# Patient Record
Sex: Male | Born: 1970 | Race: White | Hispanic: No | Marital: Married | Smoking: Current every day smoker
Health system: Southern US, Community
[De-identification: ages and names within clinical notes are randomized; demographics above are authoritative.]

## PROBLEM LIST (undated history)

## (undated) DIAGNOSIS — R7401 Elevation of levels of liver transaminase levels: Secondary | ICD-10-CM

## (undated) DIAGNOSIS — B1920 Unspecified viral hepatitis C without hepatic coma: Secondary | ICD-10-CM

## (undated) DIAGNOSIS — Z720 Tobacco use: Secondary | ICD-10-CM

## (undated) DIAGNOSIS — F1010 Alcohol abuse, uncomplicated: Secondary | ICD-10-CM

## (undated) DIAGNOSIS — R740 Nonspecific elevation of levels of transaminase and lactic acid dehydrogenase [LDH]: Secondary | ICD-10-CM

## (undated) DIAGNOSIS — R569 Unspecified convulsions: Secondary | ICD-10-CM

## (undated) HISTORY — PX: CARPAL TUNNEL RELEASE: SHX101

---

## 2007-05-11 ENCOUNTER — Emergency Department: Payer: Self-pay | Admitting: Emergency Medicine

## 2007-11-30 ENCOUNTER — Emergency Department: Payer: Self-pay | Admitting: Emergency Medicine

## 2011-06-09 ENCOUNTER — Emergency Department: Payer: Self-pay

## 2011-06-09 LAB — URINALYSIS, COMPLETE
Bacteria: NONE SEEN
Bilirubin,UR: NEGATIVE
Ketone: NEGATIVE
Ph: 5 (ref 4.5–8.0)
RBC,UR: 2 /HPF (ref 0–5)
Squamous Epithelial: NONE SEEN
WBC UR: <1 /HPF (ref 0–5)

## 2011-06-09 LAB — COMPREHENSIVE METABOLIC PANEL
Alkaline Phosphatase: 85 U/L (ref 50–136)
Anion Gap: 10 (ref 7–16)
Bilirubin,Total: 0.7 mg/dL (ref 0.2–1.0)
Calcium, Total: 8.6 mg/dL (ref 8.5–10.1)
Chloride: 103 mmol/L (ref 98–107)
Co2: 25 mmol/L (ref 21–32)
Creatinine: 0.93 mg/dL (ref 0.60–1.30)
EGFR (African American): >60
EGFR (Non-African Amer.): >60
Osmolality: 279 (ref 275–301)
SGPT (ALT): 58 U/L
Sodium: 138 mmol/L (ref 136–145)

## 2011-06-09 LAB — CBC
HCT: 36.7 % — ABNORMAL LOW (ref 40.0–52.0)
HGB: 12.9 g/dL — ABNORMAL LOW (ref 13.0–18.0)
MCH: 31.9 pg (ref 26.0–34.0)
MCHC: 35.2 g/dL (ref 32.0–36.0)
Platelet: 210 10*3/uL (ref 150–440)

## 2011-06-09 LAB — DRUG SCREEN, URINE
Amphetamines, Ur Screen: NEGATIVE (ref ?–1000)
Barbiturates, Ur Screen: NEGATIVE (ref ?–200)
Benzodiazepine, Ur Scrn: NEGATIVE (ref ?–200)
Cannabinoid 50 Ng, Ur ~~LOC~~: NEGATIVE (ref ?–50)
Methadone, Ur Screen: NEGATIVE (ref ?–300)
Opiate, Ur Screen: NEGATIVE (ref ?–300)
Phencyclidine (PCP) Ur S: NEGATIVE (ref ?–25)

## 2011-06-09 LAB — ETHANOL
Ethanol %: <0.003 % (ref 0.000–0.080)
Ethanol: <3 mg/dL

## 2011-08-30 ENCOUNTER — Emergency Department: Payer: Self-pay | Admitting: Emergency Medicine

## 2011-08-30 LAB — DRUG SCREEN, URINE
Amphetamines, Ur Screen: NEGATIVE (ref ?–1000)
Benzodiazepine, Ur Scrn: NEGATIVE (ref ?–200)
MDMA (Ecstasy)Ur Screen: NEGATIVE (ref ?–500)
Opiate, Ur Screen: NEGATIVE (ref ?–300)
Phencyclidine (PCP) Ur S: NEGATIVE (ref ?–25)
Tricyclic, Ur Screen: NEGATIVE (ref ?–1000)

## 2011-08-30 LAB — COMPREHENSIVE METABOLIC PANEL
Albumin: 4.2 g/dL (ref 3.4–5.0)
Anion Gap: 11 (ref 7–16)
Calcium, Total: 9.2 mg/dL (ref 8.5–10.1)
Co2: 19 mmol/L — ABNORMAL LOW (ref 21–32)
Creatinine: 0.85 mg/dL (ref 0.60–1.30)
Glucose: 123 mg/dL — ABNORMAL HIGH (ref 65–99)
Potassium: 4.1 mmol/L (ref 3.5–5.1)
SGPT (ALT): 80 U/L — ABNORMAL HIGH
Sodium: 133 mmol/L — ABNORMAL LOW (ref 136–145)
Total Protein: 8.8 g/dL — ABNORMAL HIGH (ref 6.4–8.2)

## 2011-08-30 LAB — CBC
HGB: 14.9 g/dL (ref 13.0–18.0)
MCH: 31.3 pg (ref 26.0–34.0)
MCHC: 34.7 g/dL (ref 32.0–36.0)
Platelet: 223 10*3/uL (ref 150–440)
WBC: 6.2 10*3/uL (ref 3.8–10.6)

## 2011-08-30 LAB — ETHANOL: Ethanol %: <0.003 % (ref 0.000–0.080)

## 2011-08-30 LAB — ACETAMINOPHEN LEVEL: Acetaminophen: <2 ug/mL

## 2011-08-30 LAB — TSH: Thyroid Stimulating Horm: 4.02 u[IU]/mL

## 2012-09-23 ENCOUNTER — Emergency Department: Payer: Self-pay | Admitting: Urology

## 2012-10-07 ENCOUNTER — Emergency Department: Payer: Self-pay | Admitting: Emergency Medicine

## 2012-10-13 ENCOUNTER — Emergency Department: Payer: Self-pay | Admitting: Emergency Medicine

## 2012-10-13 LAB — URINALYSIS, COMPLETE
Bacteria: NONE SEEN
Bilirubin,UR: NEGATIVE
Glucose,UR: NEGATIVE mg/dL (ref 0–75)
Ketone: NEGATIVE
Ph: 6 (ref 4.5–8.0)
Protein: NEGATIVE
RBC,UR: <1 /HPF (ref 0–5)

## 2012-10-13 LAB — COMPREHENSIVE METABOLIC PANEL
Alkaline Phosphatase: 144 U/L — ABNORMAL HIGH (ref 50–136)
Anion Gap: 9 (ref 7–16)
BUN: 10 mg/dL (ref 7–18)
Bilirubin,Total: 0.6 mg/dL (ref 0.2–1.0)
Chloride: 104 mmol/L (ref 98–107)
EGFR (Non-African Amer.): >60
Glucose: 121 mg/dL — ABNORMAL HIGH (ref 65–99)
Osmolality: 267 (ref 275–301)
Potassium: 5 mmol/L (ref 3.5–5.1)
SGOT(AST): 112 U/L — ABNORMAL HIGH (ref 15–37)
Sodium: 133 mmol/L — ABNORMAL LOW (ref 136–145)
Total Protein: 8.6 g/dL — ABNORMAL HIGH (ref 6.4–8.2)

## 2012-10-13 LAB — ETHANOL: Ethanol: 283 mg/dL

## 2012-10-13 LAB — DRUG SCREEN, URINE
Amphetamines, Ur Screen: NEGATIVE (ref ?–1000)
Benzodiazepine, Ur Scrn: NEGATIVE (ref ?–200)
Cocaine Metabolite,Ur ~~LOC~~: NEGATIVE (ref ?–300)
Methadone, Ur Screen: NEGATIVE (ref ?–300)
Opiate, Ur Screen: NEGATIVE (ref ?–300)
Phencyclidine (PCP) Ur S: NEGATIVE (ref ?–25)
Tricyclic, Ur Screen: NEGATIVE (ref ?–1000)

## 2012-10-13 LAB — CBC
HCT: 41.5 % (ref 40.0–52.0)
HGB: 14.8 g/dL (ref 13.0–18.0)
MCH: 31.5 pg (ref 26.0–34.0)
MCHC: 35.5 g/dL (ref 32.0–36.0)
Platelet: 244 10*3/uL (ref 150–440)
RBC: 4.69 10*6/uL (ref 4.40–5.90)
RDW: 13.7 % (ref 11.5–14.5)
WBC: 8.3 10*3/uL (ref 3.8–10.6)

## 2014-09-22 NOTE — Consult Note (Signed)
PATIENT NAME:  Joel Tanner, Joel Tanner MR#:  [id number] DATE OF BIRTH:  1971/02/16  DATE OF CONSULTATION:  06/09/2011  REFERRING PHYSICIAN:  Maurilio Lovely, MD  CONSULTING PHYSICIAN:  Jackquline Branca A. Lafayette Dragon, MD PRIMARY CARE PHYSICIAN: None.   REASON FOR CONSULTATION: Transient confusion and some tingling for 2 seconds.   HISTORY OF PRESENT ILLNESS: The patient is a 44 year old white male with a past medical history of alcohol abuse. He drinks 6 to 12 beers a night. He last drank two days ago. Yesterday at around 1:30 p.m., he woke up and had slight confusion when he woke up for a few minutes and then subsequently had a little bit of tingling in the back of his head and the back of his leg for about 2 seconds. He drank 12 beers on 06/07/2011, commonly drinks about a sixpack. He had no alcohol yesterday or today.  He was brought in by his mother. He had mildly elevated blood pressure. He was given aspirin, had a head CT. We are asked to see the patient for possible transient ischemic attacks.   PAST MEDICAL HISTORY: None.   PAST SURGICAL HISTORY: Carpal tunnel repair on the right hand.   HOME MEDICATIONS:  None.   ALLERGIES: No known drug allergies.   FAMILY HISTORY: Both his mother and father are living.   SOCIAL HISTORY: He is single, but he has two healthy children. He drinks 6 to 12 beers a day. He smokes about 1 pack of cigarettes a day. He is unemployed, used to be a Engineer, petroleum.   REVIEW OF SYSTEMS: CONSTITUTIONAL: No fever, fatigue, weakness, or pain. EYES: No blurred vision, double vision, pain, redness, inflammation, glaucoma. ENT: No tinnitus, ear pain, hearing loss, seasonal allergies, epistaxis, or discharge. RESPIRATORY: No cough, wheezing, hemoptysis, dyspnea, asthma, painful respirations. CARDIOVASCULAR: No chest pain, orthopnea, edema, arrhythmia, dyspnea on exertion, palpitations. GI: No nausea, vomiting, diarrhea, abdominal pain. No hematemesis, melena, or GERD.  GU: No dysuria, hematuria, renal calculi, increased frequency, or incontinence. GU male. No sores, discharge, prostatitis or erectile dysfunction. ENDOCRINE: No polyuria, nocturia, thyroid problems, increased sweating, heat or cold intolerance. HEME/LYMPH: No anemia, easy bruising, or swollen glands. INTEGUMENT: No acne, rash, lesions, change in mole, hair or skin. MUSCULOSKELETAL: No pain in back, shoulder, knee, hip, arthritis, swelling, gout. NEUROLOGIC: No numbness, weakness, dysarthria, epilepsy, tremor, vertigo, ataxia. PSYCH: No anxiety, insomnia, ADD, bipolar, or depression.   PHYSICAL EXAMINATION:  VITAL SIGNS: Temperature 97.7, heart rate 80, respiratory rate 18, sating 98% on room air, blood pressure 142/90.   GENERAL: The patient is well-developed, well-nourished, in no apparent distress, alert and oriented x3.   HEENT: Pupils are equal, round and reactive to light and accommodation. Extraocular movements are intact. Anicteric sclerae. No difficulty hearing. Oropharynx is clear.   NECK: No JVD. No thyromegaly, no lymphadenopathy. No carotid bruits.   LUNGS: Clear to auscultation. No adventitious breath sounds. No increased work of breathing or use of accessory muscles.   CARDIOVASCULAR: Regular rate and rhythm. Normal S1, S2. No murmurs, gallops, rubs are appreciated.   ABDOMEN: Soft, nontender, nondistended. Positive bowel sounds. No masses.    GU: Deferred.   MUSCULOSKELETAL: Strength five out of five. No clubbing, cyanosis or degenerative joint disease.   SKIN: No rashes, lesions, or induration. He has multiple tattoos.   LYMPH: No lymphadenopathy in the cervical or supraclavicular area.   NEUROLOGICAL: Cranial nerves II through XII are intact. Reflexes +2. Gait steady.   PSYCHIATRIC: Alert and  oriented x3 with flattened affect.   LABORATORY, DIAGNOSTIC AND RADIOLOGICAL DATA:  Glucose 129, BUN 18, creatinine 0.93 sodium 138, potassium 4.2, chloride 103, bicarbonate 25,  anion gap 10. Alcohol level less than 3.0. Total protein 7.6, albumin 3.8, total bilirubin 0.7, alkaline phosphatase 85, ALT 73, AST 58.  WBC 5.2, hemoglobin 12.9, hematocrit 36.7, platelets 210.  Urinalysis shows bacteria none, WBC less than 1.0.   Drug screen negative.   ASSESSMENT AND RECOMMENDATIONS: The patient is a 44 year old white male with past medical history of alcohol abuse who presents with confusion, some transient numbness which occurred yesterday.   1. Confusion, tingling: Doubt this is a transient ischemic attack, but we will get carotid ultrasound. The patient did not want to stay for admission. We will refer to [facility]. We will get carotid ultrasound. He could get echo done as an outpatient. I don't think he warrants MRI at this point.  2. Hypertension: This could be just white coat phenomenon. I would ask [facility] to re-evaluate his blood pressure. I have [facility].  3. Hyperglycemia: This is again probably just stress reaction.  4. Elevated AST: This is probably from chronic alcohol abuse. I counseled him about quitting.  5. Alcohol abuse: I counseled about cessation. The patient does not want to quit at this time.  6. Smoking: The patient was counseled about cessation for three minutes.  7. Anemia: We will monitor.  8. Deep venous thrombosis prophylaxis:  The patient was ambulatory.  9. Disposition: The patient was seen by Case Management and Utilization Review, set up for [facility]. He will be written for aspirin until he sees the [facility].   TOTAL TIME SPENT ON  CONSULTATION: 60 minutes.   Thank you for allowing me to participate in the care of this patient.  ____________________________ Corie ChiquitoAmir A. Lafayette DragonFirozvi, MD aaf:cbb D: 06/09/2011 21:37:51 ET T: 06/10/2011 09:36:18 ET JOB#: [id number] cc: [facility] Coralyn PearAMIR A Londa Mackowski MD ELECTRONICALLY SIGNED 06/11/2011 17:14

## 2014-09-22 NOTE — Consult Note (Signed)
PATIENT NAME:  Joel Tanner, Joel Tanner MR#:  [id number] DATE OF BIRTH:  25-Sep-1970  DATE OF CONSULTATION:  08/30/2011  REFERRING PHYSICIAN:  Dr. Margarita GrizzleWoodruff  CONSULTING PHYSICIAN:  Venida JarvisWilliam James Rashonda Warrior II, MD  REASON FOR CONSULTATION: Consult requested by Dr. Margarita GrizzleWoodruff for what sounds like a confused state.   CHIEF COMPLAINT: "I drank too much yesterday."   HISTORY OF PRESENT ILLNESS: Patient is a 44 year old white single male who early this morning woke up and vomited, started flopping around on the bed, tore it up, urinated on the kitchen floor, stared at the fiancee who he lives with making strange noises with his tongue, would not respond and basically would not wake up. Since coming to the Emergency Room he was initially combative, was brought here by the police and now has recovered from the intoxication and is no longer combative. He reports to me drinking a sixpack of beer per day but yesterday he drank more. Only other medication he takes is melatonin at night. He reports he does have tremors which are a little bit increased in coming off of alcohol but has what sounds like a family tremor all the time but no history of DTs or actual seizures. Patient has no history of depression. He has been to AA in the past with previous detox attempts. He does not want inpatient detox at this time.   No significant medical problems at present. He has been in jail before for not paying child support. He has had one DWI in the past. Uses marijuana about once a month. No other drug history.   Patient has no history of depression.   SOCIAL HISTORY: He lives with his girlfriend. He does work as a Nutritional therapistplumber. He does smoke cigarettes.   Patient again denies suicidal thoughts or history of suicide.   SUICIDE RISK ASSESSMENT: Current attempt negative. Impulsivity positive. Ideation/plan negative. Panic negative. High anxiety terminal negative. Anhedonia negative. Concentration negative. Hopelessness negative. Insomnia  negative. Energy negative. Treatment alliance negative. Illness/pain negative. Prior attempts negative. Substance abuse positive. Family history negative. Childhood abuse negative. Marital status positive. Family/children negative. Firearms: Patient does have a rifle that he hunts with and that is all. Age greater than 65 negative. Gender male. Short-term risk is low.   DIAGNOSES:  AXIS I:  1. Ethanol dependence with delirium secondary to excess alcohol intake on one day. Delirium is now cleared.  2. Alcohol dependence.   AXIS III: Some potential for alcohol withdrawal state.   RECOMMENDATIONS: Patient does not desire any inpatient treatment for withdrawal. He has been through it before on an outpatient basis. Does report that he may go back to AA but really has no desire to stop drinking but realized the excess drinking caused the recent confused state.   PLAN: To release the patient. I would keep him here for 2 or 3 more hours give him Librium if he wants to and give him about 425 mg Librium on discharge to take over the next two days.   ____________________________ Venida JarvisWilliam James Regis Wiland II, MD wjr:cms D: 08/30/2011 13:03:12 ET T: 08/30/2011 13:41:29 ET JOB#: [id number]  cc: Venida JarvisWilliam James Ether Goebel II, MD, <Dictator> Jules HusbandsWILLIAM J Tawania Daponte MD ELECTRONICALLY SIGNED 09/02/2011 14:51

## 2015-07-22 ENCOUNTER — Emergency Department: Payer: Self-pay

## 2015-07-22 ENCOUNTER — Inpatient Hospital Stay
Admission: EM | Admit: 2015-07-22 | Discharge: 2015-07-23 | DRG: 443 | Disposition: A | Discharge status: Home / Self Care | Payer: Self-pay | Attending: Internal Medicine | Admitting: Internal Medicine

## 2015-07-22 ENCOUNTER — Encounter: Payer: Self-pay | Admitting: Emergency Medicine

## 2015-07-22 DIAGNOSIS — K7682 Hepatic encephalopathy: Secondary | ICD-10-CM | POA: Diagnosis present

## 2015-07-22 DIAGNOSIS — K7290 Hepatic failure, unspecified without coma: Secondary | ICD-10-CM | POA: Diagnosis present

## 2015-07-22 DIAGNOSIS — F1010 Alcohol abuse, uncomplicated: Secondary | ICD-10-CM | POA: Diagnosis present

## 2015-07-22 DIAGNOSIS — Z9889 Other specified postprocedural states: Secondary | ICD-10-CM

## 2015-07-22 DIAGNOSIS — K7200 Acute and subacute hepatic failure without coma: Principal | ICD-10-CM | POA: Diagnosis present

## 2015-07-22 DIAGNOSIS — F17210 Nicotine dependence, cigarettes, uncomplicated: Secondary | ICD-10-CM | POA: Diagnosis present

## 2015-07-22 DIAGNOSIS — Z79899 Other long term (current) drug therapy: Secondary | ICD-10-CM

## 2015-07-22 DIAGNOSIS — B1920 Unspecified viral hepatitis C without hepatic coma: Secondary | ICD-10-CM | POA: Diagnosis present

## 2015-07-22 HISTORY — DX: Unspecified convulsions: R56.9

## 2015-07-22 HISTORY — DX: Alcohol abuse, uncomplicated: F10.10

## 2015-07-22 HISTORY — DX: Unspecified viral hepatitis C without hepatic coma: B19.20

## 2015-07-22 HISTORY — DX: Nonspecific elevation of levels of transaminase and lactic acid dehydrogenase (ldh): R74.0

## 2015-07-22 HISTORY — DX: Tobacco use: Z72.0

## 2015-07-22 HISTORY — DX: Elevation of levels of liver transaminase levels: R74.01

## 2015-07-22 LAB — URINE DRUG SCREEN, QUALITATIVE (ARMC ONLY)
AMPHETAMINES, UR SCREEN: NOT DETECTED
Barbiturates, Ur Screen: NOT DETECTED
Benzodiazepine, Ur Scrn: NOT DETECTED
COCAINE METABOLITE, UR ~~LOC~~: NOT DETECTED
Cannabinoid 50 Ng, Ur ~~LOC~~: NOT DETECTED
MDMA (ECSTASY) UR SCREEN: NOT DETECTED
METHADONE SCREEN, URINE: NOT DETECTED
OPIATE, UR SCREEN: NOT DETECTED
PHENCYCLIDINE (PCP) UR S: NOT DETECTED
Tricyclic, Ur Screen: NOT DETECTED

## 2015-07-22 LAB — COMPREHENSIVE METABOLIC PANEL
ALBUMIN: 4.1 g/dL (ref 3.5–5.0)
ALT: 70 U/L — ABNORMAL HIGH (ref 17–63)
ANION GAP: 10 (ref 5–15)
AST: 68 U/L — ABNORMAL HIGH (ref 15–41)
Alkaline Phosphatase: 74 U/L (ref 38–126)
BILIRUBIN TOTAL: 0.7 mg/dL (ref 0.3–1.2)
BUN: 16 mg/dL (ref 6–20)
CO2: 19 mmol/L — ABNORMAL LOW (ref 22–32)
Calcium: 9.3 mg/dL (ref 8.9–10.3)
Chloride: 108 mmol/L (ref 101–111)
Creatinine, Ser: 0.71 mg/dL (ref 0.61–1.24)
GFR calc Af Amer: >60 mL/min (ref >60)
Glucose, Bld: 106 mg/dL — ABNORMAL HIGH (ref 65–99)
POTASSIUM: 3.9 mmol/L (ref 3.5–5.1)
Sodium: 137 mmol/L (ref 135–145)
TOTAL PROTEIN: 8 g/dL (ref 6.5–8.1)

## 2015-07-22 LAB — URINALYSIS COMPLETE WITH MICROSCOPIC (ARMC ONLY)
BACTERIA UA: NONE SEEN
Bilirubin Urine: NEGATIVE
Glucose, UA: NEGATIVE mg/dL
HGB URINE DIPSTICK: NEGATIVE
KETONES UR: NEGATIVE mg/dL
Leukocytes, UA: NEGATIVE
NITRITE: NEGATIVE
PH: 6 (ref 5.0–8.0)
PROTEIN: NEGATIVE mg/dL
RBC / HPF: NONE SEEN RBC/hpf (ref 0–5)
SPECIFIC GRAVITY, URINE: 1.014 (ref 1.005–1.030)
WBC UA: NONE SEEN WBC/hpf (ref 0–5)

## 2015-07-22 LAB — GLUCOSE, CAPILLARY: GLUCOSE-CAPILLARY: 114 mg/dL — AB (ref 65–99)

## 2015-07-22 LAB — CBC
HCT: 36.9 % — ABNORMAL LOW (ref 40.0–52.0)
HEMOGLOBIN: 13 g/dL (ref 13.0–18.0)
MCH: 31.8 pg (ref 26.0–34.0)
MCHC: 35.1 g/dL (ref 32.0–36.0)
MCV: 90.3 fL (ref 80.0–100.0)
PLATELETS: 186 10*3/uL (ref 150–440)
RBC: 4.08 MIL/uL — AB (ref 4.40–5.90)
RDW: 14.7 % — ABNORMAL HIGH (ref 11.5–14.5)
WBC: 5.1 10*3/uL (ref 3.8–10.6)

## 2015-07-22 LAB — RAPID HIV SCREEN (HIV 1/2 AB+AG)
HIV 1/2 ANTIBODIES: NONREACTIVE
HIV-1 P24 ANTIGEN - HIV24: NONREACTIVE

## 2015-07-22 LAB — PROTIME-INR
INR: 1.29
PROTHROMBIN TIME: 16.2 s — AB (ref 11.4–15.0)

## 2015-07-22 LAB — TSH: TSH: 2.242 u[IU]/mL (ref 0.350–4.500)

## 2015-07-22 LAB — ACETAMINOPHEN LEVEL: Acetaminophen (Tylenol), Serum: <10 ug/mL — ABNORMAL LOW (ref 10–30)

## 2015-07-22 LAB — AMMONIA: Ammonia: 136 umol/L — ABNORMAL HIGH (ref 9–35)

## 2015-07-22 LAB — SALICYLATE LEVEL

## 2015-07-22 LAB — ETHANOL: Alcohol, Ethyl (B): 14 mg/dL — ABNORMAL HIGH (ref <5)

## 2015-07-22 MED ORDER — LACTULOSE 10 GM/15ML PO SOLN
30.0000 g | Freq: Once | ORAL | Status: AC
  Administered 2015-07-22: 30 g via ORAL
  Filled 2015-07-22: qty 60

## 2015-07-22 MED ORDER — ONDANSETRON HCL 4 MG/2ML IJ SOLN: 4.0000 mg | Freq: Four times a day (QID) | INTRAMUSCULAR | Status: DC | PRN

## 2015-07-22 MED ORDER — NICOTINE 21 MG/24HR TD PT24
21.0000 mg | MEDICATED_PATCH | Freq: Every day | TRANSDERMAL | Status: DC
  Administered 2015-07-22 – 2015-07-23 (×2): 21 mg via TRANSDERMAL
  Filled 2015-07-22 (×2): qty 1

## 2015-07-22 MED ORDER — LORAZEPAM 1 MG PO TABS: 1.0000 mg | ORAL_TABLET | Freq: Four times a day (QID) | ORAL | Status: DC | PRN

## 2015-07-22 MED ORDER — ACETAMINOPHEN 650 MG RE SUPP: 650.0000 mg | Freq: Four times a day (QID) | RECTAL | Status: DC | PRN

## 2015-07-22 MED ORDER — HEPARIN SODIUM (PORCINE) 5000 UNIT/ML IJ SOLN
5000.0000 [IU] | Freq: Three times a day (TID) | INTRAMUSCULAR | Status: DC
  Administered 2015-07-22 – 2015-07-23 (×3): 5000 [IU] via SUBCUTANEOUS
  Filled 2015-07-22 (×3): qty 1

## 2015-07-22 MED ORDER — SODIUM CHLORIDE 0.9 % IV BOLUS (SEPSIS)
1000.0000 mL | Freq: Once | INTRAVENOUS | Status: AC
Start: 2015-07-22 — End: 2015-07-22
  Administered 2015-07-22: 1000 mL via INTRAVENOUS

## 2015-07-22 MED ORDER — LORAZEPAM 2 MG PO TABS: 0.0000 mg | ORAL_TABLET | Freq: Two times a day (BID) | ORAL | Status: DC

## 2015-07-22 MED ORDER — VITAMIN B-1 100 MG PO TABS
100.0000 mg | ORAL_TABLET | Freq: Every day | ORAL | Status: DC
  Administered 2015-07-22: 100 mg via ORAL
  Filled 2015-07-22 (×2): qty 1

## 2015-07-22 MED ORDER — ALUM & MAG HYDROXIDE-SIMETH 200-200-20 MG/5ML PO SUSP: 30.0000 mL | Freq: Four times a day (QID) | ORAL | Status: DC | PRN

## 2015-07-22 MED ORDER — THIAMINE HCL 100 MG/ML IJ SOLN: 100.0000 mg | Freq: Every day | INTRAMUSCULAR | Status: DC

## 2015-07-22 MED ORDER — LACTULOSE 10 GM/15ML PO SOLN
30.0000 g | Freq: Three times a day (TID) | ORAL | Status: DC
  Administered 2015-07-22 – 2015-07-23 (×3): 30 g via ORAL
  Filled 2015-07-22 (×3): qty 60

## 2015-07-22 MED ORDER — LORAZEPAM 2 MG/ML IJ SOLN: 1.0000 mg | Freq: Four times a day (QID) | INTRAMUSCULAR | Status: DC | PRN

## 2015-07-22 MED ORDER — ACETAMINOPHEN 325 MG PO TABS: 650.0000 mg | ORAL_TABLET | Freq: Four times a day (QID) | ORAL | Status: DC | PRN

## 2015-07-22 MED ORDER — ADULT MULTIVITAMIN W/MINERALS CH
1.0000 | ORAL_TABLET | Freq: Every day | ORAL | Status: DC
  Administered 2015-07-22 – 2015-07-23 (×2): 1 via ORAL
  Filled 2015-07-22 (×2): qty 1

## 2015-07-22 MED ORDER — PNEUMOCOCCAL VAC POLYVALENT 25 MCG/0.5ML IJ INJ
0.5000 mL | INJECTION | INTRAMUSCULAR | Status: AC
  Administered 2015-07-23: 0.5 mL via INTRAMUSCULAR
  Filled 2015-07-22: qty 0.5

## 2015-07-22 MED ORDER — LORAZEPAM 2 MG PO TABS: 0.0000 mg | ORAL_TABLET | Freq: Four times a day (QID) | ORAL | Status: DC

## 2015-07-22 MED ORDER — ONDANSETRON HCL 4 MG PO TABS: 4.0000 mg | ORAL_TABLET | Freq: Four times a day (QID) | ORAL | Status: DC | PRN

## 2015-07-22 MED ORDER — FOLIC ACID 1 MG PO TABS
1.0000 mg | ORAL_TABLET | Freq: Every day | ORAL | Status: DC
  Administered 2015-07-22 – 2015-07-23 (×2): 1 mg via ORAL
  Filled 2015-07-22 (×2): qty 1

## 2015-07-22 MED ORDER — PANTOPRAZOLE SODIUM 40 MG PO TBEC
40.0000 mg | DELAYED_RELEASE_TABLET | Freq: Every day | ORAL | Status: DC
  Administered 2015-07-22 – 2015-07-23 (×2): 40 mg via ORAL
  Filled 2015-07-22 (×2): qty 1

## 2015-07-22 MED ORDER — SENNOSIDES-DOCUSATE SODIUM 8.6-50 MG PO TABS: 1.0000 | ORAL_TABLET | Freq: Every evening | ORAL | Status: DC | PRN

## 2015-07-22 NOTE — Care Management Note (Addendum)
Case Management Note  Patient Details  Name: Joel BLATZ Sr. MRN: [id number] Date of Birth: 12-16-1970  Subjective/Objective:      44yo uninsured Joel Joel Tanner was admitted 07/22/15 with AMS after arriving in the [facility] via EMS. Ammonia level=136, ETOH=283, positive for cannabis. Admits to drinking a 6 pack of beer daily.Hx: Hepatitis C and seizures. CIWA initiated. Case manager will follow up with Joel Tanner for discharge planning tomorrow when he is more alert.              Action/Plan:   Expected Discharge Date:  07/24/15               Expected Discharge Plan:     In-House Referral:     Discharge planning Services     Post Acute Care Choice:    Choice offered to:     DME Arranged:    DME Agency:     HH Arranged:    HH Agency:     Status of Service:     Medicare Important Message Given:    Date Medicare IM Given:    Medicare IM give by:    Date Additional Medicare IM Given:    Additional Medicare Important Message give by:     If discussed at Long Length of Stay Meetings, dates discussed:    Additional Comments:  Shayden Bobier A, RN 07/22/2015, 3:58 PM

## 2015-07-22 NOTE — ED Provider Notes (Signed)
CSN: [id number]     Arrival date & time 07/22/15  1041 History   First MD Initiated Contact with Patient 07/22/15 1145     Chief Complaint  Patient presents with  . Altered Mental Status     (Consider location/radiation/quality/duration/timing/severity/associated sxs/prior Treatment) The history is provided by the patient.  Decarlo Galen Daft Sr. is a 45 y.o. male hx of hep C, seizures not on meds here with confusion. Patient drinks about a sixpack of beer every day. Alcohol use was yesterday. He was supposed to go to work this morning and put on the wrong set of shoes and clothes. He was at work and appears confused and doesn't know what's going on. Patient denies vomiting or fevers. Denies IV drug use, just drinks alcohol daily. Has elevated LFTs but suppose to get workup at [facility]       Past Medical History  Diagnosis Date  . Hepatitis C   . Elevated transaminase level   . Chronic alcohol abuse   . Seizure (HCC)   . Tobacco use    Past Surgical History  Procedure Laterality Date  . Carpal tunnel release     No family history on file. Social History  Substance Use Topics  . Smoking status: Current Every Day Smoker -- 6.00 packs/day    Types: Cigars  . Smokeless tobacco: None  . Alcohol Use: 3.6 oz/week    6 Cans of beer per week     Comment: 6 beers daily    Review of Systems  Psychiatric/Behavioral: Positive for confusion.  All other systems reviewed and are negative.     Allergies  Review of patient's allergies indicates no known allergies.  Home Medications   Prior to Admission medications   Not on File   BP 142/94 mmHg  Pulse 89  Temp(Src) 98.2 F (36.8 C) (Oral)  Resp 20  Ht  (1.753 m)  Wt 160 lb (72.576 kg)  BMI 23.62 kg/m2  SpO2 100% Physical Exam  Constitutional: He is oriented to person, place, and time. He appears well-developed and well-nourished.  Slightly confused   HENT:  Head: Normocephalic.  Mouth/Throat: Oropharynx is clear  and moist.  Eyes: Conjunctivae are normal. Pupils are equal, round, and reactive to light.  Neck: Normal range of motion. Neck supple.  Cardiovascular: Normal rate, regular rhythm and normal heart sounds.   Pulmonary/Chest: Effort normal and breath sounds normal. No respiratory distress. He has no wheezes. He has no rales.  Abdominal: Soft. Bowel sounds are normal. He exhibits no distension. There is no tenderness. There is no rebound.  Musculoskeletal: Normal range of motion. He exhibits no edema or tenderness.  Neurological: He is alert and oriented to person, place, and time.  ? Mild tremors vs mild asterixis. Nl strength throughout.   Skin: Skin is warm and dry.  Psychiatric: He has a normal mood and affect. His behavior is normal. Judgment and thought content normal.  Nursing note and vitals reviewed.   ED Course  Procedures (including critical care time) Labs Review Labs Reviewed  COMPREHENSIVE METABOLIC PANEL - Abnormal; Notable for the following:    CO2 19 (*)    Glucose, Bld 106 (*)    AST 68 (*)    ALT 70 (*)    All other components within normal limits  CBC - Abnormal; Notable for the following:    RBC 4.08 (*)    HCT 36.9 (*)    RDW 14.7 (*)    All other components  within normal limits  GLUCOSE, CAPILLARY - Abnormal; Notable for the following:    Glucose-Capillary 114 (*)    All other components within normal limits  AMMONIA - Abnormal; Notable for the following:    Ammonia 136 (*)    All other components within normal limits  URINALYSIS COMPLETEWITH MICROSCOPIC ([facility] ONLY) - Abnormal; Notable for the following:    Color, Urine YELLOW (*)    APPearance CLEAR (*)    Squamous Epithelial / LPF 0-5 (*)    All other components within normal limits  PROTIME-INR - Abnormal; Notable for the following:    Prothrombin Time 16.2 (*)    All other components within normal limits  URINE DRUG SCREEN, QUALITATIVE ([facility] ONLY)  ETHANOL  SALICYLATE LEVEL  ACETAMINOPHEN LEVEL   CBG MONITORING, ED    Imaging Review Ct Head Wo Contrast  07/22/2015  CLINICAL DATA:  Confusion, altered mental status since this morning when patient to work, last seen normal yesterday, history smoking, hepatitis-C, chronic alcohol abuse EXAM: CT HEAD WITHOUT CONTRAST TECHNIQUE: Contiguous axial images were obtained from the base of the skull through the vertex without intravenous contrast. COMPARISON:  For 06/2011 FINDINGS: Generalized atrophy. Normal ventricular morphology. No midline shift or mass effect. Normal appearance of brain parenchyma. No intracranial hemorrhage, mass lesion, or evidence acute infarction. No extra-axial fluid collections. Mucosal thickening throughout the ethmoid air cells and at the bases of the frontal sinuses. Bones unremarkable. IMPRESSION: No acute intracranial abnormalities. Sinus disease changes as above. Electronically Signed   By: Ulyses Southward M.D.   On: 07/22/2015 12:48   I have personally reviewed and evaluated these images and lab results as part of my medical decision-making.   EKG Interpretation None       ED ECG REPORT I, Danaysha Kirn, the attending physician, personally viewed and interpreted this ECG.   Date: 07/22/2015  EKG Time: 11:15  Rate: 97  Rhythm: normal EKG, normal sinus rhythm  Axis: normal  Intervals:none  ST&T Change: none   MDM   Final diagnoses:  None    Latwan P Solly Sr. is a 45 y.o. male here with confusion. Has hx of elevated LFTs and patient drinks alcohol. Denies other drug use. Vitals stable. Appears to have mild asterxis vs tremors. Will check tox, LFTs, ammonia. Will get CT head.   1:02 PM CT head nl. Ammonia 136. LFTs 60s. Bicarb 19 nl AG. Given lactulose. Will admit.    Richardean Canal, MD 07/22/15 [id number]

## 2015-07-22 NOTE — ED Notes (Signed)
Patient reports intermittent nose bleeds as well.  schaevitz informed.  PTINR informed.

## 2015-07-22 NOTE — ED Notes (Signed)
No droop, drift, or slurred speech noted.  Patient is tremulous.

## 2015-07-22 NOTE — H&P (Signed)
[facility] [place] [facility]   PATIENT NAME: Joel Tanner    MR#:  [id number]  DATE OF BIRTH:  [birth year]  DATE OF ADMISSION:  07/22/2015  PRIMARY CARE PHYSICIAN: No primary care provider on file.   REQUESTING/REFERRING PHYSICIAN: Dr Silverio Lay  CHIEF COMPLAINT:   Confusion HISTORY OF PRESENT ILLNESS:  Joel Tanner  is a 45 y.o. male with a known history of EtOH and tobacco abuse and hepatitis C who presents to above complaint. Patient reports that work he is feeling very confused. He says he was in normal state of health yesterday. He says his last drink was yesterday. He drinks daily 6 cans of beer. He was referred to [facility] for lab work regarding hepatitis C but he states he is not going to follow-up. Ammonia levels greater than 100 here in the emergency room.  PAST MEDICAL HISTORY:   Past Medical History  Diagnosis Date  . Hepatitis C   . Elevated transaminase level   . Chronic alcohol abuse   . Seizure (HCC)   . Tobacco use     PAST SURGICAL HISTORY:   Past Surgical History  Procedure Laterality Date  . Carpal tunnel release      SOCIAL HISTORY:   Social History  Substance Use Topics  . Smoking status: Current Every Day Smoker -- 6.00 packs/day    Types: Cigars  . Smokeless tobacco: No  . Alcohol Use: 3.6 oz/week    6 Cans of beer per week     Comment: 6 beers daily    FAMILY HISTORY:  No history of hypertension or CAD  DRUG ALLERGIES:  No Known Allergies   REVIEW OF SYSTEMS:  CONSTITUTIONAL: No fever, fatigue or weakness.  EYES: No blurred or double vision.  EARS, NOSE, AND THROAT: No tinnitus or ear pain.  RESPIRATORY: No cough, shortness of breath, wheezing or hemoptysis.  CARDIOVASCULAR: No chest pain, orthopnea, edema.  GASTROINTESTINAL: No nausea, vomiting, diarrhea or abdominal pain.  GENITOURINARY: No dysuria, hematuria.  ENDOCRINE: No polyuria, nocturia,  HEMATOLOGY: No anemia, easy bruising or bleeding SKIN: No rash  or lesion. MUSCULOSKELETAL: No joint pain or arthritis.   NEUROLOGIC: No tingling, numbness, weakness. Positive confusion PSYCHIATRY: No anxiety or depression.   MEDICATIONS AT HOME:   Prior to Admission medications   Medication Sig Start Date End Date Taking? Authorizing Provider  omeprazole (PRILOSEC) 20 MG capsule Take 20 mg by mouth daily as needed.   Yes Historical Provider, MD      VITAL SIGNS:  Blood pressure 142/94, pulse 89, temperature 98.2 F (36.8 C), temperature source Oral, resp. rate 20, height  (1.753 m), weight 72.576 kg (160 lb), SpO2 100 %.  PHYSICAL EXAMINATION:  GENERAL:  45 y.o.-year-old patient lying in the bed with no acute distress.  EYES: Pupils equal, round, reactive to light and accommodation. No scleral icterus. Extraocular muscles intact.  HEENT: Head atraumatic, normocephalic. Oropharynx and nasopharynx clear.  NECK:  Supple, no jugular venous distention. No thyroid enlargement, no tenderness.  LUNGS: Normal breath sounds bilaterally, no wheezing, rales,rhonchi or crepitation. No use of accessory muscles of respiration.  CARDIOVASCULAR: S1, S2 normal. No murmurs, rubs, or gallops.  ABDOMEN: Soft, nontender, nondistended. Bowel sounds present. No organomegaly or mass.  EXTREMITIES: No pedal edema, cyanosis, or clubbing.  NEUROLOGIC: Cranial nerves II through XII are grossly intact. No focal deficits. PSYCHIATRIC: The patient is alert and oriented x name and place SKIN: No obvious rash, lesion, or ulcer.   LABORATORY  PANEL:   CBC  Recent Labs Lab 07/22/15 1121  WBC 5.1  HGB 13.0  HCT 36.9*  PLT 186   ------------------------------------------------------------------------------------------------------------------  Chemistries   Recent Labs Lab 07/22/15 1121  NA 137  K 3.9  CL 108  CO2 19*  GLUCOSE 106*  BUN 16  CREATININE 0.71  CALCIUM 9.3  AST 68*  ALT 70*  ALKPHOS 74  BILITOT 0.7    ------------------------------------------------------------------------------------------------------------------  Cardiac Enzymes No results for input(s): TROPONINI in the last 168 hours. ------------------------------------------------------------------------------------------------------------------  RADIOLOGY:  Ct Head Wo Contrast  07/22/2015  CLINICAL DATA:  Confusion, altered mental status since this morning when patient to work, last seen normal yesterday, history smoking, hepatitis-C, chronic alcohol abuse EXAM: CT HEAD WITHOUT CONTRAST TECHNIQUE: Contiguous axial images were obtained from the base of the skull through the vertex without intravenous contrast. COMPARISON:  For 06/2011 FINDINGS: Generalized atrophy. Normal ventricular morphology. No midline shift or mass effect. Normal appearance of brain parenchyma. No intracranial hemorrhage, mass lesion, or evidence acute infarction. No extra-axial fluid collections. Mucosal thickening throughout the ethmoid air cells and at the bases of the frontal sinuses. Bones unremarkable. IMPRESSION: No acute intracranial abnormalities. Sinus disease changes as above. Electronically Signed   By: Ulyses Southward M.D.   On: 07/22/2015 12:48    EKG:    sinus rhythm no ST elevation or depression  IMPRESSION AND PLAN:    45 year old male with a history of EtOH, tobacco abuse and hepatitis C who presents with acute encephalopathy.  1. Acute hepatic encephalopathy: Ammonia levels greater than 100. Patient will need to be treated with lactulose. Ammonia level for a.m.   2. History of hepatitis C: We'll order HCV RNA. Hepatitis panel. Patient will need outpatient GI follow-up after discharge. Also we'll check HIV.  3. Tobacco dependent: Patient is encouraged to stop smoking. Patient counseled for 4 minutes. Patient is 17. Get it sent. Patient would like a nicotine patch.  4. EtOH abuse: Patient is also counseled on cutting down on all call and then  eventually abstinence. CW protocol has been ordered.   All the records are reviewed and case discussed with ED provider. Management plans discussed with the patient and he is in agreement.  CODE STATUS: FULL  TOTAL TIME TAKING CARE OF THIS PATIENT: 45 minutes.    Kegan Mckeithan M.D on 07/22/2015 at 1:31 PM  Between 7am to 6pm - Pager - [phone] After 6pm go to [email or web address] - password [email or web address] [facility]  [place] [facility]  Office  [phone]  CC: Primary care physician; No primary care provider on file.

## 2015-07-22 NOTE — ED Notes (Signed)
Per EMS:  Confusion, altered mental status since this am when patient went to work this am, last seen normal yesterday.  Hx hep c and elevated liver enzymes, chronic daily etoh use.

## 2015-07-23 LAB — COMPREHENSIVE METABOLIC PANEL
ALBUMIN: 3.4 g/dL — AB (ref 3.5–5.0)
ALK PHOS: 69 U/L (ref 38–126)
ALT: 59 U/L (ref 17–63)
AST: 74 U/L — AB (ref 15–41)
BILIRUBIN TOTAL: 0.7 mg/dL (ref 0.3–1.2)
BUN: 18 mg/dL (ref 6–20)
CALCIUM: 8.6 mg/dL — AB (ref 8.9–10.3)
CO2: 18 mmol/L — AB (ref 22–32)
Chloride: 111 mmol/L (ref 101–111)
Creatinine, Ser: 0.8 mg/dL (ref 0.61–1.24)
GFR calc Af Amer: >60 mL/min (ref >60)
GFR calc non Af Amer: >60 mL/min (ref >60)
GLUCOSE: 94 mg/dL (ref 65–99)
Potassium: 3.4 mmol/L — ABNORMAL LOW (ref 3.5–5.1)
SODIUM: UNDETERMINED mmol/L (ref 135–145)
TOTAL PROTEIN: UNDETERMINED g/dL (ref 6.5–8.1)

## 2015-07-23 LAB — HCV RNA QUANT: HCV Quantitative: NOT DETECTED IU/mL (ref >50)

## 2015-07-23 LAB — HEPATITIS PANEL, ACUTE
HCV Ab: 11 s/co ratio — ABNORMAL HIGH (ref 0.0–0.9)
HEP B S AG: NEGATIVE
Hep A IgM: NEGATIVE
Hep B C IgM: NEGATIVE

## 2015-07-23 LAB — AMMONIA: AMMONIA: 63 umol/L — AB (ref 9–35)

## 2015-07-23 MED ORDER — LACTULOSE 10 GM/15ML PO SOLN: 30.0000 g | Freq: Two times a day (BID) | ORAL | Status: DC

## 2015-07-23 MED ORDER — NICOTINE 21 MG/24HR TD PT24: 21.0000 mg | MEDICATED_PATCH | Freq: Every day | TRANSDERMAL | Status: DC

## 2015-07-23 NOTE — Discharge Summary (Signed)
[facility] [place] [facility]   PATIENT NAME: Joel Tanner    MR#:  [id number]  DATE OF BIRTH:  Dec 02, 1970  DATE OF ADMISSION:  07/22/2015 ADMITTING PHYSICIAN: Adrian Saran, MD  DATE OF DISCHARGE: 07/23/2015 10:23 AM  PRIMARY CARE PHYSICIAN: [facility]    ADMISSION DIAGNOSIS:  Hepatic encephalopathy (HCC) [K72.90]  DISCHARGE DIAGNOSIS:  Active Problems:   Hepatic encephalopathy (HCC)   SECONDARY DIAGNOSIS:   Past Medical History  Diagnosis Date  . Hepatitis C   . Elevated transaminase level   . Chronic alcohol abuse   . Seizure (HCC)   . Tobacco use     HOSPITAL COURSE:    45 year old male with a history of EtOH, tobacco abuse and hepatitis C who presents with acute encephalopathy.  1. Acute hepatic encephalopathy: Ammonia levels on admission was > 100. He was treated with lactulose and ammonia level has improved. His mental status is at baseline. 2. History of hepatitis C: Patient will be referred to GI for outpatient follow-up. 3. Tobacco dependent: Patient is encouraged to stop smoking. Patient counseled for 4 minutes on admission.   4. EtOH abuse: Detoxification was uneventful. Patient is encouraged to decrease EtOH use  DISCHARGE CONDITIONS AND DIET:   Patient is stable for discharge on a heart healthy diet  CONSULTS OBTAINED:     DRUG ALLERGIES:  No Known Allergies  DISCHARGE MEDICATIONS:   Discharge Medication List as of 07/23/2015  9:45 AM    START taking these medications   Details  lactulose (CHRONULAC) 10 GM/15ML solution Take 45 mLs (30 g total) by mouth 2 (two) times daily., Starting 07/23/2015, Until Discontinued, Normal    nicotine (NICODERM CQ - DOSED IN MG/24 HOURS) 21 mg/24hr patch Place 1 patch (21 mg total) onto the skin daily., Starting 07/23/2015, Until Discontinued, Normal      CONTINUE these medications which have NOT CHANGED   Details  omeprazole (PRILOSEC) 20 MG capsule Take 20 mg by  mouth daily as needed., Until Discontinued, Historical Med              Today   CHIEF COMPLAINT:  Patient is doing well this morning. Patient mental status is back at baseline   VITAL SIGNS:  Blood pressure 134/74, pulse 74, temperature 97.4 F (36.3 C), temperature source Oral, resp. rate 18, height  (1.753 m), weight 69.99 kg (154 lb 4.8 oz), SpO2 99 %.   REVIEW OF SYSTEMS:  Review of Systems  Constitutional: Negative for fever, chills and malaise/fatigue.  HENT: Negative for sore throat.   Eyes: Negative for blurred vision.  Respiratory: Negative for cough, hemoptysis, shortness of breath and wheezing.   Cardiovascular: Negative for chest pain, palpitations and leg swelling.  Gastrointestinal: Negative for nausea, vomiting, abdominal pain, diarrhea and blood in stool.  Genitourinary: Negative for dysuria.  Musculoskeletal: Negative for back pain.  Neurological: Negative for dizziness, tremors and headaches.  Endo/Heme/Allergies: Does not bruise/bleed easily.     PHYSICAL EXAMINATION:  GENERAL:  45 y.o.-year-old patient lying in the bed with no acute distress.  NECK:  Supple, no jugular venous distention. No thyroid enlargement, no tenderness.  LUNGS: Normal breath sounds bilaterally, no wheezing, rales,rhonchi  No use of accessory muscles of respiration.  CARDIOVASCULAR: S1, S2 normal. No murmurs, rubs, or gallops.  ABDOMEN: Soft, non-tender, non-distended. Bowel sounds present. No organomegaly or mass.  EXTREMITIES: No pedal edema, cyanosis, or clubbing.  PSYCHIATRIC: The patient is alert and oriented x 3.  SKIN:  No obvious rash, lesion, or ulcer.   DATA REVIEW:   CBC  Recent Labs Lab 07/22/15 1121  WBC 5.1  HGB 13.0  HCT 36.9*  PLT 186    Chemistries   Recent Labs Lab 07/23/15 0450  NA NOT CALCULATED  K 3.4*  CL 111  CO2 18*  GLUCOSE 94  BUN 18  CREATININE 0.80  CALCIUM 8.6*  AST 74*  ALT 59  ALKPHOS 69  BILITOT 0.7    Cardiac  Enzymes No results for input(s): TROPONINI in the last 168 hours.  Microbiology Results  @  RADIOLOGY:  Ct Head Wo Contrast  07/22/2015  CLINICAL DATA:  Confusion, altered mental status since this morning when patient to work, last seen normal yesterday, history smoking, hepatitis-C, chronic alcohol abuse EXAM: CT HEAD WITHOUT CONTRAST TECHNIQUE: Contiguous axial images were obtained from the base of the skull through the vertex without intravenous contrast. COMPARISON:  For 06/2011 FINDINGS: Generalized atrophy. Normal ventricular morphology. No midline shift or mass effect. Normal appearance of brain parenchyma. No intracranial hemorrhage, mass lesion, or evidence acute infarction. No extra-axial fluid collections. Mucosal thickening throughout the ethmoid air cells and at the bases of the frontal sinuses. Bones unremarkable. IMPRESSION: No acute intracranial abnormalities. Sinus disease changes as above. Electronically Signed   By: Ulyses Southward M.D.   On: 07/22/2015 12:48      Management plans discussed with the patient and he is in agreement. Stable for discharge home  Patient should follow up with GI 2 weeks  CODE STATUS:     Code Status Orders        Start     Ordered   07/22/15 1501  Full code   Continuous     07/22/15 1500    Code Status History    Date Active Date Inactive Code Status Order ID Comments User Context   This patient has a current code status but no historical code status.      TOTAL TIME TAKING CARE OF THIS PATIENT: 35 minutes.    Note: This dictation was prepared with Dragon dictation along with smaller phrase technology. Any transcriptional errors that result from this process are unintentional.  Nyeema Want M.D on 07/23/2015 at 10:43 AM  Between 7am to 6pm - Pager - [phone] After 6pm go to [email or web address] - password [email or web address] [facility]  [facility]  Office  [phone]  CC: Primary care physician; No primary care provider  on file.

## 2015-07-23 NOTE — Progress Notes (Signed)
Discharge instructions given with verbalized understanding.  VSS.  Patient denies any pain prior to discharge.  Mother present for discharge instructions and has no questions.  Patient taken to visitors entrance in wheelchair to be taken home by mother in personal vehicle.

## 2015-07-23 NOTE — Progress Notes (Signed)
Patient has no acute event overnight. He remained A&O X4 without any sign of alcohol withdrawal. Ammonia level continue to be trending down.

## 2019-02-12 ENCOUNTER — Other Ambulatory Visit: Payer: Self-pay

## 2019-02-12 ENCOUNTER — Ambulatory Visit
Admission: RE | Admit: 2019-02-12 | Discharge: 2019-02-12 | Disposition: A | Discharge status: Home / Self Care | Payer: BC Managed Care – PPO | Source: Ambulatory Visit | Attending: Family Medicine | Admitting: Family Medicine

## 2019-02-12 ENCOUNTER — Encounter (INDEPENDENT_AMBULATORY_CARE_PROVIDER_SITE_OTHER): Payer: Self-pay

## 2019-02-12 ENCOUNTER — Other Ambulatory Visit: Payer: Self-pay | Admitting: Family Medicine

## 2019-02-12 DIAGNOSIS — R1013 Epigastric pain: Secondary | ICD-10-CM | POA: Insufficient documentation

## 2019-03-31 ENCOUNTER — Other Ambulatory Visit: Payer: Self-pay

## 2019-03-31 ENCOUNTER — Encounter: Payer: Self-pay | Admitting: Emergency Medicine

## 2019-03-31 ENCOUNTER — Emergency Department: Payer: BC Managed Care – PPO

## 2019-03-31 ENCOUNTER — Inpatient Hospital Stay
Admission: EM | Admit: 2019-03-31 | Discharge: 2019-04-02 | DRG: 442 | Disposition: A | Discharge status: Home / Self Care | Payer: BC Managed Care – PPO | Attending: Internal Medicine | Admitting: Internal Medicine

## 2019-03-31 DIAGNOSIS — D689 Coagulation defect, unspecified: Secondary | ICD-10-CM | POA: Diagnosis present

## 2019-03-31 DIAGNOSIS — R454 Irritability and anger: Secondary | ICD-10-CM | POA: Diagnosis present

## 2019-03-31 DIAGNOSIS — F17290 Nicotine dependence, other tobacco product, uncomplicated: Secondary | ICD-10-CM | POA: Diagnosis present

## 2019-03-31 DIAGNOSIS — F1210 Cannabis abuse, uncomplicated: Secondary | ICD-10-CM | POA: Diagnosis present

## 2019-03-31 DIAGNOSIS — Z20828 Contact with and (suspected) exposure to other viral communicable diseases: Secondary | ICD-10-CM | POA: Diagnosis present

## 2019-03-31 DIAGNOSIS — E7220 Disorder of urea cycle metabolism, unspecified: Secondary | ICD-10-CM

## 2019-03-31 DIAGNOSIS — K7290 Hepatic failure, unspecified without coma: Secondary | ICD-10-CM

## 2019-03-31 DIAGNOSIS — F1910 Other psychoactive substance abuse, uncomplicated: Secondary | ICD-10-CM | POA: Diagnosis present

## 2019-03-31 DIAGNOSIS — K7200 Acute and subacute hepatic failure without coma: Principal | ICD-10-CM | POA: Diagnosis present

## 2019-03-31 DIAGNOSIS — K7682 Hepatic encephalopathy: Secondary | ICD-10-CM | POA: Diagnosis present

## 2019-03-31 DIAGNOSIS — F1020 Alcohol dependence, uncomplicated: Secondary | ICD-10-CM | POA: Diagnosis present

## 2019-03-31 DIAGNOSIS — B1920 Unspecified viral hepatitis C without hepatic coma: Secondary | ICD-10-CM | POA: Diagnosis present

## 2019-03-31 LAB — CBC
HCT: 38.9 % — ABNORMAL LOW (ref 39.0–52.0)
Hemoglobin: 13.8 g/dL (ref 13.0–17.0)
MCH: 31.4 pg (ref 26.0–34.0)
MCHC: 35.5 g/dL (ref 30.0–36.0)
MCV: 88.4 fL (ref 80.0–100.0)
Platelets: 227 10*3/uL (ref 150–400)
RBC: 4.4 MIL/uL (ref 4.22–5.81)
RDW: 12.4 % (ref 11.5–15.5)
WBC: 10.4 10*3/uL (ref 4.0–10.5)
nRBC: 0 % (ref 0.0–0.2)

## 2019-03-31 LAB — COMPREHENSIVE METABOLIC PANEL
ALT: 50 U/L — ABNORMAL HIGH (ref 0–44)
AST: 53 U/L — ABNORMAL HIGH (ref 15–41)
Albumin: 4.7 g/dL (ref 3.5–5.0)
Alkaline Phosphatase: 83 U/L (ref 38–126)
Anion gap: 13 (ref 5–15)
BUN: 14 mg/dL (ref 6–20)
CO2: 22 mmol/L (ref 22–32)
Calcium: 9.2 mg/dL (ref 8.9–10.3)
Chloride: 101 mmol/L (ref 98–111)
Creatinine, Ser: 0.73 mg/dL (ref 0.61–1.24)
GFR calc Af Amer: >60 mL/min (ref >60)
GFR calc non Af Amer: >60 mL/min (ref >60)
Glucose, Bld: 108 mg/dL — ABNORMAL HIGH (ref 70–99)
Potassium: 3.6 mmol/L (ref 3.5–5.1)
Sodium: 136 mmol/L (ref 135–145)
Total Bilirubin: 1 mg/dL (ref 0.3–1.2)
Total Protein: 8.5 g/dL — ABNORMAL HIGH (ref 6.5–8.1)

## 2019-03-31 LAB — URINALYSIS, ROUTINE W REFLEX MICROSCOPIC
Bilirubin Urine: NEGATIVE
Glucose, UA: NEGATIVE mg/dL
Hgb urine dipstick: NEGATIVE
Ketones, ur: NEGATIVE mg/dL
Leukocytes,Ua: NEGATIVE
Nitrite: NEGATIVE
Protein, ur: NEGATIVE mg/dL
Specific Gravity, Urine: 1.003 — ABNORMAL LOW (ref 1.005–1.030)
pH: 7 (ref 5.0–8.0)

## 2019-03-31 LAB — URINE DRUG SCREEN, QUALITATIVE (ARMC ONLY)
Amphetamines, Ur Screen: NOT DETECTED
Barbiturates, Ur Screen: NOT DETECTED
Benzodiazepine, Ur Scrn: NOT DETECTED
Cannabinoid 50 Ng, Ur ~~LOC~~: POSITIVE — AB
Cocaine Metabolite,Ur ~~LOC~~: NOT DETECTED
MDMA (Ecstasy)Ur Screen: NOT DETECTED
Methadone Scn, Ur: NOT DETECTED
Opiate, Ur Screen: NOT DETECTED
Phencyclidine (PCP) Ur S: NOT DETECTED
Tricyclic, Ur Screen: NOT DETECTED

## 2019-03-31 LAB — PROTIME-INR
INR: 1.3 — ABNORMAL HIGH (ref 0.8–1.2)
Prothrombin Time: 15.6 seconds — ABNORMAL HIGH (ref 11.4–15.2)

## 2019-03-31 LAB — DIFFERENTIAL
Abs Immature Granulocytes: 0.03 10*3/uL (ref 0.00–0.07)
Basophils Absolute: 0.1 10*3/uL (ref 0.0–0.1)
Basophils Relative: 1 %
Eosinophils Absolute: 0.1 10*3/uL (ref 0.0–0.5)
Eosinophils Relative: 1 %
Immature Granulocytes: 0 %
Lymphocytes Relative: 38 %
Lymphs Abs: 3.9 10*3/uL (ref 0.7–4.0)
Monocytes Absolute: 0.6 10*3/uL (ref 0.1–1.0)
Monocytes Relative: 6 %
Neutro Abs: 5.7 10*3/uL (ref 1.7–7.7)
Neutrophils Relative %: 54 %

## 2019-03-31 LAB — AMMONIA: Ammonia: 306 umol/L — ABNORMAL HIGH (ref 9–35)

## 2019-03-31 LAB — GLUCOSE, CAPILLARY: Glucose-Capillary: 102 mg/dL — ABNORMAL HIGH (ref 70–99)

## 2019-03-31 LAB — APTT: aPTT: 48 seconds — ABNORMAL HIGH (ref 24–36)

## 2019-03-31 MED ORDER — LACTULOSE 10 GM/15ML PO SOLN
30.0000 g | Freq: Once | ORAL | Status: AC
  Administered 2019-04-01: 30 g via ORAL
  Filled 2019-03-31: qty 60

## 2019-03-31 MED ORDER — SODIUM CHLORIDE 0.9% FLUSH: 3.0000 mL | Freq: Once | INTRAVENOUS | Status: DC

## 2019-03-31 NOTE — ED Provider Notes (Signed)
[facility] Emergency Department Provider Note   ____________________________________________   First MD Initiated Contact with Patient 03/31/19 2351     (approximate)  I have reviewed the triage vital signs and the nursing notes.   HISTORY  Chief Complaint Altered Mental Status  Level V caveat:   HPI Joel Tanner Sr. is a 48 y.o. male brought to the ED by his wife with a chief complaint of altered mental status/confusion. Wife states patient stopped drinking "cold [place]" 25 days ago.   Then, wife has noted patient with increasing irritability and confusion.  Denies DTs.  Seen by PCP with lab work pending.  Denies fever, cough, chest pain, shortness of breath, abdominal pain, nausea or vomiting.  Denies recent trauma.   Past Medical History:  Diagnosis Date  . Chronic alcohol abuse   . Elevated transaminase level   . Hepatitis C   . Seizure (HCC)   . Tobacco use     Patient Active Problem List   Diagnosis Date Noted  . Hepatic encephalopathy (HCC) 07/22/2015    Past Surgical History:  Procedure Laterality Date  . CARPAL TUNNEL RELEASE      Prior to Admission medications   Medication Sig Start Date End Date Taking? Authorizing Provider  lactulose (CHRONULAC) 10 GM/15ML solution Take 45 mLs (30 g total) by mouth 2 (two) times daily. 07/23/15   Adrian Saran, MD  nicotine (NICODERM CQ - DOSED IN MG/24 HOURS) 21 mg/24hr patch Place 1 patch (21 mg total) onto the skin daily. 07/23/15   Adrian Saran, MD  omeprazole (PRILOSEC) 20 MG capsule Take 20 mg by mouth daily as needed.    [provider]    Allergies Patient has no known allergies.  No family history on file.  Social History Social History   Tobacco Use  . Smoking status: Current Every Day Smoker    Packs/day: 6.00    Types: Cigars  Substance Use Topics  . Alcohol use: Yes    Alcohol/week: 6.0 standard drinks    Types: 6 Cans of beer per week    Comment: 6 beers daily   . Drug use: No    Review of Systems  Constitutional: No fever/chills Eyes: No visual changes. ENT: No sore throat. Cardiovascular: Denies chest pain. Respiratory: Denies shortness of breath. Gastrointestinal: No abdominal pain.  No nausea, no vomiting.  No diarrhea.  No constipation. Genitourinary: Negative for dysuria. Musculoskeletal: Negative for back pain. Skin: Negative for rash. Neurological: Positive for increasing confusion and irritability.  Negative for headaches, focal weakness or numbness.   ____________________________________________   PHYSICAL EXAM:  VITAL SIGNS: ED Triage Vitals  Enc Vitals Group     BP 03/31/19 2023 116/89     Pulse Rate 03/31/19 2023 90     Resp 03/31/19 2023 20     Temp 03/31/19 2023 97.9 F (36.6 C)     Temp src --      SpO2 03/31/19 2023 99 %     Weight 03/31/19 2024 150 lb (68 kg)     Height 03/31/19 2024 5\' 9"  (1.753 m)     Head Circumference --      Peak Flow --      Pain Score 03/31/19 2023 0     Pain Loc --      Pain Edu? --      Excl. in GC? --     Constitutional: Alert and oriented. Well appearing and in mild acute distress. Eyes: Conjunctivae are  normal. PERRL. EOMI. Head: Atraumatic. Nose: No congestion/rhinnorhea. Mouth/Throat: Mucous membranes are moist.  Oropharynx non-erythematous. Neck: No stridor.   Cardiovascular: Normal rate, regular rhythm. Grossly normal heart sounds.  Good peripheral circulation. Respiratory: Normal respiratory effort.  No retractions. Lungs CTAB. Gastrointestinal: Soft and nontender. No distention. No abdominal bruits. No CVA tenderness. Musculoskeletal: No lower extremity tenderness nor edema.  No joint effusions. Neurologic:  Normal speech and language. No gross focal neurologic deficits are appreciated.  Intermittent confusion. Skin:  Skin is warm, dry and intact. No rash noted. Psychiatric: Mood and affect are normal. Speech and behavior are normal.   ____________________________________________   LABS (all labs ordered are listed, but only abnormal results are displayed)  Labs Reviewed  PROTIME-INR - Abnormal; Notable for the following components:      Result Value   Prothrombin Time 15.6 (*)    INR 1.3 (*)    All other components within normal limits  APTT - Abnormal; Notable for the following components:   aPTT 48 (*)    All other components within normal limits  CBC - Abnormal; Notable for the following components:   HCT 38.9 (*)    All other components within normal limits  COMPREHENSIVE METABOLIC PANEL - Abnormal; Notable for the following components:   Glucose, Bld 108 (*)    Total Protein 8.5 (*)    AST 53 (*)    ALT 50 (*)    All other components within normal limits  GLUCOSE, CAPILLARY - Abnormal; Notable for the following components:   Glucose-Capillary 102 (*)    All other components within normal limits  AMMONIA - Abnormal; Notable for the following components:   Ammonia 306 (*)    All other components within normal limits  URINE DRUG SCREEN, QUALITATIVE ([facility] ONLY) - Abnormal; Notable for the following components:   Cannabinoid 50 Ng, Ur Garza POSITIVE (*)    All other components within normal limits  URINALYSIS, ROUTINE W REFLEX MICROSCOPIC - Abnormal; Notable for the following components:   Color, Urine COLORLESS (*)    APPearance CLEAR (*)    Specific Gravity, Urine 1.003 (*)    All other components within normal limits  DIFFERENTIAL  LIPASE, BLOOD  PROTIME-INR  CBG MONITORING, ED   ____________________________________________  EKG  ED ECG REPORT I, Gonzalo Waymire J, the attending physician, personally viewed and interpreted this ECG.   Date: 04/01/2019  EKG Time: 0123  Rate: 77  Rhythm: normal EKG, normal sinus rhythm  Axis: Normal  Intervals:none  ST&T Change: Nonspecific  ____________________________________________  RADIOLOGY  ED MD interpretation: No ICH  Official radiology report(s):  Ct Head Wo Contrast  Result Date: 03/31/2019 CLINICAL DATA:  Altered mental status EXAM: CT HEAD WITHOUT CONTRAST TECHNIQUE: Contiguous axial images were obtained from the base of the skull through the vertex without intravenous contrast. COMPARISON:  CT dated 07/22/2015. FINDINGS: Brain: No evidence of acute infarction, hemorrhage, hydrocephalus, extra-axial collection or mass lesion/mass effect. Vascular: No hyperdense vessel or unexpected calcification. Skull: Normal. Negative for fracture or focal lesion. Sinuses/Orbits: No acute finding. Other: None. IMPRESSION: No acute intracranial abnormality. Electronically Signed   By: Katherine Mantlehristopher  Green M.D.   On: 03/31/2019 21:33    ____________________________________________   PROCEDURES  Procedure(s) performed (including Critical Care):  Procedures   ____________________________________________   INITIAL IMPRESSION / ASSESSMENT AND PLAN / ED COURSE  As part of my medical decision making, I reviewed the following data within the electronic MEDICAL RECORD NUMBER History obtained from family, Nursing notes reviewed and  incorporated, Labs reviewed, EKG interpreted, Old chart reviewed, Radiograph reviewed, Discussed with admitting physician Dr. Posey Pronto and Notes from prior ED visits     Joel Lunger Sr. was evaluated in Emergency Department on 04/01/2019 for the symptoms described in the history of present illness. He was evaluated in the context of the global COVID-19 pandemic, which necessitated consideration that the patient might be at risk for infection with the SARS-CoV-2 virus that causes COVID-19. Institutional protocols and algorithms that pertain to the evaluation of patients at risk for COVID-19 are in a state of rapid change based on information released by regulatory bodies including the CDC and federal and state organizations. These policies and algorithms were followed during the patient's care in the ED.    48 year old male, recently  former alcoholic, brought for increasing confusion. Differential diagnosis includes, but is not limited to, alcohol, illicit or prescription medications, or other toxic ingestion; intracranial pathology such as stroke or intracerebral hemorrhage; fever or infectious causes including sepsis; hypoxemia and/or hypercarbia; uremia; trauma; endocrine related disorders such as diabetes, hypoglycemia, and thyroid-related diseases; hypertensive encephalopathy; etc.  Laboratory results remarkable for elevated ammonia.  Will start lactulose.  Discussed with hospitalist Dr. Posey Pronto who will evaluate patient in emergency department for admission.      ____________________________________________   FINAL CLINICAL IMPRESSION(S) / ED DIAGNOSES  Final diagnoses:  Hyperammonemia ([place])  Hepatic encephalopathy ([place])  Cannabis abuse     ED Discharge Orders    None       Note:  This document was prepared using Dragon voice recognition software and may include unintentional dictation errors.   Paulette Blanch, MD 04/01/19 [phone]

## 2019-03-31 NOTE — ED Notes (Signed)
FIRST NURSE NOTE:  Pt here with wife, states he has been having altered mental status for the past couple of hours.

## 2019-03-31 NOTE — ED Triage Notes (Signed)
Patient noted gait unsteady, speech slurred, grips weak but equal. Smile symmetrical. Wife noted this at 1830. Patient had been out playing golf with friends and was noticed after. States she last saw him normal at 0815 this am. Patient cannot state when these symptoms started.

## 2019-04-01 ENCOUNTER — Other Ambulatory Visit: Payer: Self-pay

## 2019-04-01 DIAGNOSIS — R454 Irritability and anger: Secondary | ICD-10-CM | POA: Diagnosis present

## 2019-04-01 DIAGNOSIS — D689 Coagulation defect, unspecified: Secondary | ICD-10-CM | POA: Diagnosis present

## 2019-04-01 DIAGNOSIS — F1020 Alcohol dependence, uncomplicated: Secondary | ICD-10-CM | POA: Diagnosis present

## 2019-04-01 DIAGNOSIS — K7290 Hepatic failure, unspecified without coma: Secondary | ICD-10-CM

## 2019-04-01 DIAGNOSIS — Z20828 Contact with and (suspected) exposure to other viral communicable diseases: Secondary | ICD-10-CM | POA: Diagnosis present

## 2019-04-01 DIAGNOSIS — F1910 Other psychoactive substance abuse, uncomplicated: Secondary | ICD-10-CM | POA: Diagnosis present

## 2019-04-01 DIAGNOSIS — B1920 Unspecified viral hepatitis C without hepatic coma: Secondary | ICD-10-CM | POA: Diagnosis present

## 2019-04-01 DIAGNOSIS — F1210 Cannabis abuse, uncomplicated: Secondary | ICD-10-CM | POA: Diagnosis present

## 2019-04-01 DIAGNOSIS — K7200 Acute and subacute hepatic failure without coma: Secondary | ICD-10-CM | POA: Diagnosis present

## 2019-04-01 DIAGNOSIS — F17290 Nicotine dependence, other tobacco product, uncomplicated: Secondary | ICD-10-CM | POA: Diagnosis present

## 2019-04-01 LAB — LIPID PANEL
Cholesterol: 181 mg/dL (ref 0–200)
HDL: 33 mg/dL — ABNORMAL LOW (ref >40)
LDL Cholesterol: UNDETERMINED mg/dL (ref 0–99)
Total CHOL/HDL Ratio: 5.5 RATIO
Triglycerides: 843 mg/dL — ABNORMAL HIGH (ref <150)
VLDL: UNDETERMINED mg/dL (ref 0–40)

## 2019-04-01 LAB — PROTIME-INR
INR: 1.3 — ABNORMAL HIGH (ref 0.8–1.2)
Prothrombin Time: 15.9 seconds — ABNORMAL HIGH (ref 11.4–15.2)

## 2019-04-01 LAB — CBC
HCT: 35.2 % — ABNORMAL LOW (ref 39.0–52.0)
Hemoglobin: 12.2 g/dL — ABNORMAL LOW (ref 13.0–17.0)
MCH: 31 pg (ref 26.0–34.0)
MCHC: 34.7 g/dL (ref 30.0–36.0)
MCV: 89.6 fL (ref 80.0–100.0)
Platelets: 206 10*3/uL (ref 150–400)
RBC: 3.93 MIL/uL — ABNORMAL LOW (ref 4.22–5.81)
RDW: 12.8 % (ref 11.5–15.5)
WBC: 5.4 10*3/uL (ref 4.0–10.5)
nRBC: 0 % (ref 0.0–0.2)

## 2019-04-01 LAB — MAGNESIUM: Magnesium: 2.2 mg/dL (ref 1.7–2.4)

## 2019-04-01 LAB — COMPREHENSIVE METABOLIC PANEL
ALT: 42 U/L (ref 0–44)
AST: 42 U/L — ABNORMAL HIGH (ref 15–41)
Albumin: 3.7 g/dL (ref 3.5–5.0)
Alkaline Phosphatase: 64 U/L (ref 38–126)
Anion gap: 10 (ref 5–15)
BUN: 13 mg/dL (ref 6–20)
CO2: 19 mmol/L — ABNORMAL LOW (ref 22–32)
Calcium: 8 mg/dL — ABNORMAL LOW (ref 8.9–10.3)
Chloride: 108 mmol/L (ref 98–111)
Creatinine, Ser: 0.69 mg/dL (ref 0.61–1.24)
GFR calc Af Amer: >60 mL/min (ref >60)
GFR calc non Af Amer: >60 mL/min (ref >60)
Glucose, Bld: 90 mg/dL (ref 70–99)
Potassium: 3.6 mmol/L (ref 3.5–5.1)
Sodium: 137 mmol/L (ref 135–145)
Total Bilirubin: 0.8 mg/dL (ref 0.3–1.2)
Total Protein: 6.9 g/dL (ref 6.5–8.1)

## 2019-04-01 LAB — HIV ANTIBODY (ROUTINE TESTING W REFLEX): HIV Screen 4th Generation wRfx: NONREACTIVE

## 2019-04-01 LAB — PHOSPHORUS: Phosphorus: 3.7 mg/dL (ref 2.5–4.6)

## 2019-04-01 LAB — LDL CHOLESTEROL, DIRECT: Direct LDL: 33.8 mg/dL (ref 0–99)

## 2019-04-01 LAB — LIPASE, BLOOD: Lipase: 56 U/L — ABNORMAL HIGH (ref 11–51)

## 2019-04-01 LAB — VITAMIN B12: Vitamin B-12: 272 pg/mL (ref 180–914)

## 2019-04-01 LAB — ETHANOL: Alcohol, Ethyl (B): 194 mg/dL — ABNORMAL HIGH (ref <10)

## 2019-04-01 LAB — AMMONIA: Ammonia: 52 umol/L — ABNORMAL HIGH (ref 9–35)

## 2019-04-01 LAB — TSH: TSH: 2.845 u[IU]/mL (ref 0.350–4.500)

## 2019-04-01 LAB — SARS CORONAVIRUS 2 (TAT 6-24 HRS): SARS Coronavirus 2: NEGATIVE

## 2019-04-01 MED ORDER — THIAMINE HCL 100 MG/ML IJ SOLN
Freq: Once | INTRAVENOUS | Status: AC
  Administered 2019-04-01: 01:00:00 via INTRAVENOUS
  Filled 2019-04-01: qty 1000

## 2019-04-01 MED ORDER — ONDANSETRON HCL 4 MG PO TABS: 4.0000 mg | ORAL_TABLET | Freq: Four times a day (QID) | ORAL | Status: DC | PRN

## 2019-04-01 MED ORDER — THIAMINE HCL 100 MG/ML IJ SOLN
100.0000 mg | Freq: Every day | INTRAMUSCULAR | Status: DC
  Filled 2019-04-01: qty 2

## 2019-04-01 MED ORDER — FOLIC ACID 1 MG PO TABS
1.0000 mg | ORAL_TABLET | Freq: Every day | ORAL | Status: DC
  Administered 2019-04-01 – 2019-04-02 (×2): 1 mg via ORAL
  Filled 2019-04-01 (×2): qty 1

## 2019-04-01 MED ORDER — ADULT MULTIVITAMIN W/MINERALS CH
1.0000 | ORAL_TABLET | Freq: Every day | ORAL | Status: DC
  Administered 2019-04-01 – 2019-04-02 (×2): 1 via ORAL
  Filled 2019-04-01 (×2): qty 1

## 2019-04-01 MED ORDER — ACETAMINOPHEN 650 MG RE SUPP: 650.0000 mg | Freq: Four times a day (QID) | RECTAL | Status: DC | PRN

## 2019-04-01 MED ORDER — ATORVASTATIN CALCIUM 20 MG PO TABS
40.0000 mg | ORAL_TABLET | Freq: Every day | ORAL | Status: DC
  Administered 2019-04-01: 40 mg via ORAL
  Filled 2019-04-01: qty 2

## 2019-04-01 MED ORDER — SODIUM CHLORIDE 0.9% FLUSH
3.0000 mL | Freq: Two times a day (BID) | INTRAVENOUS | Status: DC
  Administered 2019-04-01 – 2019-04-02 (×2): 3 mL via INTRAVENOUS

## 2019-04-01 MED ORDER — VITAMIN B-1 100 MG PO TABS: 100.0000 mg | ORAL_TABLET | Freq: Every day | ORAL | Status: DC

## 2019-04-01 MED ORDER — LACTULOSE 10 GM/15ML PO SOLN
20.0000 g | Freq: Three times a day (TID) | ORAL | Status: DC
  Administered 2019-04-01 (×2): 20 g via ORAL
  Filled 2019-04-01 (×2): qty 30

## 2019-04-01 MED ORDER — LORAZEPAM 2 MG/ML IJ SOLN: 1.0000 mg | INTRAMUSCULAR | Status: DC | PRN

## 2019-04-01 MED ORDER — LORAZEPAM 2 MG PO TABS
0.0000 mg | ORAL_TABLET | Freq: Four times a day (QID) | ORAL | Status: DC
  Administered 2019-04-01: 2 mg via ORAL
  Filled 2019-04-01: qty 1

## 2019-04-01 MED ORDER — LORAZEPAM 2 MG/ML IJ SOLN: 0.0000 mg | Freq: Four times a day (QID) | INTRAMUSCULAR | Status: DC

## 2019-04-01 MED ORDER — THIAMINE HCL 100 MG/ML IJ SOLN
500.0000 mg | Freq: Once | INTRAVENOUS | Status: AC
  Administered 2019-04-01: 05:00:00 500 mg via INTRAVENOUS
  Filled 2019-04-01: qty 5

## 2019-04-01 MED ORDER — ONDANSETRON HCL 4 MG/2ML IJ SOLN: 4.0000 mg | Freq: Four times a day (QID) | INTRAMUSCULAR | Status: DC | PRN

## 2019-04-01 MED ORDER — ENOXAPARIN SODIUM 40 MG/0.4ML ~~LOC~~ SOLN
40.0000 mg | SUBCUTANEOUS | Status: DC
  Administered 2019-04-01 – 2019-04-02 (×2): 40 mg via SUBCUTANEOUS
  Filled 2019-04-01 (×3): qty 0.4

## 2019-04-01 MED ORDER — LORAZEPAM 1 MG PO TABS: 1.0000 mg | ORAL_TABLET | ORAL | Status: DC | PRN

## 2019-04-01 MED ORDER — LORAZEPAM 2 MG/ML IJ SOLN: 0.0000 mg | Freq: Two times a day (BID) | INTRAMUSCULAR | Status: DC

## 2019-04-01 MED ORDER — ACETAMINOPHEN 325 MG PO TABS: 650.0000 mg | ORAL_TABLET | Freq: Four times a day (QID) | ORAL | Status: DC | PRN

## 2019-04-01 MED ORDER — VITAMIN B-1 100 MG PO TABS
100.0000 mg | ORAL_TABLET | Freq: Every day | ORAL | Status: DC
  Administered 2019-04-01 – 2019-04-02 (×2): 100 mg via ORAL
  Filled 2019-04-01 (×2): qty 1

## 2019-04-01 NOTE — H&P (Signed)
[facility] History and Physical   Patient: Joel RangeJohn P Couse Tanner. MVH:[id number]   PCP: Dortha KernBliss, Laura K, MD DOB: 08/12/1970   DOA: 03/31/2019   DOS: 04/01/2019   DOS: the patient was seen and examined on 04/01/2019  Patient coming from: The patient is coming from Home  Chief Complaint: Confusion  HPI: Joel BlancoJohn P Vokes Tanner. is a 48 y.o. male with Past medical history of alcohol use, hepatitis C, active smoker. Patient presented with confusion.  Wife brought the patient to the hospital because she reports that there has been complete change in patient's personality over last few days.  No fall no trauma no injury reported by the wife mentions that the patient has not been himself. That is increasing irritability as well as confusion as if the patient is actually drunk. No nausea no vomiting no diarrhea.  No fever no chills.  No chest pain abdominal pain overlie the patient. Patient denies any alcohol or drinking.  Patient's last drink was 25 days ago. Wife reports no other substance abuse history. When the patient was drinking alcohol he was drinking 6 beers on a daily basis.  ED Course: Patient presents with confusion.  CT head was unremarkable.  Ammonia level was checked which was 306.  Patient was referred for admission for possible hepatic encephalopathy.  At his baseline ambulates without assistance independent for most of his ADL;  manages his medication on his own.  Review of Systems: as mentioned in the history of present illness.  All other systems reviewed and are negative.  Past Medical History:  Diagnosis Date  . Chronic alcohol abuse   . Elevated transaminase level   . Hepatitis C   . Seizure (HCC)   . Tobacco use    Past Surgical History:  Procedure Laterality Date  . CARPAL TUNNEL RELEASE     Social History:  reports that he has been smoking cigars. He has been smoking about 6.00 packs per day. He does not have any smokeless tobacco history on file. He reports  current alcohol use of about 6.0 standard drinks of alcohol per week. He reports that he does not use drugs.  No Known Allergies  Family history reviewed and not pertinent   Prior to Admission medications   Medication Sig Start Date End Date Taking? Authorizing Provider  lactulose (CHRONULAC) 10 GM/15ML solution Take 45 mLs (30 g total) by mouth 2 (two) times daily. 07/23/15   Adrian SaranMody, Sital, MD  nicotine (NICODERM CQ - DOSED IN MG/24 HOURS) 21 mg/24hr patch Place 1 patch (21 mg total) onto the skin daily. 07/23/15   Adrian SaranMody, Sital, MD  omeprazole (PRILOSEC) 20 MG capsule Take 20 mg by mouth daily as needed.    [provider]    Physical Exam: Vitals:   04/01/19 0027 04/01/19 0130 04/01/19 0230 04/01/19 0245  BP: 134/83 115/73 115/73 101/73  Pulse: 85 66 66 78  Resp: 17 (!) 21  (!) 26  Temp:      SpO2: 98% 98%  98%  Weight:      Height:        General: alert and oriented to time, place, and person. Appear in mild distress, affect emotionally labile Eyes: PERRL, Conjunctiva normal ENT: Oral Mucosa Clear, moist  Neck: no JVD, no Abnormal Mass Or lumps Cardiovascular: S1 and S2 Present, no Murmur, peripheral pulses symmetrical Respiratory: good respiratory effort, Bilateral Air entry equal and Decreased, no signs of accessory muscle use, Clear to Auscultation, no Crackles, no wheezes Abdomen:  Bowel Sound present, Soft and no tenderness, no hernia Skin: no rashes  Extremities: no Pedal edema, no calf tenderness Neurologic: without any new focal findings  No asterixis present. Gait not checked due to patient safety concerns  Data Reviewed: I have personally reviewed and interpreted labs, imaging as discussed below.  CBC: Recent Labs  Lab 03/31/19 2031  WBC 10.4  NEUTROABS 5.7  HGB 13.8  HCT 38.9*  MCV 88.4  PLT 160   Basic Metabolic Panel: Recent Labs  Lab 03/31/19 2031  NA 136  K 3.6  CL 101  CO2 22  GLUCOSE 108*  BUN 14  CREATININE 0.73  CALCIUM 9.2    GFR: Estimated Creatinine Clearance: 106.3 mL/min (by C-G formula based on SCr of 0.73 mg/dL). Liver Function Tests: Recent Labs  Lab 03/31/19 2031  AST 53*  ALT 50*  ALKPHOS 83  BILITOT 1.0  PROT 8.5*  ALBUMIN 4.7   Recent Labs  Lab 03/31/19 2031  LIPASE 56*   Recent Labs  Lab 03/31/19 2151  AMMONIA 306*   Coagulation Profile: Recent Labs  Lab 03/31/19 2031 03/31/19 2354  INR 1.3* 1.3*   Cardiac Enzymes: No results for input(s): CKTOTAL, CKMB, CKMBINDEX, TROPONINI in the last 168 hours. BNP (last 3 results) No results for input(s): PROBNP in the last 8760 hours. HbA1C: No results for input(s): HGBA1C in the last 72 hours. CBG: Recent Labs  Lab 03/31/19 2035  GLUCAP 102*   Lipid Profile: No results for input(s): CHOL, HDL, LDLCALC, TRIG, CHOLHDL, LDLDIRECT in the last 72 hours. Thyroid Function Tests: No results for input(s): TSH, T4TOTAL, FREET4, T3FREE, THYROIDAB in the last 72 hours. Anemia Panel: No results for input(s): VITAMINB12, FOLATE, FERRITIN, TIBC, IRON, RETICCTPCT in the last 72 hours. Urine analysis:    Component Value Date/Time   COLORURINE COLORLESS (A) 03/31/2019 2151   APPEARANCEUR CLEAR (A) 03/31/2019 2151   APPEARANCEUR Clear 10/13/2012 0351   LABSPEC 1.003 (L) 03/31/2019 2151   LABSPEC 1.003 10/13/2012 0351   PHURINE 7.0 03/31/2019 2151   GLUCOSEU NEGATIVE 03/31/2019 2151   GLUCOSEU Negative 10/13/2012 0351   HGBUR NEGATIVE 03/31/2019 2151   BILIRUBINUR NEGATIVE 03/31/2019 2151   BILIRUBINUR Negative 10/13/2012 0351   KETONESUR NEGATIVE 03/31/2019 2151   PROTEINUR NEGATIVE 03/31/2019 2151   NITRITE NEGATIVE 03/31/2019 2151   LEUKOCYTESUR NEGATIVE 03/31/2019 2151   LEUKOCYTESUR Negative 10/13/2012 0351    Radiological Exams on Admission: Ct Head Wo Contrast  Result Date: 03/31/2019 CLINICAL DATA:  Altered mental status EXAM: CT HEAD WITHOUT CONTRAST TECHNIQUE: Contiguous axial images were obtained from the base of the skull  through the vertex without intravenous contrast. COMPARISON:  CT dated 07/22/2015. FINDINGS: Brain: No evidence of acute infarction, hemorrhage, hydrocephalus, extra-axial collection or mass lesion/mass effect. Vascular: No hyperdense vessel or unexpected calcification. Skull: Normal. Negative for fracture or focal lesion. Sinuses/Orbits: No acute finding. Other: None. IMPRESSION: No acute intracranial abnormality. Electronically Signed   By: Constance Holster M.D.   On: 03/31/2019 21:33   I reviewed all nursing notes, pharmacy notes, vitals, pertinent old records.  Assessment/Plan 1. Hepatic encephalopathy ([place]) Presents with confusion although the time of my evaluation the patient is alert awake and oriented x3. Does not have any focal deficit. Does not have any asterixis. CT head unremarkable But his ammonia level is significantly elevated at 306. We will continue with lactulose. Check vitamin B1 level as well as provide 1 dose of IV vitamin B1 500 mg.  2.  Substance abuse Patient has  history of alcohol abuse in the past. Also has tobacco abuse history. UDS is positive for marijuana although the patient denies using any marijuana Or any other substance. Continue CIWA protocol for now protection monitor. Social worker consulted for further assistance.  3.  History of hep C. Recent pancreatitis. Recent ultrasound in September shows evidence of fatty steatosis no evidence of cirrhosis. Monitor.  4.  Mild coagulopathy. In the setting of poor p.o. intake after his recent pancreatitis as well as alcohol abuse. Continue to monitor for now. No evidence of active bleeding.   Nutrition: Regular diet DVT Prophylaxis: Subcutaneous Lovenox  Advance goals of care discussion: Full code   Consults: none  Family Communication: family was present at bedside, at the time of interview.  Opportunity was given to ask question and all questions were answered satisfactorily.  Disposition:  Admitted as inpatient, telemetry unit. Likely to be discharged home, in 2 days.  I have discussed plan of care as described above with RN and patient/family.  Author: Lynden Oxford, MD [facility] 04/01/2019 3:02 AM   To reach On-call, see care teams to locate the attending and reach out to them via [email or web address]. If 7PM-7AM, please contact night-coverage If you still have difficulty reaching the attending provider, please page the [facility] (Director on Call) for [facility] on amion for assistance.

## 2019-04-01 NOTE — Progress Notes (Signed)
Admitted by Dr Posey Pronto this morning.   Seen and examined at bedside.  Patient is remember how he got to the hospital.  Had bowel movements after lactulose.  Confusion appears to be getting better.  Denies any complaints and tells me he has not had a drink in some time but his alcohol level was elevated.  CT of the head was unremarkable.  Initial ammonia level was 300 which trended down to 52.  His vital signs are stable.  He is alert awake oriented X3 but slightly confused/clouded judgment.  Does not yet feel back to his baseline.  Normal sinus rhythm.  Abdomen is nontender nondistended.   CT of the head done this admission is negative  Right upper quadrant ultrasound on 02/12/2019 shows diffuse hepatic steatosis.  Altered mental status secondary to acute hepatic encephalopathy and alcohol intoxication.  Continue alcohol withdrawal protocol.  Lactulose.  Trend ammonia levels, continue neurochecks.  CT of the head negative.  Counseled to quit drinking alcohol.  Hepatic steatosis-lipid panel showed significantly elevated triglycerides and LDL.  Start atorvastatin.  LFTs are relatively normal.  Will advise him to follow-up outpatient with primary care physician for long-term management of this.  Maintain him in the hospital until his mentation status is returned to baseline.  Discussed with RN.  Time spent 20 minutes

## 2019-04-01 NOTE — ED Notes (Signed)
Attempted to call report x 1  

## 2019-04-02 LAB — CBC
HCT: 35.2 % — ABNORMAL LOW (ref 39.0–52.0)
Hemoglobin: 12.1 g/dL — ABNORMAL LOW (ref 13.0–17.0)
MCH: 31.2 pg (ref 26.0–34.0)
MCHC: 34.4 g/dL (ref 30.0–36.0)
MCV: 90.7 fL (ref 80.0–100.0)
Platelets: 180 10*3/uL (ref 150–400)
RBC: 3.88 MIL/uL — ABNORMAL LOW (ref 4.22–5.81)
RDW: 12.5 % (ref 11.5–15.5)
WBC: 6.8 10*3/uL (ref 4.0–10.5)
nRBC: 0 % (ref 0.0–0.2)

## 2019-04-02 LAB — COMPREHENSIVE METABOLIC PANEL
ALT: 56 U/L — ABNORMAL HIGH (ref 0–44)
AST: 95 U/L — ABNORMAL HIGH (ref 15–41)
Albumin: 3.5 g/dL (ref 3.5–5.0)
Alkaline Phosphatase: 76 U/L (ref 38–126)
Anion gap: 7 (ref 5–15)
BUN: 20 mg/dL (ref 6–20)
CO2: 19 mmol/L — ABNORMAL LOW (ref 22–32)
Calcium: 8.5 mg/dL — ABNORMAL LOW (ref 8.9–10.3)
Chloride: 111 mmol/L (ref 98–111)
Creatinine, Ser: 0.87 mg/dL (ref 0.61–1.24)
GFR calc Af Amer: >60 mL/min (ref >60)
GFR calc non Af Amer: >60 mL/min (ref >60)
Glucose, Bld: 100 mg/dL — ABNORMAL HIGH (ref 70–99)
Potassium: 3.6 mmol/L (ref 3.5–5.1)
Sodium: 137 mmol/L (ref 135–145)
Total Bilirubin: 1.3 mg/dL — ABNORMAL HIGH (ref 0.3–1.2)
Total Protein: 6.7 g/dL (ref 6.5–8.1)

## 2019-04-02 LAB — FOLATE RBC
Folate, Hemolysate: 306 ng/mL
Folate, RBC: 836 ng/mL (ref >498)
Hematocrit: 36.6 % — ABNORMAL LOW (ref 37.5–51.0)

## 2019-04-02 LAB — MAGNESIUM: Magnesium: 1.9 mg/dL (ref 1.7–2.4)

## 2019-04-02 MED ORDER — FOLIC ACID 1 MG PO TABS: 1.0000 mg | ORAL_TABLET | Freq: Every day | ORAL | 0 refills | Status: DC

## 2019-04-02 MED ORDER — ATORVASTATIN CALCIUM 40 MG PO TABS: 40.0000 mg | ORAL_TABLET | Freq: Every day | ORAL | 0 refills | Status: DC

## 2019-04-02 MED ORDER — THIAMINE HCL 100 MG PO TABS: 100.0000 mg | ORAL_TABLET | Freq: Every day | ORAL | 0 refills | Status: AC

## 2019-04-02 MED ORDER — LACTULOSE 10 GM/15ML PO SOLN: 10.0000 g | Freq: Two times a day (BID) | ORAL | 0 refills | Status: DC | PRN

## 2019-04-02 NOTE — Evaluation (Signed)
Physical Therapy Evaluation Patient Details Name: Joel REINHARDT Sr. MRN: [id number] DOB: 02-16-1971 Today's Date: 04/02/2019   History of Present Illness  Pt is a 48 y.o. male presenting to hospital 10/31 with AMS/confusion.  Pt admitted with hepatic encephalopathy, h/o substance abuse, and mild coagulopathy.  PMH includes h/o alcohol use, Hep C, seizure, hepatic encephalopathy, and CTR.  Clinical Impression  Prior to hospital admission, pt was independent.  Pt lives with his wife in 1 level home with 3 steps to enter with B railings.  Currently pt is independent with bed mobility, transfers, and ambulation around nursing loop (no AD); modified independent navigating 6 steps with railing.  No loss of balance noted during session's activities.  HR and O2 on room air WFL during session's activities.  Pt appears to be at baseline level of functional mobility; no acute PT needs identified; will sign off.    Follow Up Recommendations No PT follow up    Equipment Recommendations  None recommended by PT    Recommendations for Other Services       Precautions / Restrictions Precautions Precautions: None Restrictions Weight Bearing Restrictions: No      Mobility  Bed Mobility Overal bed mobility: Independent             General bed mobility comments: Semi-supine to sit without any noted difficulties  Transfers Overall transfer level: Independent Equipment used: None             General transfer comment: steady strong transfers noted  Ambulation/Gait Ambulation/Gait assistance: Independent Gait Distance (Feet): 240 Feet Assistive device: None Gait Pattern/deviations: WFL(Within Functional Limits)     General Gait Details: steady ambulation  Stairs Stairs: Yes Stairs assistance: Modified independent (Device/Increase time) Stair Management: One rail Right;Alternating pattern;Forwards Number of Stairs: 6 General stair comments: steady safe stairs negotiation  noted  Wheelchair Mobility    Modified Rankin (Stroke Patients Only)       Balance Overall balance assessment: Independent(No loss of balance with ambulation with head turns R/L.)                                           Pertinent Vitals/Pain Pain Assessment: No/denies pain    Home Living Family/patient expects to be discharged to:: Private residence Living Arrangements: Spouse/significant other Available Help at Discharge: Family Type of Home: House Home Access: Stairs to enter Entrance Stairs-Rails: Right;Left;Can reach both Technical brewer of Steps: 3 Home Layout: One level Home Equipment: Grab bars - tub/shower      Prior Function Level of Independence: Independent         Comments: Pt works as a Development worker, community.  Pt reports no recent h/o falls.     Hand Dominance        Extremity/Trunk Assessment   Upper Extremity Assessment Upper Extremity Assessment: Overall WFL for tasks assessed    Lower Extremity Assessment Lower Extremity Assessment: Overall WFL for tasks assessed    Cervical / Trunk Assessment Cervical / Trunk Assessment: Normal  Communication   Communication: No difficulties  Cognition Arousal/Alertness: Awake/alert Behavior During Therapy: WFL for tasks assessed/performed Overall Cognitive Status: Within Functional Limits for tasks assessed                                 General Comments: Pt oriented to person, place, and time;  overall oriented to current situation but pt reports difficulty remembering events around hospital admission.      General Comments   Nursing cleared pt for participation in physical therapy.  Pt agreeable to PT session.    Exercises     Assessment/Plan    PT Assessment Patent does not need any further PT services  PT Problem List         PT Treatment Interventions      PT Goals (Current goals can be found in the Care Plan section)  Acute Rehab PT Goals Patient  Stated Goal: to go home and get back to working PT Goal Formulation: With patient Time For Goal Achievement: 04/16/19 Potential to Achieve Goals: Good    Frequency     Barriers to discharge        Co-evaluation               AM-PAC PT "6 Clicks" Mobility  Outcome Measure Help needed turning from your back to your side while in a flat bed without using bedrails?: None Help needed moving from lying on your back to sitting on the side of a flat bed without using bedrails?: None Help needed moving to and from a bed to a chair (including a wheelchair)?: None Help needed standing up from a chair using your arms (e.g., wheelchair or bedside chair)?: None Help needed to walk in hospital room?: None Help needed climbing 3-5 steps with a railing? : None 6 Click Score: 24    End of Session Equipment Utilized During Treatment: Gait belt Activity Tolerance: Patient tolerated treatment well Patient left: in chair;with call bell/phone within reach Nurse Communication: Mobility status;Precautions PT Visit Diagnosis: Muscle weakness (generalized) (M62.81)    Time: 1324-4010 PT Time Calculation (min) (ACUTE ONLY): 15 min   Charges:   PT Evaluation $PT Eval Low Complexity: 1 Low          Glade Strausser, PT 04/02/19, 10:29 AM [phone]

## 2019-04-02 NOTE — TOC Transition Note (Signed)
Transition of Care [facility]) - CM/SW Discharge Note   Patient Details  Name: Joel DOBBINS Sr. MRN: [id number] Date of Birth: [birth year]  Transition of Care [facility] CM/SW Contact:  Candie Chroman, LCSW Phone Number: 04/02/2019, 12:04 PM   Clinical Narrative:  CSW met with patient. Wife at bedside. CSW introduced role and inquired about interest in [facility] resources list, which includes counseling and substance abuse treatment. Wife took list and said they will likely follow up with their PCP. No further concerns. Patient has orders to discharge home today. CSW signing off.   Final next level of care: Home/Self Care Barriers to Discharge: Barriers Resolved   Patient Goals and CMS Choice     Choice offered to / list presented to : NA  Discharge Placement                       Discharge Plan and Services     Post Acute Care Choice: NA                               Social Determinants of Health (SDOH) Interventions     Readmission Risk Interventions No flowsheet data found.

## 2019-04-02 NOTE — Progress Notes (Signed)
Iv and telemetry removed due to pt being discharged home. Medications and discharged instructions explained to pt and wife who is at bedside. Pt transported via w/c  Off the unit to be driven home by wife. No distress noted.

## 2019-04-02 NOTE — Plan of Care (Signed)
  Problem: Clinical Measurements: Goal: Will remain free from infection Outcome: Progressing Goal: Diagnostic test results will improve Outcome: Progressing   Problem: Coping: Goal: Level of anxiety will decrease Outcome: Adequate for Discharge   Problem: Pain Managment: Goal: General experience of comfort will improve Outcome: Adequate for Discharge

## 2019-04-02 NOTE — Discharge Summary (Signed)
Physician Discharge Summary  Joel BlancoJohn P Boulais Tanner. ZOX:[id number] DOB: 11/05/1970 DOA: 03/31/2019  PCP: Dortha KernBliss, Laura K, MD  Admit date: 03/31/2019 Discharge date: 04/02/2019  Admitted From: Home Disposition: Home  Recommendations for Outpatient Follow-up:  1. Follow up with PCP in 1-2 weeks 2. Please obtain BMP/CBC in one week your next doctors visit.  3. Atorvastatin 40 mg daily prescribed.  Repeat LFTs in about 2-4 weeks 4. Advised to quit drinking.  Folic acid and thiamine prescribed.  Over-the-counter multivitamins can be taken. 5. Lactulose prescribed for encephalopathy.   Discharge Condition: Stable CODE STATUS: Full code Diet recommendation: 2 g salt  Brief/Interim Summary: 48 y.o. male with Past medical history of alcohol use,  active smoker. Patient presented with confusion.  Upon admission was noted to have ammonia levels greater than 300.  CT of the head was negative.  Rest of the work-up was unremarkable.  Alcohol level was elevated.  He was admitted for hepatic encephalopathy.  Right upper quadrant ultrasound from last month was reviewed which showed hepatic steatosis.  Lipid levels were elevated therefore started on atorvastatin.  His mentation cleared up over the course of hospitalization and returned back to normal.  On the day of discharge I had extensive discussion with and recommended that he needs to quit drinking alcohol, take atorvastatin daily as prescribed and follow-up outpatient with PCP in about 2-4 weeks and get repeat lab work done.  Also spoke with his wife regarding his hospital course.  Stable for discharge today.  Discharge Diagnoses:  Principal Problem:   Hepatic encephalopathy (HCC) Active Problems:   Substance abuse (HCC)   Coagulopathy (HCC)   Alcohol use disorder, moderate, dependence (HCC)   Irritability  Acute hepatic encephalopathy, resolved Elevated alcohol level -Mentation is returned back to normal.  CT of the head is negative.  Ammonia  levels have trended down.  Advised him to quit drinking. -Previous right upper quadrant ultrasound reviewed showing hepatic steatosis, lipid levels elevated therefore started on atorvastatin.  Advised to follow-up outpatient with PCP in about 2-4 weeks and get repeat lab work. -We will prescribe lactulose to be taken as needed for encephalopathy. -Thiamine and folic acid prescribed.  Advised to take daily vitamins over-the-counter.  History of hepatitis C-follow-up outpatient PCP  Consultations:  None  Subjective: Feels much better back to himself.  No complaints.  Walking around well with physical therapy  Discharge Exam: Vitals:   04/02/19 0517 04/02/19 0748  BP: 125/88 (!) 129/95  Pulse: 76 70  Resp: 20 17  Temp: 97.9 F (36.6 C) 98.2 F (36.8 C)  SpO2: 100% 100%   Vitals:   04/01/19 1753 04/01/19 2050 04/02/19 0517 04/02/19 0748  BP: (!) 134/97 130/89 125/88 (!) 129/95  Pulse: 90 81 76 70  Resp:  20 20 17   Temp:  98.4 F (36.9 C) 97.9 F (36.6 C) 98.2 F (36.8 C)  TempSrc:  Oral Oral Oral  SpO2:  100% 100% 100%  Weight:   63.3 kg   Height:        General: Pt is alert, awake, not in acute distress Cardiovascular: RRR, S1/S2 +, no rubs, no gallops Respiratory: CTA bilaterally, no wheezing, no rhonchi Abdominal: Soft, NT, ND, bowel sounds + Extremities: no edema, no cyanosis Alert awake oriented X4  Discharge Instructions  Discharge Instructions    Discharge patient   Complete by: As directed    Discharge disposition: 01-Home or Self Care   Discharge patient date: 04/02/2019     Allergies as  of 04/02/2019   No Known Allergies     Medication List    TAKE these medications   atorvastatin 40 MG tablet Commonly known as: LIPITOR Take 1 tablet (40 mg total) by mouth daily at 6 PM.   folic acid 1 MG tablet Commonly known as: FOLVITE Take 1 tablet (1 mg total) by mouth daily.   lactulose 10 GM/15ML solution Commonly known as: CHRONULAC Take 15 mLs  (10 g total) by mouth 2 (two) times daily as needed for mild constipation (And confusion).   omeprazole 20 MG capsule Commonly known as: PRILOSEC Take 20 mg by mouth daily as needed.   thiamine 100 MG tablet Take 1 tablet (100 mg total) by mouth daily.      Follow-up Information    Lynnell Jude, MD. Schedule an appointment as soon as possible for a visit in 1 week(s).   Specialty: Family Medicine Contact information: [place] [address] [phone]          No Known Allergies  You were cared for by a hospitalist during your hospital stay. If you have any questions about your discharge medications or the care you received while you were in the hospital after you are discharged, you can call the unit and asked to speak with the hospitalist on call if the hospitalist that took care of you is not available. Once you are discharged, your primary care physician will handle any further medical issues. Please note that no refills for any discharge medications will be authorized once you are discharged, as it is imperative that you return to your primary care physician (or establish a relationship with a primary care physician if you do not have one) for your aftercare needs so that they can reassess your need for medications and monitor your lab values.   Procedures/Studies: Ct Head Wo Contrast  Result Date: 03/31/2019 CLINICAL DATA:  Altered mental status EXAM: CT HEAD WITHOUT CONTRAST TECHNIQUE: Contiguous axial images were obtained from the base of the skull through the vertex without intravenous contrast. COMPARISON:  CT dated 07/22/2015. FINDINGS: Brain: No evidence of acute infarction, hemorrhage, hydrocephalus, extra-axial collection or mass lesion/mass effect. Vascular: No hyperdense vessel or unexpected calcification. Skull: Normal. Negative for fracture or focal lesion. Sinuses/Orbits: No acute finding. Other: None. IMPRESSION: No acute intracranial abnormality.  Electronically Signed   By: Constance Holster M.D.   On: 03/31/2019 21:33     The results of significant diagnostics from this hospitalization (including imaging, microbiology, ancillary and laboratory) are listed below for reference.     Microbiology: Recent Results (from the past 240 hour(s))  SARS CORONAVIRUS 2 (TAT 6-24 HRS) Nasopharyngeal Nasopharyngeal Swab     Status: None   Collection Time: 04/01/19  1:03 AM   Specimen: Nasopharyngeal Swab  Result Value Ref Range Status   SARS Coronavirus 2 NEGATIVE NEGATIVE Final    Comment: (NOTE) SARS-CoV-2 target nucleic acids are NOT DETECTED. The SARS-CoV-2 RNA is generally detectable in upper and lower respiratory specimens during the acute phase of infection. Negative results do not preclude SARS-CoV-2 infection, do not rule out co-infections with other pathogens, and should not be used as the sole basis for treatment or other patient management decisions. Negative results must be combined with clinical observations, patient history, and epidemiological information. The expected result is Negative. Fact Sheet for Patients: SugarRoll.be Fact Sheet for Healthcare Providers: https://www.woods-mathews.com/ This test is not yet approved or cleared by the [place] and  has been authorized  for detection and/or diagnosis of SARS-CoV-2 by FDA under an Emergency Use Authorization (EUA). This EUA will remain  in effect (meaning this test can be used) for the duration of the COVID-19 declaration under Section 56 4(b)(1) of the Act, 21 U.S.C. section 360bbb-3(b)(1), unless the authorization is terminated or revoked sooner. Performed at [facility], [address], [place], [place] [address]      Labs: BNP (last 3 results) No results for input(s): BNP in the last 8760 hours. Basic Metabolic Panel: Recent Labs  Lab 03/31/19 2031 04/01/19 0441 04/02/19 0452  NA 136 137 137  [place]anner  3.6 3.6 3.6  CL 101 108 111  CO2 22 19* 19*  GLUCOSE 108* 90 100*  BUN 14 13 20   CREATININE 0.73 0.69 0.87  CALCIUM 9.2 8.0* 8.5*  MG  --  2.2 1.9  PHOS  --  3.7  --    Liver Function Tests: Recent Labs  Lab 03/31/19 2031 04/01/19 0441 04/02/19 0452  AST 53* 42* 95*  ALT 50* 42 56*  ALKPHOS 83 64 76  BILITOT 1.0 0.8 1.3*  PROT 8.5* 6.9 6.7  ALBUMIN 4.7 3.7 3.5   Recent Labs  Lab 03/31/19 2031  LIPASE 56*   Recent Labs  Lab 03/31/19 2151 04/01/19 0441  AMMONIA 306* 52*   CBC: Recent Labs  Lab 03/31/19 2031 04/01/19 0441 04/02/19 0452  WBC 10.4 5.4 6.8  NEUTROABS 5.7  --   --   HGB 13.8 12.2* 12.1*  HCT 38.9* 35.2* 35.2*  MCV 88.4 89.6 90.7  PLT 227 206 180   Cardiac Enzymes: No results for input(s): CKTOTAL, CKMB, CKMBINDEX, TROPONINI in the last 168 hours. BNP: Invalid input(s): POCBNP CBG: Recent Labs  Lab 03/31/19 2035  GLUCAP 102*   D-Dimer No results for input(s): DDIMER in the last 72 hours. Hgb A1c No results for input(s): HGBA1C in the last 72 hours. Lipid Profile Recent Labs    04/01/19 0829  CHOL 181  HDL 33*  LDLCALC UNABLE TO CALCULATE IF TRIGLYCERIDE OVER 400 mg/dL  TRIG 13/01/20*  CHOLHDL 5.5  LDLDIRECT 33.8   Thyroid function studies Recent Labs    04/01/19 0441  TSH 2.845   Anemia work up Recent Labs    04/01/19 0441  VITAMINB12 272   Urinalysis    Component Value Date/Time   COLORURINE COLORLESS (A) 03/31/2019 2151   APPEARANCEUR CLEAR (A) 03/31/2019 2151   APPEARANCEUR Clear 10/13/2012 0351   LABSPEC 1.003 (L) 03/31/2019 2151   LABSPEC 1.003 10/13/2012 0351   PHURINE 7.0 03/31/2019 2151   GLUCOSEU NEGATIVE 03/31/2019 2151   GLUCOSEU Negative 10/13/2012 0351   HGBUR NEGATIVE 03/31/2019 2151   BILIRUBINUR NEGATIVE 03/31/2019 2151   BILIRUBINUR Negative 10/13/2012 0351   KETONESUR NEGATIVE 03/31/2019 2151   PROTEINUR NEGATIVE 03/31/2019 2151   NITRITE NEGATIVE 03/31/2019 2151   LEUKOCYTESUR NEGATIVE  03/31/2019 2151   LEUKOCYTESUR Negative 10/13/2012 0351   Sepsis Labs Invalid input(s): PROCALCITONIN,  WBC,  LACTICIDVEN Microbiology Recent Results (from the past 240 hour(s))  SARS CORONAVIRUS 2 (TAT 6-24 HRS) Nasopharyngeal Nasopharyngeal Swab     Status: None   Collection Time: 04/01/19  1:03 AM   Specimen: Nasopharyngeal Swab  Result Value Ref Range Status   SARS Coronavirus 2 NEGATIVE NEGATIVE Final    Comment: (NOTE) SARS-CoV-2 target nucleic acids are NOT DETECTED. The SARS-CoV-2 RNA is generally detectable in upper and lower respiratory specimens during the acute phase of infection. Negative results do not preclude SARS-CoV-2  infection, do not rule out co-infections with other pathogens, and should not be used as the sole basis for treatment or other patient management decisions. Negative results must be combined with clinical observations, patient history, and epidemiological information. The expected result is Negative. Fact Sheet for Patients: HairSlick.no Fact Sheet for Healthcare Providers: quierodirigir.com This test is not yet approved or cleared by the [place] FDA and  has been authorized for detection and/or diagnosis of SARS-CoV-2 by FDA under an Emergency Use Authorization (EUA). This EUA will remain  in effect (meaning this test can be used) for the duration of the COVID-19 declaration under Section 56 4(b)(1) of the Act, 21 U.S.C. section 360bbb-3(b)(1), unless the authorization is terminated or revoked sooner. Performed at [facility], [address] [place], [place] [address]      Time coordinating discharge:  I have spent 35 minutes face to face with the patient and on the ward discussing the patients care, assessment, plan and disposition with other care givers. >50% of the time was devoted counseling the patient about the risks and benefits of treatment/Discharge disposition and  coordinating care.   SIGNED:   Dimple Nanas, MD  [facility] 04/02/2019, 10:00 AM   If 7PM-7AM, please contact night-coverage

## 2019-04-08 LAB — VITAMIN B1: Vitamin B1 (Thiamine): 188.5 nmol/L (ref 66.5–200.0)

## 2019-05-04 ENCOUNTER — Emergency Department
Admission: EM | Admit: 2019-05-04 | Discharge: 2019-05-04 | Disposition: A | Discharge status: Home / Self Care | Payer: BC Managed Care – PPO | Attending: Emergency Medicine | Admitting: Emergency Medicine

## 2019-05-04 ENCOUNTER — Other Ambulatory Visit: Payer: Self-pay

## 2019-05-04 DIAGNOSIS — F17290 Nicotine dependence, other tobacco product, uncomplicated: Secondary | ICD-10-CM | POA: Diagnosis not present

## 2019-05-04 DIAGNOSIS — Z79899 Other long term (current) drug therapy: Secondary | ICD-10-CM | POA: Diagnosis not present

## 2019-05-04 DIAGNOSIS — R410 Disorientation, unspecified: Secondary | ICD-10-CM | POA: Insufficient documentation

## 2019-05-04 LAB — ETHANOL: Alcohol, Ethyl (B): <10 mg/dL (ref <10)

## 2019-05-04 LAB — COMPREHENSIVE METABOLIC PANEL
ALT: 58 U/L — ABNORMAL HIGH (ref 0–44)
AST: 83 U/L — ABNORMAL HIGH (ref 15–41)
Albumin: 4.6 g/dL (ref 3.5–5.0)
Alkaline Phosphatase: 101 U/L (ref 38–126)
Anion gap: 13 (ref 5–15)
BUN: 22 mg/dL — ABNORMAL HIGH (ref 6–20)
CO2: 19 mmol/L — ABNORMAL LOW (ref 22–32)
Calcium: 9.5 mg/dL (ref 8.9–10.3)
Chloride: 104 mmol/L (ref 98–111)
Creatinine, Ser: 0.74 mg/dL (ref 0.61–1.24)
GFR calc Af Amer: >60 mL/min (ref >60)
GFR calc non Af Amer: >60 mL/min (ref >60)
Glucose, Bld: 103 mg/dL — ABNORMAL HIGH (ref 70–99)
Potassium: 3.2 mmol/L — ABNORMAL LOW (ref 3.5–5.1)
Sodium: 136 mmol/L (ref 135–145)
Total Bilirubin: 1.4 mg/dL — ABNORMAL HIGH (ref 0.3–1.2)
Total Protein: 8.5 g/dL — ABNORMAL HIGH (ref 6.5–8.1)

## 2019-05-04 LAB — URINE DRUG SCREEN, QUALITATIVE (ARMC ONLY)
Amphetamines, Ur Screen: NOT DETECTED
Barbiturates, Ur Screen: NOT DETECTED
Benzodiazepine, Ur Scrn: NOT DETECTED
Cannabinoid 50 Ng, Ur ~~LOC~~: POSITIVE — AB
Cocaine Metabolite,Ur ~~LOC~~: NOT DETECTED
MDMA (Ecstasy)Ur Screen: NOT DETECTED
Methadone Scn, Ur: NOT DETECTED
Opiate, Ur Screen: NOT DETECTED
Phencyclidine (PCP) Ur S: NOT DETECTED
Tricyclic, Ur Screen: NOT DETECTED

## 2019-05-04 LAB — CBC
HCT: 35.7 % — ABNORMAL LOW (ref 39.0–52.0)
Hemoglobin: 13.3 g/dL (ref 13.0–17.0)
MCH: 32 pg (ref 26.0–34.0)
MCHC: 37.3 g/dL — ABNORMAL HIGH (ref 30.0–36.0)
MCV: 86 fL (ref 80.0–100.0)
Platelets: 234 10*3/uL (ref 150–400)
RBC: 4.15 MIL/uL — ABNORMAL LOW (ref 4.22–5.81)
RDW: 13.4 % (ref 11.5–15.5)
WBC: 8.7 10*3/uL (ref 4.0–10.5)
nRBC: 0 % (ref 0.0–0.2)

## 2019-05-04 LAB — AMMONIA: Ammonia: 33 umol/L (ref 9–35)

## 2019-05-04 NOTE — ED Notes (Signed)
[place] RN aware of pt in room

## 2019-05-04 NOTE — ED Notes (Signed)
Pt denies any symptoms indicative of delirium tremors. PT in NAD, calm and cooperative

## 2019-05-04 NOTE — ED Provider Notes (Signed)
[facility] Emergency Department Provider Note   ____________________________________________   First MD Initiated Contact with Patient 05/04/19 2027     (approximate)  I have reviewed the triage vital signs and the nursing notes.   HISTORY  Chief Complaint Evaluation and alcohol withdrawals    HPI Joel BlancoJohn P Keatley Sr. is a 48 y.o. male history of previous heavy alcohol abuse, hepatitis, and encephalopathy  Patient came for evaluation of this morning his brother noticed that seem like he was little bit confused.  Reports that he feels perfectly fine, this morning is not sure why his brother felt that he was confused but he did.  He has been compliant with his medications and lactulose.  Patient does not himself feel confused   Patient does have a heavy history of alcohol abuse reports he drank about 18 cans of beer today since he was about 48 years old.  He does use marijuana from time to time as well  He was stopped drinking alcohol he reports about 2 weeks ago.  Is not any alcohol since.  He has been sober.  Denies any pain or recent infection.  Does report he was hospitalized with confusion due to problems with his liver but has been on his medications to be doing very well since that time  No numbness or weakness.  No headache.  Denies feeling confused at this point.  No facial droop or other symptoms  Past Medical History:  Diagnosis Date  . Chronic alcohol abuse   . Elevated transaminase level   . Hepatitis C   . Seizure (HCC)   . Tobacco use     Patient Active Problem List   Diagnosis Date Noted  . Substance abuse (HCC) 04/01/2019  . Coagulopathy (HCC) 04/01/2019  . Alcohol use disorder, moderate, dependence (HCC) 04/01/2019  . Irritability 04/01/2019  . Hepatic encephalopathy (HCC) 07/22/2015    Past Surgical History:  Procedure Laterality Date  . CARPAL TUNNEL RELEASE      Prior to Admission medications   Medication Sig Start Date  End Date Taking? Authorizing Provider  atorvastatin (LIPITOR) 40 MG tablet Take 1 tablet (40 mg total) by mouth daily at 6 PM. 04/02/19 07/01/19  Amin, Loura HaltAnkit Chirag, MD  folic acid (FOLVITE) 1 MG tablet Take 1 tablet (1 mg total) by mouth daily. 04/02/19   Amin, Loura HaltAnkit Chirag, MD  lactulose (CHRONULAC) 10 GM/15ML solution Take 15 mLs (10 g total) by mouth 2 (two) times daily as needed for mild constipation (And confusion). 04/02/19   Amin, Loura HaltAnkit Chirag, MD  omeprazole (PRILOSEC) 20 MG capsule Take 20 mg by mouth daily as needed.    [provider]    Allergies Patient has no known allergies.  No family history on file.  Social History Social History   Tobacco Use  . Smoking status: Current Every Day Smoker    Packs/day: 6.00    Types: Cigars  . Smokeless tobacco: Never Used  Substance Use Topics  . Alcohol use: Yes    Alcohol/week: 6.0 standard drinks    Types: 6 Cans of beer per week    Comment: 18  . Drug use: Yes    Types: Marijuana    Review of Systems Constitutional: No fever/chills Eyes: No visual changes. ENT: No sore throat. Cardiovascular: Denies chest pain. Respiratory: Denies shortness of breath. Gastrointestinal: No abdominal pain.   Skin: Negative for rash. Neurological: Negative for headaches, areas of focal weakness or numbness.    ____________________________________________  PHYSICAL EXAM:  VITAL SIGNS: ED Triage Vitals  Enc Vitals Group     BP 05/04/19 1804 (!) 136/98     Pulse Rate 05/04/19 1804 100     Resp 05/04/19 1804 16     Temp 05/04/19 1804 98.8 F (37.1 C)     Temp Source 05/04/19 1804 Oral     SpO2 05/04/19 1804 98 %     Weight --      Height --      Head Circumference --      Peak Flow --      Pain Score 05/04/19 1745 0     Pain Loc --      Pain Edu? --      Excl. in [place]? --     Constitutional: Alert and oriented. Well appearing and in no acute distress.  The patient is fully alert, in no distress well oriented.  Very  pleasant.  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 Eyes: Conjunctivae are normal. Head: Atraumatic. Nose: No congestion/rhinnorhea. Mouth/Throat: Mucous membranes are moist. Neck: No stridor.  Cardiovascular: Normal rate, regular rhythm. Grossly normal heart sounds.  Good peripheral circulation. Respiratory: Normal respiratory effort.  No retractions. Lungs CTAB. Gastrointestinal: Soft and nontender. No distention. Musculoskeletal: No lower extremity tenderness nor edema.  No asterixis.  No tremors. Neurologic:  Normal speech and language. No gross focal neurologic deficits are appreciated.  Skin:  Skin is warm, dry and intact. No rash noted. Psychiatric: Mood and affect are normal. Speech and behavior are normal.  ____________________________________________   LABS (all labs ordered are listed, but only abnormal results are displayed)  Labs Reviewed  COMPREHENSIVE METABOLIC PANEL - Abnormal; Notable for the following components:      Result Value   Potassium 3.2 (*)    CO2 19 (*)    Glucose, Bld 103 (*)    BUN 22 (*)    Total Protein 8.5 (*)    AST 83 (*)    ALT 58 (*)    Total Bilirubin 1.4 (*)    All other components within normal limits  CBC - Abnormal; Notable for the following components:   RBC 4.15 (*)    HCT 35.7 (*)    MCHC 37.3 (*)    All other components within normal limits  URINE DRUG SCREEN, QUALITATIVE ([facility] ONLY) - Abnormal; Notable for the following components:   Cannabinoid 50 Ng, Ur [place] POSITIVE (*)    All other components within normal limits  ETHANOL  AMMONIA   ____________________________________________  EKG   ____________________________________________  RADIOLOGY   ____________________________________________   PROCEDURES  Procedure(s) performed: None  Procedures  Critical Care performed: No  ____________________________________________   INITIAL IMPRESSION / ASSESSMENT AND PLAN / ED COURSE  Pertinent labs & imaging results that were  available during my care of the patient were reviewed by me and considered in my medical decision making (see chart for details).   Reported possible confusion this morning, history of encephalopathy but ammonia level reassuring and his clinical exam is normal without focal neurologic abnormality.  Patient denies any history of feeling confused himself reports his brother felt he was confused this morning.  Given he is completely asymptomatic fully oriented and appropriate at this time and reports compliance with lactulose and has a soon upcoming appoint with his primary care do not see reason to further evaluate beyond what has been done in the ER at this time.  Discussed with the patient is comfortable with plan to go home,  return if he does not start to experience confusion symptoms, or other new concerns arise  Return precautions and treatment recommendations and follow-up discussed with the patient who is agreeable with the plan.  Joel Blanco Sr. was evaluated in Emergency Department on 05/04/2019 for the symptoms described in the history of present illness. He was evaluated in the context of the global COVID-19 pandemic, which necessitated consideration that the patient might be at risk for infection with the SARS-CoV-2 virus that causes COVID-19. Institutional protocols and algorithms that pertain to the evaluation of patients at risk for COVID-19 are in a state of rapid change based on information released by regulatory bodies including the CDC and federal and state organizations. These policies and algorithms were followed during the patient's care in the ED.  Denies exposure to Covid      ____________________________________________   FINAL CLINICAL IMPRESSION(S) / ED DIAGNOSES  Final diagnoses:  Confusion with non-focal neuro exam  Confusion resolved      Note:  This document was prepared using Dragon voice recognition software and may include unintentional dictation errors        Sharyn Creamer, MD 05/04/19 2041

## 2019-05-04 NOTE — ED Triage Notes (Addendum)
Pt comes via POV with c/o evaluation. Pt states earlier today his brother stated he was confused and he wanted him to get checked out.  Pt states no confusion at this time. Pt denies any SI,HI or hallucinations. Pt denies any CP or SOB.  Pt is A*OX4. Pt states alcohol usage for 30-40 years and drinks about 18 cans of beer a day. Pt states for the last couple of weeks he hasn't had anything to drink or decreased his drinking. Pt unsure. Pt states marijuana use 3 days ago.  Pt unsure of exact day since last drink. Pt states he has been anxious and some stress at home.   Pt is calm and cooperative.

## 2019-05-04 NOTE — Discharge Instructions (Signed)
Continue to use your lactulose as prescribed. Follow-up with your doctor as planned.

## 2019-05-13 ENCOUNTER — Emergency Department: Payer: BC Managed Care – PPO

## 2019-05-13 ENCOUNTER — Other Ambulatory Visit: Payer: Self-pay

## 2019-05-13 ENCOUNTER — Inpatient Hospital Stay
Admission: EM | Admit: 2019-05-13 | Discharge: 2019-05-14 | DRG: 433 | Disposition: A | Discharge status: Home / Self Care | Payer: BC Managed Care – PPO | Attending: Internal Medicine | Admitting: Internal Medicine

## 2019-05-13 DIAGNOSIS — T68XXXD Hypothermia, subsequent encounter: Secondary | ICD-10-CM | POA: Diagnosis not present

## 2019-05-13 DIAGNOSIS — R030 Elevated blood-pressure reading, without diagnosis of hypertension: Secondary | ICD-10-CM | POA: Diagnosis present

## 2019-05-13 DIAGNOSIS — F1020 Alcohol dependence, uncomplicated: Secondary | ICD-10-CM | POA: Diagnosis not present

## 2019-05-13 DIAGNOSIS — T68XXXA Hypothermia, initial encounter: Secondary | ICD-10-CM | POA: Diagnosis present

## 2019-05-13 DIAGNOSIS — K760 Fatty (change of) liver, not elsewhere classified: Secondary | ICD-10-CM | POA: Diagnosis present

## 2019-05-13 DIAGNOSIS — K7682 Hepatic encephalopathy: Secondary | ICD-10-CM | POA: Diagnosis present

## 2019-05-13 DIAGNOSIS — F17290 Nicotine dependence, other tobacco product, uncomplicated: Secondary | ICD-10-CM | POA: Diagnosis present

## 2019-05-13 DIAGNOSIS — Z20828 Contact with and (suspected) exposure to other viral communicable diseases: Secondary | ICD-10-CM | POA: Diagnosis present

## 2019-05-13 DIAGNOSIS — B1920 Unspecified viral hepatitis C without hepatic coma: Secondary | ICD-10-CM | POA: Diagnosis present

## 2019-05-13 DIAGNOSIS — D689 Coagulation defect, unspecified: Secondary | ICD-10-CM | POA: Diagnosis present

## 2019-05-13 DIAGNOSIS — K7290 Hepatic failure, unspecified without coma: Secondary | ICD-10-CM | POA: Diagnosis present

## 2019-05-13 DIAGNOSIS — R7401 Elevation of levels of liver transaminase levels: Secondary | ICD-10-CM | POA: Diagnosis not present

## 2019-05-13 DIAGNOSIS — Y903 Blood alcohol level of 60-79 mg/100 ml: Secondary | ICD-10-CM | POA: Diagnosis present

## 2019-05-13 DIAGNOSIS — R680 Hypothermia, not associated with low environmental temperature: Secondary | ICD-10-CM | POA: Diagnosis present

## 2019-05-13 DIAGNOSIS — E7220 Disorder of urea cycle metabolism, unspecified: Secondary | ICD-10-CM

## 2019-05-13 DIAGNOSIS — K7200 Acute and subacute hepatic failure without coma: Secondary | ICD-10-CM | POA: Diagnosis not present

## 2019-05-13 DIAGNOSIS — Z79899 Other long term (current) drug therapy: Secondary | ICD-10-CM | POA: Diagnosis not present

## 2019-05-13 DIAGNOSIS — K7040 Alcoholic hepatic failure without coma: Secondary | ICD-10-CM | POA: Diagnosis present

## 2019-05-13 DIAGNOSIS — R4182 Altered mental status, unspecified: Secondary | ICD-10-CM

## 2019-05-13 LAB — CBC WITH DIFFERENTIAL/PLATELET
Abs Immature Granulocytes: 0.04 10*3/uL (ref 0.00–0.07)
Basophils Absolute: 0.1 10*3/uL (ref 0.0–0.1)
Basophils Relative: 1 %
Eosinophils Absolute: 0.2 10*3/uL (ref 0.0–0.5)
Eosinophils Relative: 3 %
HCT: 34.8 % — ABNORMAL LOW (ref 39.0–52.0)
Hemoglobin: 12.3 g/dL — ABNORMAL LOW (ref 13.0–17.0)
Immature Granulocytes: 1 %
Lymphocytes Relative: 33 %
Lymphs Abs: 2.2 10*3/uL (ref 0.7–4.0)
MCH: 31.9 pg (ref 26.0–34.0)
MCHC: 35.3 g/dL (ref 30.0–36.0)
MCV: 90.2 fL (ref 80.0–100.0)
Monocytes Absolute: 0.6 10*3/uL (ref 0.1–1.0)
Monocytes Relative: 8 %
Neutro Abs: 3.6 10*3/uL (ref 1.7–7.7)
Neutrophils Relative %: 54 %
Platelets: 234 10*3/uL (ref 150–400)
RBC: 3.86 MIL/uL — ABNORMAL LOW (ref 4.22–5.81)
RDW: 13.8 % (ref 11.5–15.5)
WBC: 6.7 10*3/uL (ref 4.0–10.5)
nRBC: 0 % (ref 0.0–0.2)

## 2019-05-13 LAB — LIPASE, BLOOD: Lipase: 31 U/L (ref 11–51)

## 2019-05-13 LAB — TROPONIN I (HIGH SENSITIVITY): Troponin I (High Sensitivity): 4 ng/L (ref <18)

## 2019-05-13 LAB — PROTIME-INR
INR: 1.2 (ref 0.8–1.2)
Prothrombin Time: 15.2 seconds (ref 11.4–15.2)

## 2019-05-13 LAB — CK: Total CK: 246 U/L (ref 49–397)

## 2019-05-13 LAB — COMPREHENSIVE METABOLIC PANEL
ALT: 47 U/L — ABNORMAL HIGH (ref 0–44)
AST: 53 U/L — ABNORMAL HIGH (ref 15–41)
Albumin: 4 g/dL (ref 3.5–5.0)
Alkaline Phosphatase: 76 U/L (ref 38–126)
Anion gap: 12 (ref 5–15)
BUN: 19 mg/dL (ref 6–20)
CO2: 17 mmol/L — ABNORMAL LOW (ref 22–32)
Calcium: 9 mg/dL (ref 8.9–10.3)
Chloride: 108 mmol/L (ref 98–111)
Creatinine, Ser: 0.81 mg/dL (ref 0.61–1.24)
GFR calc Af Amer: >60 mL/min (ref >60)
GFR calc non Af Amer: >60 mL/min (ref >60)
Glucose, Bld: 101 mg/dL — ABNORMAL HIGH (ref 70–99)
Potassium: 3.8 mmol/L (ref 3.5–5.1)
Sodium: 137 mmol/L (ref 135–145)
Total Bilirubin: 0.8 mg/dL (ref 0.3–1.2)
Total Protein: 7.8 g/dL (ref 6.5–8.1)

## 2019-05-13 LAB — SARS CORONAVIRUS 2 (TAT 6-24 HRS): SARS Coronavirus 2: NEGATIVE

## 2019-05-13 LAB — ETHANOL: Alcohol, Ethyl (B): 63 mg/dL — ABNORMAL HIGH (ref <10)

## 2019-05-13 LAB — AMMONIA: Ammonia: 115 umol/L — ABNORMAL HIGH (ref 9–35)

## 2019-05-13 MED ORDER — THIAMINE HCL 100 MG/ML IJ SOLN
Freq: Once | INTRAVENOUS | Status: AC
  Administered 2019-05-13: 05:00:00 via INTRAVENOUS
  Filled 2019-05-13: qty 1000

## 2019-05-13 MED ORDER — LACTULOSE ENEMA: 300.0000 mL | Freq: Two times a day (BID) | ORAL | Status: DC

## 2019-05-13 MED ORDER — LORAZEPAM 1 MG PO TABS: 1.0000 mg | ORAL_TABLET | ORAL | Status: DC | PRN

## 2019-05-13 MED ORDER — LACTULOSE ENEMA
300.0000 mL | Freq: Two times a day (BID) | ORAL | Status: DC
  Filled 2019-05-13: qty 300

## 2019-05-13 MED ORDER — THIAMINE HCL 100 MG/ML IJ SOLN
100.0000 mg | Freq: Every day | INTRAMUSCULAR | Status: DC
  Administered 2019-05-13: 13:00:00 100 mg via INTRAVENOUS
  Filled 2019-05-13: qty 2

## 2019-05-13 MED ORDER — SODIUM CHLORIDE 0.9% FLUSH
3.0000 mL | Freq: Two times a day (BID) | INTRAVENOUS | Status: DC
  Administered 2019-05-13 – 2019-05-14 (×3): 3 mL via INTRAVENOUS

## 2019-05-13 MED ORDER — LORAZEPAM 2 MG/ML IJ SOLN
1.0000 mg | Freq: Once | INTRAMUSCULAR | Status: AC
  Administered 2019-05-13: 1 mg via INTRAVENOUS
  Filled 2019-05-13: qty 1

## 2019-05-13 MED ORDER — LORAZEPAM 2 MG/ML IJ SOLN: 1.0000 mg | INTRAMUSCULAR | Status: DC | PRN

## 2019-05-13 MED ORDER — SODIUM CHLORIDE 0.9 % IV BOLUS
1000.0000 mL | Freq: Once | INTRAVENOUS | Status: AC
  Administered 2019-05-13: 03:00:00 1000 mL via INTRAVENOUS

## 2019-05-13 MED ORDER — LACTULOSE 10 GM/15ML PO SOLN
20.0000 g | Freq: Three times a day (TID) | ORAL | Status: DC
  Administered 2019-05-13 – 2019-05-14 (×3): 20 g via ORAL
  Filled 2019-05-13 (×3): qty 30

## 2019-05-13 MED ORDER — LACTULOSE ENEMA
300.0000 mL | Freq: Once | ORAL | Status: AC
  Administered 2019-05-13: 300 mL via RECTAL
  Filled 2019-05-13: qty 300

## 2019-05-13 MED ORDER — LORAZEPAM 2 MG/ML IJ SOLN
0.0000 mg | Freq: Four times a day (QID) | INTRAMUSCULAR | Status: DC
  Administered 2019-05-13: 05:00:00 2 mg via INTRAVENOUS
  Filled 2019-05-13: qty 1

## 2019-05-13 NOTE — Progress Notes (Signed)
PROGRESS NOTE                                                                                                                                                                                                             Patient Demographics:    Joel Tanner, is a 48 y.o. male, DOB - [birth year], FIE:[id number]  Admit date - 05/13/2019   Admitting Physician Athena Masse, MD  Outpatient Primary MD for the patient is Clemmie Krill, Lynnell Jude, MD  LOS - 0    Chief Complaint  Patient presents with  . Other    unresponsive       Brief Narrative 48 year old male with ongoing alcohol abuse, hep C with frequent ED visits and hospitalization for hepatic encephalopathy found unresponsive on the floor.  He was extremely lethargic on presentation, hypothermic with elevated blood pressure and ammonia level of 115.  Had mild transaminitis, INR of 1.2 and alcohol level of 63.  Urine drug screen positive for cannabinoids.  CT head unremarkable.  Patient given lactulose enema and admitted to telemetry.   Subjective:   Patient more awake this afternoon.  Denies any nausea vomiting, headache or dizziness.  Confused with date.  Reports having six beers on the day of admission.   Assessment  & Plan :    Principal Problem:   Acute hepatic encephalopathy Ongoing alcohol use.  Slowly improving.  More awake today but tremulous.  Switch lactulose to p.o. monitor on CIWA with high risk for alcohol withdrawal. Advance diet.  Wife interested in patient going to rehab.  PT consulted.  Active Problems: Hypothermia Upon presentation.  Currently resolved.  Stable on telemetry.  Labs reviewed and unremarkable.  COVID-19 still pending.    Alcohol use disorder, moderate, dependence ([place]) Plan as above.    Code Status : Full code  Family Communication  : None at bedside  Disposition Plan  : Pending clinical improvement, PT eval (home versus  SNF)  Barriers For Discharge : Active symptoms  Consults  : None  Procedures  : CT head  DVT Prophylaxis  :  Lovenox   Lab Results  Component Value Date   PLT 234 05/13/2019    Antibiotics  :    Anti-infectives (From admission, onward)   None        Objective:   Vitals:   05/13/19  0600 05/13/19 0753 05/13/19 0812 05/13/19 1225  BP: (!) 160/113 137/76 (!) 142/93 128/82  Pulse: (!) 105 97 89 86  Resp:   16 16  Temp:   97.8 F (36.6 C) 99 F (37.2 C)  TempSrc:   Oral Oral  SpO2: 96% 98% 98% 99%  Weight:      Height:        Wt Readings from Last 3 Encounters:  05/13/19 63.5 kg  04/02/19 63.3 kg  07/23/15 70 kg     Intake/Output Summary (Last 24 hours) at 05/13/2019 1323 Last data filed at 05/13/2019 0457 Gross per 24 hour  Intake 1000 ml  Output --  Net 1000 ml     Physical Exam  Gen: Patient awake and alert, not in distress HEENT:  moist mucosa, supple neck Chest: clear b/l, no added sounds CVS: N S1&S2, no murmurs, GI: soft, NT, ND, BS+ Musculoskeletal: warm, no edema CNS: Alert and oriented x2 (confused with date), flapping tremors    Data Review:    CBC Recent Labs  Lab 05/13/19 0301  WBC 6.7  HGB 12.3*  HCT 34.8*  PLT 234  MCV 90.2  MCH 31.9  MCHC 35.3  RDW 13.8  LYMPHSABS 2.2  MONOABS 0.6  EOSABS 0.2  BASOSABS 0.1    Chemistries  Recent Labs  Lab 05/13/19 0301  NA 137  K 3.8  CL 108  CO2 17*  GLUCOSE 101*  BUN 19  CREATININE 0.81  CALCIUM 9.0  AST 53*  ALT 47*  ALKPHOS 76  BILITOT 0.8   ------------------------------------------------------------------------------------------------------------------ No results for input(s): CHOL, HDL, LDLCALC, TRIG, CHOLHDL, LDLDIRECT in the last 72 hours.  No results found for: HGBA1C ------------------------------------------------------------------------------------------------------------------ No results for input(s): TSH, T4TOTAL, T3FREE, THYROIDAB in the last 72  hours.  Invalid input(s): FREET3 ------------------------------------------------------------------------------------------------------------------ No results for input(s): VITAMINB12, FOLATE, FERRITIN, TIBC, IRON, RETICCTPCT in the last 72 hours.  Coagulation profile Recent Labs  Lab 05/13/19 0301  INR 1.2    No results for input(s): DDIMER in the last 72 hours.  Cardiac Enzymes No results for input(s): CKMB, TROPONINI, MYOGLOBIN in the last 168 hours.  Invalid input(s): CK ------------------------------------------------------------------------------------------------------------------ No results found for: BNP  Inpatient Medications  Scheduled Meds: . lactulose  300 mL Rectal BID  . LORazepam  0-4 mg Intravenous Q6H  . sodium chloride flush  3 mL Intravenous Q12H  . thiamine  100 mg Intravenous Daily   Continuous Infusions: PRN Meds:.LORazepam **OR** LORazepam  Micro Results No results found for this or any previous visit (from the past 240 hour(s)).  Radiology Reports CT Head Wo Contrast  Result Date: 05/13/2019 CLINICAL DATA:  Found unresponsive on floor EXAM: CT HEAD WITHOUT CONTRAST TECHNIQUE: Contiguous axial images were obtained from the base of the skull through the vertex without intravenous contrast. COMPARISON:  03/31/2019 FINDINGS: Brain: No evidence of acute infarction, hemorrhage, hydrocephalus, extra-axial collection or mass lesion/mass effect. Vascular: No hyperdense vessel or unexpected calcification. Skull: Normal. Negative for fracture or focal lesion. Sinuses/Orbits: No acute finding. Other: None. IMPRESSION: No acute abnormality noted. Electronically Signed   By: Alcide Clever M.D.   On: 05/13/2019 03:40   DG Chest Port 1 View  Result Date: 05/13/2019 CLINICAL DATA:  Found unresponsive EXAM: PORTABLE CHEST 1 VIEW COMPARISON:  08/30/2011 FINDINGS: Cardiac shadow is stable. The lungs are well aerated bilaterally. No focal infiltrate or sizable effusion  is seen. No bony is noted. IMPRESSION: No active disease. Electronically Signed   By: Eulah Pont.D.  On: 05/13/2019 03:21    Time Spent in minutes  25   Joel Tanner M.D on 05/13/2019 at 1:23 PM  Between 7am to 7pm - Pager - [phone]  After 7pm go to [email or web address] - password [email or web address]  [facility] -  Office  [phone]

## 2019-05-13 NOTE — ED Triage Notes (Signed)
Pt arrives EMS from home. Wife found pt unresponsive on floor. Reports pt has hx of heavy drinking and high ammonia levels.  EMS reports pt has slight eye opening with sternal rub. Pt noted to have abrasions on forehead.  O2 98% room air 18G L hand

## 2019-05-13 NOTE — ED Notes (Signed)
Pt cleaned up and linens changed.

## 2019-05-13 NOTE — ED Notes (Signed)
Pt belongings placed in bag and taken upstairs with pt

## 2019-05-13 NOTE — ED Notes (Signed)
Pt up to bathroom at this time. Multiple RNs needed for assistance. Brief placed on pt and stool cleaned from floor.

## 2019-05-13 NOTE — H&P (Signed)
History and Physical    Joel Tanner ZTI:[id number] DOB: April 09, 1971 DOA: 05/13/2019  PCP: Lynnell Jude, MD (  Chief Complaint:found unresponsive   HPI: Joel Lunger Sr. is a 48 y.o. male with a history of alcohol abuse and hepatitis C, with frequent visits and admission for alcohol related issues who was brought in after he was found unresponsive on the floor after she heard a thud like he had fallen. Patient is very lethargic and unable to contribute to the history. In the ER he was hypothermic, with a slightly elevated BP.Ammonia level was 115. AST/ALT was 53/47, bicarb 17, troponin 4, INR 1.2. serum alcohol 63, Utox with cannabinoids. CT head no acute findings. He was given a dose of lactulose and admission requested. Patient was placed in a bear hugger for his hypothermia.  Review of Systems: unable to perform due to altered mental status  Past Medical History:  Diagnosis Date  . Chronic alcohol abuse   . Elevated transaminase level   . Hepatitis C   . Seizure ([place])   . Tobacco use     Past Surgical History:  Procedure Laterality Date  . CARPAL TUNNEL RELEASE       reports that he has been smoking cigars. He has been smoking about 6.00 packs per day. He has never used smokeless tobacco. He reports current alcohol use of about 6.0 standard drinks of alcohol per week. He reports current drug use. Drug: Marijuana.  No Known Allergies  No family history on file. unable to obtain from patient at this time   Prior to Admission medications   Medication Sig Start Date End Date Taking? Authorizing Provider  atorvastatin (LIPITOR) 40 MG tablet Take 1 tablet (40 mg total) by mouth daily at 6 PM. 04/02/19 07/01/19  Amin, Jeanella Flattery, MD  folic acid (FOLVITE) 1 MG tablet Take 1 tablet (1 mg total) by mouth daily. 04/02/19   Amin, Jeanella Flattery, MD  lactulose (CHRONULAC) 10 GM/15ML solution Take 15 mLs (10 g total) by mouth 2 (two) times daily as needed for mild constipation (And  confusion). 04/02/19   Amin, Jeanella Flattery, MD  omeprazole (PRILOSEC) 20 MG capsule Take 20 mg by mouth daily as needed.    [provider]    Physical Exam: Vitals:   05/13/19 0305 05/13/19 0319 05/13/19 0430  BP:  (!) 147/106 (!) 134/91  Pulse:  76 72  Resp:  12 14  Temp:  (!) 94.5 F (34.7 C)   TempSrc:  Rectal   SpO2:  100% 98%  Weight: 63.5 kg    Height: 5\' 10"  (1.778 m)      Constitutional: NAD, calm, comfortable Vitals:   05/13/19 0305 05/13/19 0319 05/13/19 0430  BP:  (!) 147/106 (!) 134/91  Pulse:  76 72  Resp:  12 14  Temp:  (!) 94.5 F (34.7 C)   TempSrc:  Rectal   SpO2:  100% 98%  Weight: 63.5 kg    Height: 5\' 10"  (1.778 m)     Patient lethargic, only slightly arousable Eyes: PERRL ENMT: Mucous membranes are moist. Posterior pharynx clear of any exudate or lesions.Normal dentition.  Neck: normal, supple, no masses, no thyromegaly Respiratory: clear to auscultation bilaterally, no wheezing, no crackles. Normal respiratory effort. No accessory muscle use.  Cardiovascular: Regular rate and rhythm, no murmurs / rubs / gallops. No extremity edema. 2+ pedal pulses. No carotid bruits.  Abdomen: no tenderness, no masses palpated. No hepatosplenomegaly. Bowel sounds positive.  Neurologic:  patient lethargic but no gross focal neurologic deficit  Labs on Admission: I have personally reviewed following labs and imaging studies  CBC: Recent Labs  Lab 05/13/19 0301  WBC 6.7  NEUTROABS 3.6  HGB 12.3*  HCT 34.8*  MCV 90.2  PLT 234   Basic Metabolic Panel: Recent Labs  Lab 05/13/19 0301  NA 137  K 3.8  CL 108  CO2 17*  GLUCOSE 101*  BUN 19  CREATININE 0.81  CALCIUM 9.0   GFR: Estimated Creatinine Clearance: 98 mL/min (by C-G formula based on SCr of 0.81 mg/dL). Liver Function Tests: Recent Labs  Lab 05/13/19 0301  AST 53*  ALT 47*  ALKPHOS 76  BILITOT 0.8  PROT 7.8  ALBUMIN 4.0   Recent Labs  Lab 05/13/19 0301  LIPASE 31   Recent  Labs  Lab 05/13/19 0304  AMMONIA 115*   Coagulation Profile: Recent Labs  Lab 05/13/19 0301  INR 1.2   Cardiac Enzymes: No results for input(s): CKTOTAL, CKMB, CKMBINDEX, TROPONINI in the last 168 hours. BNP (last 3 results) No results for input(s): PROBNP in the last 8760 hours. HbA1C: No results for input(s): HGBA1C in the last 72 hours. CBG: No results for input(s): GLUCAP in the last 168 hours. Lipid Profile: No results for input(s): CHOL, HDL, LDLCALC, TRIG, CHOLHDL, LDLDIRECT in the last 72 hours. Thyroid Function Tests: No results for input(s): TSH, T4TOTAL, FREET4, T3FREE, THYROIDAB in the last 72 hours. Anemia Panel: No results for input(s): VITAMINB12, FOLATE, FERRITIN, TIBC, IRON, RETICCTPCT in the last 72 hours. Urine analysis:    Component Value Date/Time   COLORURINE COLORLESS (A) 03/31/2019 2151   APPEARANCEUR CLEAR (A) 03/31/2019 2151   APPEARANCEUR Clear 10/13/2012 0351   LABSPEC 1.003 (L) 03/31/2019 2151   LABSPEC 1.003 10/13/2012 0351   PHURINE 7.0 03/31/2019 2151   GLUCOSEU NEGATIVE 03/31/2019 2151   GLUCOSEU Negative 10/13/2012 0351   HGBUR NEGATIVE 03/31/2019 2151   BILIRUBINUR NEGATIVE 03/31/2019 2151   BILIRUBINUR Negative 10/13/2012 0351   KETONESUR NEGATIVE 03/31/2019 2151   PROTEINUR NEGATIVE 03/31/2019 2151   NITRITE NEGATIVE 03/31/2019 2151   LEUKOCYTESUR NEGATIVE 03/31/2019 2151   LEUKOCYTESUR Negative 10/13/2012 0351    Radiological Exams on Admission: CT Head Wo Contrast  Result Date: 05/13/2019 CLINICAL DATA:  Found unresponsive on floor EXAM: CT HEAD WITHOUT CONTRAST TECHNIQUE: Contiguous axial images were obtained from the base of the skull through the vertex without intravenous contrast. COMPARISON:  03/31/2019 FINDINGS: Brain: No evidence of acute infarction, hemorrhage, hydrocephalus, extra-axial collection or mass lesion/mass effect. Vascular: No hyperdense vessel or unexpected calcification. Skull: Normal. Negative for fracture  or focal lesion. Sinuses/Orbits: No acute finding. Other: None. IMPRESSION: No acute abnormality noted. Electronically Signed   By: Alcide Clever M.D.   On: 05/13/2019 03:40   DG Chest Port 1 View  Result Date: 05/13/2019 CLINICAL DATA:  Found unresponsive EXAM: PORTABLE CHEST 1 VIEW COMPARISON:  08/30/2011 FINDINGS: Cardiac shadow is stable. The lungs are well aerated bilaterally. No focal infiltrate or sizable effusion is seen. No bony is noted. IMPRESSION: No active disease. Electronically Signed   By: Alcide Clever M.D.   On: 05/13/2019 03:21     Assessment/Plan    Acute hepatic encephalopathy --continue rectal lactulose until awake enough , then convert to oral --CT head unremarkable --keep NPO until more awake    Hypothermia --Likely related to exposure from obtunded state --continue bear hugger    Alcohol use disorder, moderate, dependence (HCC) --continue banana bag started in  the ER --Ciwa withdrawal protocol --seizure precautions  History of hepatits C --no acute issues, --lfts slightly elevated  Mild coagulopathy --hold lovenox prophylaxis and monitor for bleeding --no active bleeding at this time  Andris BaumannHazel V Alanzo Lamb MD [facility]   If 7PM-7AM, please contact night-coverage [email or web address] Password [email or web address]  05/13/2019, 4:37 AM

## 2019-05-13 NOTE — Progress Notes (Signed)
Noted solid outside food halfway eaten in pt room. Pt. wife must have brought it in. No coughing or choking noted/heard in pt. room.

## 2019-05-13 NOTE — Progress Notes (Signed)
Pt 's wife wants MD and social worker to call her. She is wanting to know if pt can be discharged to rehab due to this frequent admission. Per wife, pt needs help. She also wants to know if there is any med that can be taken to make pt not to drink anymore.

## 2019-05-13 NOTE — ED Provider Notes (Signed)
[facility] Emergency Department Provider Note   ____________________________________________   First MD Initiated Contact with Patient 05/13/19 0303     (approximate)  I have reviewed the triage vital signs and the nursing notes.   HISTORY  Chief Complaint Other (unresponsive)  Level V caveat: Limited by decreased LOC  HPI Joel BlancoJohn P Tyson Sr. is a 48 y.o. male brought to the ED from home via EMS with a chief complaint of decreased LOC.  Patient is an alcoholic, sleeps in a separate bedroom from his wife who heard a thud and found him on the floor with decreased LOC.  Wife told EMS that patient has had elevated ammonia levels in the past.  Also states he has been drinking today.  Rest of history is limited secondary to patient's decreased LOC.       Past Medical History:  Diagnosis Date  . Chronic alcohol abuse   . Elevated transaminase level   . Hepatitis C   . Seizure (HCC)   . Tobacco use     Patient Active Problem List   Diagnosis Date Noted  . Hypothermia 05/13/2019  . Substance abuse (HCC) 04/01/2019  . Coagulopathy (HCC) 04/01/2019  . Alcohol use disorder, moderate, dependence (HCC) 04/01/2019  . Irritability 04/01/2019  . Acute hepatic encephalopathy 07/22/2015    Past Surgical History:  Procedure Laterality Date  . CARPAL TUNNEL RELEASE      Prior to Admission medications   Medication Sig Start Date End Date Taking? Authorizing Provider  atorvastatin (LIPITOR) 40 MG tablet Take 1 tablet (40 mg total) by mouth daily at 6 PM. 04/02/19 07/01/19  Amin, Loura HaltAnkit Chirag, MD  folic acid (FOLVITE) 1 MG tablet Take 1 tablet (1 mg total) by mouth daily. 04/02/19   Amin, Loura HaltAnkit Chirag, MD  lactulose (CHRONULAC) 10 GM/15ML solution Take 15 mLs (10 g total) by mouth 2 (two) times daily as needed for mild constipation (And confusion). 04/02/19   Amin, Loura HaltAnkit Chirag, MD  omeprazole (PRILOSEC) 20 MG capsule Take 20 mg by mouth daily as needed.    [provider]    Allergies Patient has no known allergies.  No family history on file.  Social History Social History   Tobacco Use  . Smoking status: Current Every Day Smoker    Packs/day: 6.00    Types: Cigars  . Smokeless tobacco: Never Used  Substance Use Topics  . Alcohol use: Yes    Alcohol/week: 6.0 standard drinks    Types: 6 Cans of beer per week    Comment: 18  . Drug use: Yes    Types: Marijuana    Review of Systems  Constitutional: No fever/chills Eyes: No visual changes. ENT: No sore throat. Cardiovascular: Denies chest pain. Respiratory: Denies shortness of breath. Gastrointestinal: No abdominal pain.  No nausea, no vomiting.  No diarrhea.  No constipation. Genitourinary: Negative for dysuria. Musculoskeletal: Negative for back pain. Skin: Negative for rash. Neurological: Positive for decreased LOC.  Negative for headaches, focal weakness or numbness.   ____________________________________________   PHYSICAL EXAM:  VITAL SIGNS: ED Triage Vitals  Enc Vitals Group     BP      Pulse      Resp      Temp      Temp src      SpO2      Weight      Height      Head Circumference      Peak Flow  Pain Score      Pain Loc      Pain Edu?      Excl. in [place]?     Constitutional: Responsive to painful stimuli. Eyes: Conjunctivae are normal. PERRL. EOMI. Head: Atraumatic. Nose: Atraumatic. Mouth/Throat: Mucous membranes are moist.  No dental malocclusion. Neck: No stridor.  No step-offs or deformities noted. Cardiovascular: Normal rate, regular rhythm. Grossly normal heart sounds.  Good peripheral circulation. Respiratory: Normal respiratory effort.  No retractions. Lungs CTAB. Gastrointestinal: Soft and nontender to light or deep palpation. No distention. No abdominal bruits. No CVA tenderness. Musculoskeletal: No spinal tenderness to palpation.  Pelvis stable.  No lower extremity tenderness nor edema.  No joint effusions.  Neurologic:  Responds to painful stimuli.  No gross focal neurologic deficits are appreciated. MAEx4. Skin:  Skin is warm, dry and intact. No rash noted.  No petechiae. Psychiatric: Unable to assess.  ____________________________________________   LABS (all labs ordered are listed, but only abnormal results are displayed)  Labs Reviewed  AMMONIA - Abnormal; Notable for the following components:      Result Value   Ammonia 115 (*)    All other components within normal limits  CBC WITH DIFFERENTIAL/PLATELET - Abnormal; Notable for the following components:   RBC 3.86 (*)    Hemoglobin 12.3 (*)    HCT 34.8 (*)    All other components within normal limits  COMPREHENSIVE METABOLIC PANEL - Abnormal; Notable for the following components:   CO2 17 (*)    Glucose, Bld 101 (*)    AST 53 (*)    ALT 47 (*)    All other components within normal limits  ETHANOL - Abnormal; Notable for the following components:   Alcohol, Ethyl (B) 63 (*)    All other components within normal limits  SARS CORONAVIRUS 2 (TAT 6-24 HRS)  LIPASE, BLOOD  PROTIME-INR  CK  URINE DRUG SCREEN, QUALITATIVE ([facility] ONLY)  TROPONIN I (HIGH SENSITIVITY)   ____________________________________________  EKG  ED ECG REPORT I, Allizon Woznick J, the attending physician, personally viewed and interpreted this ECG.   Date: 05/13/2019  EKG Time: 0308  Rate: 84  Rhythm: normal EKG, normal sinus rhythm  Axis: Normal  Intervals:none  ST&T Change: Nonspecific  ____________________________________________  RADIOLOGY  ED MD interpretation: No ICH, no acute cardiopulmonary process  Official radiology report(s): CT Head Wo Contrast  Result Date: 05/13/2019 CLINICAL DATA:  Found unresponsive on floor EXAM: CT HEAD WITHOUT CONTRAST TECHNIQUE: Contiguous axial images were obtained from the base of the skull through the vertex without intravenous contrast. COMPARISON:  03/31/2019 FINDINGS: Brain: No evidence of acute infarction, hemorrhage,  hydrocephalus, extra-axial collection or mass lesion/mass effect. Vascular: No hyperdense vessel or unexpected calcification. Skull: Normal. Negative for fracture or focal lesion. Sinuses/Orbits: No acute finding. Other: None. IMPRESSION: No acute abnormality noted. Electronically Signed   By: Inez Catalina M.D.   On: 05/13/2019 03:40   DG Chest Port 1 View  Result Date: 05/13/2019 CLINICAL DATA:  Found unresponsive EXAM: PORTABLE CHEST 1 VIEW COMPARISON:  08/30/2011 FINDINGS: Cardiac shadow is stable. The lungs are well aerated bilaterally. No focal infiltrate or sizable effusion is seen. No bony is noted. IMPRESSION: No active disease. Electronically Signed   By: Inez Catalina M.D.   On: 05/13/2019 03:21    ____________________________________________   PROCEDURES  Procedure(s) performed (including Critical Care):  Procedures  CRITICAL CARE Performed by: Paulette Blanch   Total critical care time: 30 minutes  Critical care time was exclusive of  separately billable procedures and treating other patients.  Critical care was necessary to treat or prevent imminent or life-threatening deterioration.  Critical care was time spent personally by me on the following activities: development of treatment plan with patient and/or surrogate as well as nursing, discussions with consultants, evaluation of patient's response to treatment, examination of patient, obtaining history from patient or surrogate, ordering and performing treatments and interventions, ordering and review of laboratory studies, ordering and review of radiographic studies, pulse oximetry and re-evaluation of patient's condition.  ____________________________________________   INITIAL IMPRESSION / ASSESSMENT AND PLAN / ED COURSE  As part of my medical decision making, I reviewed the following data within the electronic MEDICAL RECORD NUMBER Nursing notes reviewed and incorporated, Labs reviewed, EKG interpreted, Old chart reviewed,  Radiograph reviewed and Notes from prior ED visits     ARRIN PINTOR Sr. was evaluated in Emergency Department on 05/13/2019 for the symptoms described in the history of present illness. He was evaluated in the context of the global COVID-19 pandemic, which necessitated consideration that the patient might be at risk for infection with the SARS-CoV-2 virus that causes COVID-19. Institutional protocols and algorithms that pertain to the evaluation of patients at risk for COVID-19 are in a state of rapid change based on information released by regulatory bodies including the CDC and federal and state organizations. These policies and algorithms were followed during the patient's care in the ED.    48 year old alcoholic brought for decreased mentation. Differential diagnosis includes, but is not limited to, alcohol, illicit or prescription medications, or other toxic ingestion; intracranial pathology such as stroke or intracerebral hemorrhage; fever or infectious causes including sepsis; hypoxemia and/or hypercarbia; uremia; trauma; endocrine related disorders such as diabetes, hypoglycemia, and thyroid-related diseases; hypertensive encephalopathy; etc.  We will obtain lab work including ammonia and INR.  Obtain CT head, chest x-ray.  Initiate IV fluids, banana bag, place on CIWA scale.  Will reassess.   Clinical Course as of May 12 629  Sun May 13, 2019  7116 Ammonia noted.  Will start lactulose.  Will discuss case with hospitalist services for admission.   [JS]    Clinical Course User Index [JS] Irean Hong, MD     ____________________________________________   FINAL CLINICAL IMPRESSION(S) / ED DIAGNOSES  Final diagnoses:  Altered mental status, unspecified altered mental status type  Acute hepatic encephalopathy  Hyperammonemia (HCC)  Hypothermia, initial encounter     ED Discharge Orders    None       Note:  This document was prepared using Dragon voice recognition  software and may include unintentional dictation errors.   Irean Hong, MD 05/13/19 0630

## 2019-05-14 DIAGNOSIS — R7401 Elevation of levels of liver transaminase levels: Secondary | ICD-10-CM

## 2019-05-14 DIAGNOSIS — T68XXXD Hypothermia, subsequent encounter: Secondary | ICD-10-CM

## 2019-05-14 LAB — CBC
HCT: 34.5 % — ABNORMAL LOW (ref 39.0–52.0)
Hemoglobin: 11.9 g/dL — ABNORMAL LOW (ref 13.0–17.0)
MCH: 31.9 pg (ref 26.0–34.0)
MCHC: 34.5 g/dL (ref 30.0–36.0)
MCV: 92.5 fL (ref 80.0–100.0)
Platelets: 214 10*3/uL (ref 150–400)
RBC: 3.73 MIL/uL — ABNORMAL LOW (ref 4.22–5.81)
RDW: 13.9 % (ref 11.5–15.5)
WBC: 7.9 10*3/uL (ref 4.0–10.5)
nRBC: 0 % (ref 0.0–0.2)

## 2019-05-14 LAB — COMPREHENSIVE METABOLIC PANEL
ALT: 46 U/L — ABNORMAL HIGH (ref 0–44)
AST: 62 U/L — ABNORMAL HIGH (ref 15–41)
Albumin: 3.5 g/dL (ref 3.5–5.0)
Alkaline Phosphatase: 72 U/L (ref 38–126)
Anion gap: 8 (ref 5–15)
BUN: 17 mg/dL (ref 6–20)
CO2: 18 mmol/L — ABNORMAL LOW (ref 22–32)
Calcium: 8.4 mg/dL — ABNORMAL LOW (ref 8.9–10.3)
Chloride: 112 mmol/L — ABNORMAL HIGH (ref 98–111)
Creatinine, Ser: 0.74 mg/dL (ref 0.61–1.24)
GFR calc Af Amer: >60 mL/min (ref >60)
GFR calc non Af Amer: >60 mL/min (ref >60)
Glucose, Bld: 97 mg/dL (ref 70–99)
Potassium: 3.6 mmol/L (ref 3.5–5.1)
Sodium: 138 mmol/L (ref 135–145)
Total Bilirubin: 1 mg/dL (ref 0.3–1.2)
Total Protein: 6.9 g/dL (ref 6.5–8.1)

## 2019-05-14 MED ORDER — VITAMIN B-1 50 MG PO TABS
100.0000 mg | ORAL_TABLET | Freq: Every day | ORAL | Status: DC
  Administered 2019-05-14: 100 mg via ORAL
  Filled 2019-05-14: qty 2

## 2019-05-14 MED ORDER — ENOXAPARIN SODIUM 40 MG/0.4ML ~~LOC~~ SOLN
40.0000 mg | SUBCUTANEOUS | Status: DC
  Administered 2019-05-14: 40 mg via SUBCUTANEOUS
  Filled 2019-05-14: qty 0.4

## 2019-05-14 MED ORDER — THIAMINE HCL 100 MG PO TABS: 100.0000 mg | ORAL_TABLET | Freq: Every day | ORAL | 0 refills | Status: DC

## 2019-05-14 MED ORDER — MULTI-VITAMIN/MINERALS PO TABS: 1.0000 | ORAL_TABLET | Freq: Every day | ORAL | 2 refills | Status: DC

## 2019-05-14 MED ORDER — FOLIC ACID 1 MG PO TABS: 1.0000 mg | ORAL_TABLET | Freq: Every day | ORAL | 0 refills | Status: AC

## 2019-05-14 NOTE — TOC Initial Note (Signed)
Transition of Care (TOC) - Initial/Assessment Note    Patient Details  Name: Joel KOUDELKA Sr. MRN: [id number] Date of Birth: 02/15/1971  Transition of Care [facility]) CM/SW Contact:    Beverly Sessions, RN Phone Number: 05/14/2019, 5:49 PM  Clinical Narrative:                 Patient provided outpatient and residential resources for substance abuse at discharge.   RNCM spoke with wife via phone and offered to email her a list of resources as well.  She declined and states she was going to allow the patient to follow up himself should he chose to do so.         Patient Goals and CMS Choice        Expected Discharge Plan and Services           Expected Discharge Date: 05/14/19                                    Prior Living Arrangements/Services                       Activities of Daily Living Home Assistive Devices/Equipment: None ADL Screening (condition at time of admission) Patient's cognitive ability adequate to safely complete daily activities?: Yes Is the patient deaf or have difficulty hearing?: No Does the patient have difficulty seeing, even when wearing glasses/contacts?: No Does the patient have difficulty concentrating, remembering, or making decisions?: Yes Patient able to express need for assistance with ADLs?: Yes Does the patient have difficulty dressing or bathing?: No Independently performs ADLs?: Yes (appropriate for developmental age) Does the patient have difficulty walking or climbing stairs?: No Weakness of Legs: None Weakness of Arms/Hands: None  Permission Sought/Granted                  Emotional Assessment              Admission diagnosis:  Hepatic encephalopathy ([place]) [K72.90] Hyperammonemia ([place]) [E72.20] Acute hepatic encephalopathy [K72.00] Hypothermia, initial encounter [T68.XXXA] Altered mental status, unspecified altered mental status type [R41.82] Patient Active Problem List   Diagnosis Date Noted   . Hypothermia 05/13/2019  . Substance abuse ([place]) 04/01/2019  . Coagulopathy ([place]) 04/01/2019  . Alcohol use disorder, moderate, dependence ([place]) 04/01/2019  . Irritability 04/01/2019  . Acute hepatic encephalopathy 07/22/2015   PCP:  Lynnell Jude, MD Pharmacy:   [place], [address] [place] [address] Phone: [phone] Fax: [phone]  [id number] - [place], [address] [place] [address] Phone: [phone] Fax: [phone]     Social Determinants of Health (SDOH) Interventions    Readmission Risk Interventions No flowsheet data found.

## 2019-05-14 NOTE — Evaluation (Signed)
Physical Therapy Evaluation Patient Details Name: Joel UTTER Sr. MRN: [id number] DOB: July 21, 1970 Today's Date: 05/14/2019   History of Present Illness  Patient is a pleasant 48 year old male admitted to hospital for acute hepatic encephalopathy. Patient was found on floor by wife after hearing a sudden "thud", patient nonresponsive and hypothermic. PMH includes Hep C, chronic alchohol abuse, seizures, and mild coagulopathy, hepatic encephalopathy, and CTR.  Clinical Impression  Patient is a pleasant 48 year old male admitted for acute hepatic encephalopathy. Patient is sitting cross legged in bed upon PT arrival and is eager to participate in PT. He follows simple and complex commands and is oriented and alert x 4 with PT questioning. Patient demonstrates independent bed mobility. Transfers and ambulation performed with CGA due to patient's fall prior to admission however patient did not demonstrate any instance of instability throughout session. Patient ambulated with functional gait speed and mechanics performing two laps around nursing station without fatigue. Seated strengthening interventions performed with patient demonstrating understanding. Due to patient being at baseline per his report and demonstrating no functional deficits of balance and strength physical therapy is not needed at this time. We will be happy to follow up with patient again as needed.     Follow Up Recommendations No PT follow up    Equipment Recommendations       Recommendations for Other Services       Precautions / Restrictions Precautions Precautions: Fall Restrictions Weight Bearing Restrictions: No      Mobility  Bed Mobility Overal bed mobility: Independent             General bed mobility comments: no use of hands required for getting into/out of bed  Transfers Overall transfer level: Independent               General transfer comment: Use of CGA with STS transfer due to history of  falling however patient demonstrates independence with transfer  Ambulation/Gait Ambulation/Gait assistance: Min guard Gait Distance (Feet): 260 Feet Assistive device: None Gait Pattern/deviations: WFL(Within Functional Limits) Gait velocity: >1.0 m/s   General Gait Details: Patient is a functional ambulator with gait speed >1.0 m/s, CGA due to history of fall, no evidence of LOB or unsteadiness with ambulation, able to weave around obstacles and look at people without LOB.  Stairs            Wheelchair Mobility    Modified Rankin (Stroke Patients Only)       Balance Overall balance assessment: Independent                                           Pertinent Vitals/Pain Pain Assessment: No/denies pain    Home Living Family/patient expects to be discharged to:: Private residence Living Arrangements: Spouse/significant other(wife lives in seperate bedroom) Available Help at Discharge: Family Type of Home: House Home Access: Stairs to enter Entrance Stairs-Rails: Right;Left;Can reach both Secretary/administrator of Steps: 3 Home Layout: One level Home Equipment: Grab bars - tub/shower Additional Comments: Patient non ambulatory    Prior Function Level of Independence: Independent         Comments: Patient works as a Nutritional therapist. reports independence with all mobility/ ADL's     Hand Dominance        Extremity/Trunk Assessment   Upper Extremity Assessment Upper Extremity Assessment: Overall WFL for tasks assessed    Lower  Extremity Assessment Lower Extremity Assessment: Overall WFL for tasks assessed(grossly 4+/5 hips, 5/5 knees, 4+/5 ankles)    Cervical / Trunk Assessment Cervical / Trunk Assessment: Normal  Communication   Communication: No difficulties  Cognition Arousal/Alertness: Awake/alert Behavior During Therapy: WFL for tasks assessed/performed Overall Cognitive Status: Within Functional Limits for tasks assessed                                  General Comments: Patient eager to participate with PT, A and O x 4, very pleasant.      General Comments General comments (skin integrity, edema, etc.): Patient appears well nourished, slightly unkept hair, normal affect.    Exercises Other Exercises Other Exercises: seated exercise for strengthening: seated marching with upright posture 10x each LE for core activation, LAQ with 3 second holds with upright hold 10x each LE, ankle circles 10x each LE Other Exercises: Patient educated in safe transfers and mobility demonstrating understanding   Assessment/Plan    PT Assessment Patent does not need any further PT services  PT Problem List         PT Treatment Interventions      PT Goals (Current goals can be found in the Care Plan section)  Acute Rehab PT Goals Patient Stated Goal: to return home PT Goal Formulation: With patient Time For Goal Achievement: 05/28/19 Potential to Achieve Goals: Fair    Frequency     Barriers to discharge        Co-evaluation               AM-PAC PT "6 Clicks" Mobility  Outcome Measure Help needed turning from your back to your side while in a flat bed without using bedrails?: None Help needed moving from lying on your back to sitting on the side of a flat bed without using bedrails?: None Help needed moving to and from a bed to a chair (including a wheelchair)?: None Help needed standing up from a chair using your arms (e.g., wheelchair or bedside chair)?: None Help needed to walk in hospital room?: None Help needed climbing 3-5 steps with a railing? : A Little 6 Click Score: 23    End of Session Equipment Utilized During Treatment: Gait belt Activity Tolerance: Patient tolerated treatment well Patient left: in chair;with call bell/phone within reach;with chair alarm set Nurse Communication: Mobility status PT Visit Diagnosis: History of falling (Z91.81)    Time: 4098-1191 PT Time Calculation  (min) (ACUTE ONLY): 16 min   Charges:   PT Evaluation $PT Eval Low Complexity: 1 Low          Janna Arch, PT, DPT    Janna Arch 05/14/2019, 10:35 AM

## 2019-05-14 NOTE — Discharge Instructions (Signed)
Hepatic Encephalopathy ° °Hepatic encephalopathy is a loss of brain function due to advanced liver disease. When the liver is damaged, harmful substances (toxins) can build up in the body. Some of these toxins, such as ammonia, can harm the brain. °The effects of the condition depend on the type of liver damage and how severe it is. In some cases, hepatic encephalopathy can be reversed. °What are the causes? °Certain things can trigger or worsen hepatic encephalopathy, such as: °· Infection. °· Constipation. °· Taking certain medicines, such as benzodiazepines. °· Alcohol use. °· Bleeding into the intestinal tract. °· Imbalances in minerals (electrolytes) in the body. °· Dehydration. °Hepatic encephalopathy can sometimes be reversed if these triggers are resolved. °What increases the risk? °You are at risk of developing this condition if you have advanced liver disease (cirrhosis). Conditions that can cause liver disease include: °· Infections in the liver, such as hepatitis C. °· Infections in the blood. °· Drinking a lot of alcohol over a long period of time. °· Taking certain medicines, including tranquilizers, diuretics, antidepressants, sleeping pills, or acetaminophen. °· Genetic diseases, such as Wilson's disease. °What are the signs or symptoms? °Symptoms may develop suddenly. Or, they may develop slowly and get worse gradually. Symptoms can range from mild to severe. °Mild symptoms include: °· Mild confusion. °· Shortened attention span. °· Personality and mood changes. °· Anxiety and agitation. °· Drowsiness. °Symptoms of worsening or severe hepatic encephalopathy include: °· Extreme confusion (disorientation). °· Slowed movement. °· Slurred speech. °· Extreme personality changes. °· Abnormal shaking or flapping of the hands. °· Coma. °How is this diagnosed? °This condition may be diagnosed based on: °· A physical exam. °· Your symptoms and medical history. °· Blood tests. These may be done to check levels  of ammonia in your blood, measure how long it takes your blood to clot, or check for infection. °· Liver function tests. These may be done to check how well your liver is working. °· MRI and CT scans. These may be done to check for a brain disorder and to check for problems with your liver. °· Electroencephalogram (EEG). This test measures the electrical activity in your brain. °How is this treated? °The first step in treatment is to identify and treat the cause of your liver damage or triggering illness, if possible. The next step is taking medicine to lower the level of toxins in your body and prevent ammonia from building up. Treatment will depend on how severe your encephalopathy is, and may include: °· Medicine to lower your ammonia level (lactulose). °· Antibiotic medicine to reduce the amount of ammonia-producing bacteria in your gut. °· Close monitoring of your blood pressure, heart rate, breathing, and oxygen levels. °· Removal of fluid from your abdomen. °· Close monitoring of how you think, feel, and act (mental status). °· Dietary changes. °· Liver transplant, in severe cases. °Follow these instructions at home: °Eating and drinking ° °· Work with a dietitian or with your health care provider to make sure you are getting the right balance of protein and minerals. °· Drink enough fluids to keep your urine pale yellow. °· Do not drink alcohol or use drugs. °General instructions °· If you were prescribed an antibiotic, take it as told by your health care provider. Do not stop taking the antibiotic even if your condition improves. °· Take other over-the-counter and prescription medicines only as told by your health care provider. °· Do not start taking any new medicines, including over-the-counter medicines, without first   checking with your health care provider.  Keep all follow-up visits as told by your health care provider. This is important. Contact a health care provider if:  You develop new  symptoms.  Your symptoms change or get worse.  You have a fever.  You are constipated. Signs of constipation include having: ? Fewer bowel movements in a week than normal. ? Trouble having a bowel movement. ? Stools that are dry, hard, or larger than normal.  You have persistent nausea, vomiting, or diarrhea. Get help right away if:  You become very confused or drowsy.  You vomit blood or material that looks like coffee grounds.  Your stool is bloody, black, or looks like tar. Summary  Hepatic encephalopathy is a loss of brain function due to advanced liver disease. When the liver is damaged, harmful substances (toxins) can build up in your body. Some of these toxins, such as ammonia, can harm your brain.  Certain things can trigger or worsen hepatic encephalopathy. Hepatic encephalopathy can sometimes be reversed if these triggers are resolved.  The first step in treatment is to identify and treat the cause of your liver damage or triggering illness, if possible. The next step is taking medicine to lower the level of toxins in your body and prevent ammonia from building up.  Your treatment will depend on how severe your hepatic encephalopathy is. This information is not intended to replace advice given to you by your health care provider. Make sure you discuss any questions you have with your health care provider. Document Released: 07/27/2006 Document Revised: 04/29/2017 Document Reviewed: 02/15/2017 Elsevier Patient Education  2020 [employer].    Alcohol Use Disorder Alcohol use disorder is when your drinking disrupts your daily life. When you have this condition, you drink too much alcohol and you cannot control your drinking. Alcohol use disorder can cause serious problems with your physical health. It can affect your brain, heart, liver, pancreas, immune system, stomach, and intestines. Alcohol use disorder can increase your risk for certain cancers and cause problems  with your mental health, such as depression, anxiety, psychosis, delirium, and dementia. People with this disorder risk hurting themselves and others. What are the causes? This condition is caused by drinking too much alcohol over time. It is not caused by drinking too much alcohol only one or two times. Some people with this condition drink alcohol to cope with or escape from negative life events. Others drink to relieve pain or symptoms of mental illness. What increases the risk? You are more likely to develop this condition if:  You have a family history of alcohol use disorder.  Your culture encourages drinking to the point of intoxication, or makes alcohol easy to get.  You had a mood or conduct disorder in childhood.  You have been a victim of abuse.  You are an adolescent and: ? You have poor grades or difficulties in school. ? Your caregivers do not talk to you about saying no to alcohol, or supervise your activities. ? You are impulsive or you have trouble with self-control. What are the signs or symptoms? Symptoms of this condition include:  Drinkingmore than you want to.  Drinking for longer than you want to.  Trying several times to drink less or to control your drinking.  Spending a lot of time getting alcohol, drinking, or recovering from drinking.  Craving alcohol.  Having problems at work, at school, or at home due to drinking.  Having problems in relationships due to drinking.  Drinking when it is dangerous to drink, such as before driving a car.  Continuing to drink even though you know you might have a physical or mental problem related to drinking.  Needing more and more alcohol to get the same effect you want from the alcohol (building up tolerance).  Having symptoms of withdrawal when you stop drinking. Symptoms of withdrawal include: ? Fatigue. ? Nightmares. ? Trouble sleeping. ? Depression. ? Anxiety. ? Fever. ? Seizures. ? Severe  confusion. ? Feeling or seeing things that are not there (hallucinations). ? Tremors. ? Rapid heart rate. ? Rapid breathing. ? High blood pressure.  Drinking to avoid symptoms of withdrawal. How is this diagnosed? This condition is diagnosed with an assessment. Your health care provider may start the assessment by asking three or four questions about your drinking. Your health care provider may perform a physical exam or do lab tests to see if you have physical problems resulting from alcohol use. She or he may refer you to a mental health professional for evaluation. How is this treated? Some people with alcohol use disorder are able to reduce their alcohol use to low-risk levels. Others need to completely quit drinking alcohol. When necessary, mental health professionals with specialized training in substance use treatment can help. Your health care provider can help you decide how severe your alcohol use disorder is and what type of treatment you need. The following forms of treatment are available:  Detoxification. Detoxification involves quitting drinking and using prescription medicines within the first week to help lessen withdrawal symptoms. This treatment is important for people who have had withdrawal symptoms before and for heavy drinkers who are likely to have withdrawal symptoms. Alcohol withdrawal can be dangerous, and in severe cases, it can cause death. Detoxification may be provided in a home, community, or primary care setting, or in a hospital or substance use treatment facility.  Counseling. This treatment is also called talk therapy. It is provided by substance use treatment counselors. A counselor can address the reasons you use alcohol and suggest ways to keep you from drinking again or to prevent problem drinking. The goals of talk therapy are to: ? Find healthy activities and ways for you to cope with stress. ? Identify and avoid the things that trigger your alcohol  use. ? Help you learn how to handle cravings.  Medicines.Medicines can help treat alcohol use disorder by: ? Decreasing alcohol cravings. ? Decreasing the positive feeling you have when you drink alcohol. ? Causing an uncomfortable physical reaction when you drink alcohol (aversion therapy).  Support groups. Support groups are led by people who have quit drinking. They provide emotional support, advice, and guidance. These forms of treatment are often combined. Some people with this condition benefit from a combination of treatments provided by specialized substance use treatment centers. Follow these instructions at home:  Take over-the-counter and prescription medicines only as told by your health care provider.  Check with your health care provider before starting any new medicines.  Ask friends and family members not to offer you alcohol.  Avoid situations where alcohol is served, including gatherings where others are drinking alcohol.  Create a plan for what to do when you are tempted to use alcohol.  Find hobbies or activities that you enjoy that do not include alcohol.  Keep all follow-up visits as told by your health care provider. This is important. How is this prevented?  If you drink, limit alcohol intake to no more than 1 drink  a day for nonpregnant women and 2 drinks a day for men. One drink equals 12 oz of beer, 5 oz of wine, or 1 oz of hard liquor.  If you have a mental health condition, get treatment and support.  Do not give alcohol to adolescents.  If you are an adolescent: ? Do not drink alcohol. ? Do not be afraid to say no if someone offers you alcohol. Speak up about why you do not want to drink. You can be a positive role model for your friends and set a good example for those around you by not drinking alcohol. ? If your friends drink, spend time with others who do not drink alcohol. Make new friends who do not use alcohol. ? Find healthy ways to manage  stress and emotions, such as meditation or deep breathing, exercise, spending time in nature, listening to music, or talking with a trusted friend or family member. Contact a health care provider if:  You are not able to take your medicines as told.  Your symptoms get worse.  You return to drinking alcohol (relapse) and your symptoms get worse. Get help right away if:  You have thoughts about hurting yourself or others. If you ever feel like you may hurt yourself or others, or have thoughts about taking your own life, get help right away. You can go to your nearest emergency department or call:  Your local emergency services (911 in the [place]  A suicide crisis helpline, such as the National Suicide Prevention Lifeline at 985-799-81091-7867986228. This is open 24 hours a day. Summary  Alcohol use disorder is when your drinking disrupts your daily life. When you have this condition, you drink too much alcohol and you cannot control your drinking.  Treatment may include detoxification, counseling, medicine, and support groups.  Ask friends and family members not to offer you alcohol. Avoid situations where alcohol is served.  Get help right away if you have thoughts about hurting yourself or others. This information is not intended to replace advice given to you by your health care provider. Make sure you discuss any questions you have with your health care provider. Document Released: 06/24/2004 Document Revised: 04/29/2017 Document Reviewed: 02/12/2016 Elsevier Patient Education  2020 [employer]

## 2019-05-14 NOTE — Progress Notes (Signed)
Pt discharged per MD order. IV removed. Discharge instructions reviewed with pt. Pt verbalized understanding. Pt given paper with outpatient resources for rehab. Pt taken downstairs in wheelchair by staff.

## 2019-05-14 NOTE — Discharge Summary (Addendum)
Physician Discharge Summary  Joel HELT Sr. ZOX:[id number] DOB: 1971/05/21 DOA: 05/13/2019  PCP: Dortha Kern, MD  Admit date: 05/13/2019 Discharge date: 05/14/2019  Admitted From: Home Disposition: Home  Recommendations for Outpatient Follow-up:  Follow-up with PCP in 1 week  Home Health: none Equipment/Devices: none  Discharge Condition: fair CODE STATUS: full code Diet recommendation: regular    Discharge Diagnoses:  Principal Problem:   Acute hepatic encephalopathy  Active Problems:   Alcohol use disorder, moderate, dependence (HCC)   Hypothermia  Brief narrative/HPI 48 year old male with ongoing alcohol abuse, with frequent ED visits and hospitalization for hepatic encephalopathy found unresponsive on the floor.  He was extremely lethargic on presentation, hypothermic with elevated blood pressure and ammonia level of 115.  Had mild transaminitis, INR of 1.2 and alcohol level of 63.  Urine drug screen positive for cannabinoids.  CT head unremarkable.  Patient given lactulose enema and admitted to telemetry.   Hospital course  Principal Problem:   Acute hepatic encephalopathy Secondary to the current alcohol use.  Improving with lactulose enema and mental status now back to baseline.  Tolerating po. no signs of alcohol withdrawal.   Active Problems: Hypothermia Upon presentation.   resolved.  Stable on telemetry.  Labs reviewed and unremarkable.  COVID-19 negative    Alcohol use disorder, moderate, dependence (HCC) Plan as above.  No signs of withdrawal.  Prescribed thiamine, folate and multivitamin.    Wife extremely concerned about him going back to drinking, patient still drives and goes to work.  She is worried that patient may go into active withdrawal or seizures while she is away at work.  She wants him to receive outpatient rehab if possible.  Social work consulted and will provide with necessary outpatient resources.  Patient is going to stay  with his mother upon discharge.    Also per wife patient doesn't have a diagnosis of hep C and has been tested negative by his PCP.   Patient has been counseled extensively on alcohol cessation, significant risk to his health with his ongoing alcohol use and provided with outpatient resources.  Transaminitis Mild secondary to fatty liver with alcohol use.  Monitor as outpatient given his ongoing alcohol use and also being on statin.  Family Communication  : spoke with wife on the phone  Disposition Plan  : home  Consults  : None  Procedures  : CT head   Discharge Instructions   Allergies as of 05/14/2019   No Known Allergies     Medication List    TAKE these medications   atorvastatin 40 MG tablet Commonly known as: LIPITOR Take 1 tablet (40 mg total) by mouth daily at 6 PM.   folic acid 1 MG tablet Commonly known as: FOLVITE Take 1 tablet (1 mg total) by mouth daily.   lactulose 10 GM/15ML solution Commonly known as: CHRONULAC Take 15 mLs (10 g total) by mouth 2 (two) times daily as needed for mild constipation (And confusion).   multivitamin with minerals tablet Take 1 tablet by mouth daily.   omeprazole 20 MG capsule Commonly known as: PRILOSEC Take 20 mg by mouth daily as needed.   thiamine 100 MG tablet Take 1 tablet (100 mg total) by mouth daily.      Follow-up Information    Dortha Kern, MD Follow up in 1 week(s).   Specialty: Family Medicine Contact information: [address] [place] [address] [phone]          No  Known Allergies    Procedures/Studies: CT Head Wo Contrast  Result Date: 05/13/2019 CLINICAL DATA:  Found unresponsive on floor EXAM: CT HEAD WITHOUT CONTRAST TECHNIQUE: Contiguous axial images were obtained from the base of the skull through the vertex without intravenous contrast. COMPARISON:  03/31/2019 FINDINGS: Brain: No evidence of acute infarction, hemorrhage, hydrocephalus, extra-axial collection or  mass lesion/mass effect. Vascular: No hyperdense vessel or unexpected calcification. Skull: Normal. Negative for fracture or focal lesion. Sinuses/Orbits: No acute finding. Other: None. IMPRESSION: No acute abnormality noted. Electronically Signed   By: Alcide Clever M.D.   On: 05/13/2019 03:40   DG Chest Port 1 View  Result Date: 05/13/2019 CLINICAL DATA:  Found unresponsive EXAM: PORTABLE CHEST 1 VIEW COMPARISON:  08/30/2011 FINDINGS: Cardiac shadow is stable. The lungs are well aerated bilaterally. No focal infiltrate or sizable effusion is seen. No bony is noted. IMPRESSION: No active disease. Electronically Signed   By: Alcide Clever M.D.   On: 05/13/2019 03:21       Subjective: Not in distress.  No tremors  Discharge Exam: Vitals:   05/14/19 0524 05/14/19 1207  BP: 130/85 131/83  Pulse: 73 76  Resp: 20 16  Temp: 98.1 F (36.7 C) 98.3 F (36.8 C)  SpO2: 100% 99%   Vitals:   05/13/19 1225 05/13/19 2253 05/14/19 0524 05/14/19 1207  BP: 128/82 123/89 130/85 131/83  Pulse: 86 81 73 76  Resp: Temp: 99 F (37.2 C) 98.2 F (36.8 C) 98.1 F (36.7 C) 98.3 F (36.8 C)  TempSrc: Oral Oral Oral Oral  SpO2: 99% 99% 100% 99%  Weight:      Height:        General: No acute distress HEENT: Moist mucosa, supple neck Chest: Clear CVs: Normal S1-S2 GI: Soft, nondistended, nontender Musculoskeletal: Warm, no edema CNs: Alert and oriented, no tremors    The results of significant diagnostics from this hospitalization (including imaging, microbiology, ancillary and laboratory) are listed below for reference.     Microbiology: Recent Results (from the past 240 hour(s))  SARS CORONAVIRUS 2 (TAT 6-24 HRS) Nasopharyngeal Nasopharyngeal Swab     Status: None   Collection Time: 05/13/19  3:35 AM   Specimen: Nasopharyngeal Swab  Result Value Ref Range Status   SARS Coronavirus 2 NEGATIVE NEGATIVE Final    Comment: (NOTE) SARS-CoV-2 target nucleic acids are NOT  DETECTED. The SARS-CoV-2 RNA is generally detectable in upper and lower respiratory specimens during the acute phase of infection. Negative results do not preclude SARS-CoV-2 infection, do not rule out co-infections with other pathogens, and should not be used as the sole basis for treatment or other patient management decisions. Negative results must be combined with clinical observations, patient history, and epidemiological information. The expected result is Negative. Fact Sheet for Patients: HairSlick.no Fact Sheet for Healthcare Providers: quierodirigir.com This test is not yet approved or cleared by the [place] FDA and  has been authorized for detection and/or diagnosis of SARS-CoV-2 by FDA under an Emergency Use Authorization (EUA). This EUA will remain  in effect (meaning this test can be used) for the duration of the COVID-19 declaration under Section 56 4(b)(1) of the Act, 21 U.S.C. section 360bbb-3(b)(1), unless the authorization is terminated or revoked sooner. Performed at [facility], [address] [place], [place] [address]      Labs: BNP (last 3 results) No results for input(s): BNP in the last 8760 hours. Basic Metabolic Panel: Recent Labs  Lab  05/13/19 0301 05/14/19 0551  NA 137 138  K 3.8 3.6  CL 108 112*  CO2 17* 18*  GLUCOSE 101* 97  BUN 19 17  CREATININE 0.81 0.74  CALCIUM 9.0 8.4*   Liver Function Tests: Recent Labs  Lab 05/13/19 0301 05/14/19 0551  AST 53* 62*  ALT 47* 46*  ALKPHOS 76 72  BILITOT 0.8 1.0  PROT 7.8 6.9  ALBUMIN 4.0 3.5   Recent Labs  Lab 05/13/19 0301  LIPASE 31   Recent Labs  Lab 05/13/19 0304  AMMONIA 115*   CBC: Recent Labs  Lab 05/13/19 0301 05/14/19 0551  WBC 6.7 7.9  NEUTROABS 3.6  --   HGB 12.3* 11.9*  HCT 34.8* 34.5*  MCV 90.2 92.5  PLT 234 214   Cardiac Enzymes: Recent Labs  Lab 05/13/19 0301  CKTOTAL 246    BNP: Invalid input(s): POCBNP CBG: No results for input(s): GLUCAP in the last 168 hours. D-Dimer No results for input(s): DDIMER in the last 72 hours. Hgb A1c No results for input(s): HGBA1C in the last 72 hours. Lipid Profile No results for input(s): CHOL, HDL, LDLCALC, TRIG, CHOLHDL, LDLDIRECT in the last 72 hours. Thyroid function studies No results for input(s): TSH, T4TOTAL, T3FREE, THYROIDAB in the last 72 hours.  Invalid input(s): FREET3 Anemia work up No results for input(s): VITAMINB12, FOLATE, FERRITIN, TIBC, IRON, RETICCTPCT in the last 72 hours. Urinalysis    Component Value Date/Time   COLORURINE COLORLESS (A) 03/31/2019 2151   APPEARANCEUR CLEAR (A) 03/31/2019 2151   APPEARANCEUR Clear 10/13/2012 0351   LABSPEC 1.003 (L) 03/31/2019 2151   LABSPEC 1.003 10/13/2012 0351   PHURINE 7.0 03/31/2019 2151   GLUCOSEU NEGATIVE 03/31/2019 2151   GLUCOSEU Negative 10/13/2012 0351   HGBUR NEGATIVE 03/31/2019 2151   [place] 03/31/2019 2151   BILIRUBINUR Negative 10/13/2012 [place] 03/31/2019 2151   PROTEINUR NEGATIVE 03/31/2019 2151   NITRITE NEGATIVE 03/31/2019 2151   LEUKOCYTESUR NEGATIVE 03/31/2019 2151   LEUKOCYTESUR Negative 10/13/2012 0351   Sepsis Labs Invalid input(s): PROCALCITONIN,  WBC,  LACTICIDVEN Microbiology Recent Results (from the past 240 hour(s))  SARS CORONAVIRUS 2 (TAT 6-24 HRS) Nasopharyngeal Nasopharyngeal Swab     Status: None   Collection Time: 05/13/19  3:35 AM   Specimen: Nasopharyngeal Swab  Result Value Ref Range Status   SARS Coronavirus 2 NEGATIVE NEGATIVE Final    Comment: (NOTE) SARS-CoV-2 target nucleic acids are NOT DETECTED. The SARS-CoV-2 RNA is generally detectable in upper and lower respiratory specimens during the acute phase of infection. Negative results do not preclude SARS-CoV-2 infection, do not rule out co-infections with other pathogens, and should not be used as the sole basis for  treatment or other patient management decisions. Negative results must be combined with clinical observations, patient history, and epidemiological information. The expected result is Negative. Fact Sheet for Patients: SugarRoll.be Fact Sheet for Healthcare Providers: https://www.woods-mathews.com/ This test is not yet approved or cleared by the [place] FDA and  has been authorized for detection and/or diagnosis of SARS-CoV-2 by FDA under an Emergency Use Authorization (EUA). This EUA will remain  in effect (meaning this test can be used) for the duration of the COVID-19 declaration under Section 56 4(b)(1) of the Act, 21 U.S.C. section 360bbb-3(b)(1), unless the authorization is terminated or revoked sooner. Performed at [facility], [place] [address], [place], [place] [address]      Time coordinating discharge: 35 minutes  SIGNED:   Sherlyne Crownover,  MD  [facility] 05/14/2019, 3:33 PM Pager   If 7PM-7AM, please contact night-coverage [email or web address] Password [email or web address]

## 2019-05-30 ENCOUNTER — Emergency Department: Payer: PRIVATE HEALTH INSURANCE

## 2019-05-30 ENCOUNTER — Inpatient Hospital Stay
Admission: EM | Admit: 2019-05-30 | Discharge: 2019-06-03 | DRG: 441 | Disposition: A | Discharge status: Home / Self Care | Payer: PRIVATE HEALTH INSURANCE | Attending: Internal Medicine | Admitting: Internal Medicine

## 2019-05-30 ENCOUNTER — Other Ambulatory Visit: Payer: Self-pay

## 2019-05-30 DIAGNOSIS — K7030 Alcoholic cirrhosis of liver without ascites: Secondary | ICD-10-CM | POA: Diagnosis present

## 2019-05-30 DIAGNOSIS — F1290 Cannabis use, unspecified, uncomplicated: Secondary | ICD-10-CM | POA: Diagnosis present

## 2019-05-30 DIAGNOSIS — K7290 Hepatic failure, unspecified without coma: Secondary | ICD-10-CM | POA: Diagnosis present

## 2019-05-30 DIAGNOSIS — G9341 Metabolic encephalopathy: Secondary | ICD-10-CM | POA: Diagnosis present

## 2019-05-30 DIAGNOSIS — Z20822 Contact with and (suspected) exposure to covid-19: Secondary | ICD-10-CM | POA: Diagnosis present

## 2019-05-30 DIAGNOSIS — J9601 Acute respiratory failure with hypoxia: Secondary | ICD-10-CM | POA: Diagnosis present

## 2019-05-30 DIAGNOSIS — R569 Unspecified convulsions: Secondary | ICD-10-CM | POA: Diagnosis present

## 2019-05-30 DIAGNOSIS — F17290 Nicotine dependence, other tobacco product, uncomplicated: Secondary | ICD-10-CM | POA: Diagnosis present

## 2019-05-30 DIAGNOSIS — K7682 Hepatic encephalopathy: Secondary | ICD-10-CM

## 2019-05-30 DIAGNOSIS — F329 Major depressive disorder, single episode, unspecified: Secondary | ICD-10-CM | POA: Diagnosis not present

## 2019-05-30 DIAGNOSIS — F10929 Alcohol use, unspecified with intoxication, unspecified: Secondary | ICD-10-CM

## 2019-05-30 DIAGNOSIS — Z79899 Other long term (current) drug therapy: Secondary | ICD-10-CM

## 2019-05-30 DIAGNOSIS — E876 Hypokalemia: Secondary | ICD-10-CM | POA: Diagnosis present

## 2019-05-30 DIAGNOSIS — F17210 Nicotine dependence, cigarettes, uncomplicated: Secondary | ICD-10-CM | POA: Diagnosis present

## 2019-05-30 DIAGNOSIS — B1920 Unspecified viral hepatitis C without hepatic coma: Secondary | ICD-10-CM | POA: Diagnosis present

## 2019-05-30 DIAGNOSIS — F10229 Alcohol dependence with intoxication, unspecified: Secondary | ICD-10-CM | POA: Diagnosis present

## 2019-05-30 DIAGNOSIS — E7220 Disorder of urea cycle metabolism, unspecified: Secondary | ICD-10-CM | POA: Diagnosis present

## 2019-05-30 DIAGNOSIS — M25531 Pain in right wrist: Secondary | ICD-10-CM | POA: Diagnosis present

## 2019-05-30 LAB — BLOOD GAS, ARTERIAL
Acid-base deficit: 4.1 mmol/L — ABNORMAL HIGH (ref 0.0–2.0)
Bicarbonate: 21.6 mmol/L (ref 20.0–28.0)
FIO2: 0.4
MECHVT: 500 mL
O2 Saturation: 99.7 %
PEEP: 5 cmH2O
Patient temperature: 37
RATE: 14 resp/min
pCO2 arterial: 41 mmHg (ref 32.0–48.0)
pH, Arterial: 7.33 — ABNORMAL LOW (ref 7.350–7.450)
pO2, Arterial: 205 mmHg — ABNORMAL HIGH (ref 83.0–108.0)

## 2019-05-30 LAB — CBC WITH DIFFERENTIAL/PLATELET
Abs Immature Granulocytes: 0.03 10*3/uL (ref 0.00–0.07)
Basophils Absolute: 0.1 10*3/uL (ref 0.0–0.1)
Basophils Relative: 1 %
Eosinophils Absolute: 0.1 10*3/uL (ref 0.0–0.5)
Eosinophils Relative: 1 %
HCT: 40.6 % (ref 39.0–52.0)
Hemoglobin: 14 g/dL (ref 13.0–17.0)
Immature Granulocytes: 0 %
Lymphocytes Relative: 32 %
Lymphs Abs: 2.4 10*3/uL (ref 0.7–4.0)
MCH: 31.4 pg (ref 26.0–34.0)
MCHC: 34.5 g/dL (ref 30.0–36.0)
MCV: 91 fL (ref 80.0–100.0)
Monocytes Absolute: 0.5 10*3/uL (ref 0.1–1.0)
Monocytes Relative: 7 %
Neutro Abs: 4.5 10*3/uL (ref 1.7–7.7)
Neutrophils Relative %: 59 %
Platelets: 239 10*3/uL (ref 150–400)
RBC: 4.46 MIL/uL (ref 4.22–5.81)
RDW: 13.9 % (ref 11.5–15.5)
WBC: 7.6 10*3/uL (ref 4.0–10.5)
nRBC: 0 % (ref 0.0–0.2)

## 2019-05-30 LAB — COMPREHENSIVE METABOLIC PANEL
ALT: 80 U/L — ABNORMAL HIGH (ref 0–44)
AST: 62 U/L — ABNORMAL HIGH (ref 15–41)
Albumin: 4.6 g/dL (ref 3.5–5.0)
Alkaline Phosphatase: 88 U/L (ref 38–126)
Anion gap: 15 (ref 5–15)
BUN: 17 mg/dL (ref 6–20)
CO2: 20 mmol/L — ABNORMAL LOW (ref 22–32)
Calcium: 9.1 mg/dL (ref 8.9–10.3)
Chloride: 107 mmol/L (ref 98–111)
Creatinine, Ser: 0.73 mg/dL (ref 0.61–1.24)
GFR calc Af Amer: >60 mL/min (ref >60)
GFR calc non Af Amer: >60 mL/min (ref >60)
Glucose, Bld: 107 mg/dL — ABNORMAL HIGH (ref 70–99)
Potassium: 4 mmol/L (ref 3.5–5.1)
Sodium: 142 mmol/L (ref 135–145)
Total Bilirubin: 0.7 mg/dL (ref 0.3–1.2)
Total Protein: 8.4 g/dL — ABNORMAL HIGH (ref 6.5–8.1)

## 2019-05-30 LAB — URINE DRUG SCREEN, QUALITATIVE (ARMC ONLY)
Amphetamines, Ur Screen: NOT DETECTED
Barbiturates, Ur Screen: NOT DETECTED
Benzodiazepine, Ur Scrn: NOT DETECTED
Cannabinoid 50 Ng, Ur ~~LOC~~: POSITIVE — AB
Cocaine Metabolite,Ur ~~LOC~~: POSITIVE — AB
MDMA (Ecstasy)Ur Screen: NOT DETECTED
Methadone Scn, Ur: NOT DETECTED
Opiate, Ur Screen: NOT DETECTED
Phencyclidine (PCP) Ur S: NOT DETECTED
Tricyclic, Ur Screen: NOT DETECTED

## 2019-05-30 LAB — PROTIME-INR
INR: 1 (ref 0.8–1.2)
Prothrombin Time: 13.3 seconds (ref 11.4–15.2)

## 2019-05-30 LAB — POC SARS CORONAVIRUS 2 AG: SARS Coronavirus 2 Ag: NEGATIVE

## 2019-05-30 LAB — RESPIRATORY PANEL BY RT PCR (FLU A&B, COVID)
Influenza A by PCR: NEGATIVE
Influenza B by PCR: NEGATIVE
SARS Coronavirus 2 by RT PCR: NEGATIVE

## 2019-05-30 LAB — ETHANOL: Alcohol, Ethyl (B): 181 mg/dL — ABNORMAL HIGH (ref <10)

## 2019-05-30 LAB — AMMONIA: Ammonia: 110 umol/L — ABNORMAL HIGH (ref 9–35)

## 2019-05-30 LAB — SALICYLATE LEVEL: Salicylate Lvl: <7 mg/dL — ABNORMAL LOW (ref 7.0–30.0)

## 2019-05-30 MED ORDER — THIAMINE HCL 100 MG/ML IJ SOLN: 100.0000 mg | Freq: Every day | INTRAMUSCULAR | Status: DC

## 2019-05-30 MED ORDER — FENTANYL BOLUS VIA INFUSION
50.0000 ug | INTRAVENOUS | Status: DC | PRN
  Administered 2019-05-31: 50 ug via INTRAVENOUS
  Filled 2019-05-30: qty 50

## 2019-05-30 MED ORDER — ACETAMINOPHEN 325 MG PO TABS
650.0000 mg | ORAL_TABLET | ORAL | Status: DC | PRN
  Administered 2019-06-02: 21:00:00 650 mg via ORAL
  Filled 2019-05-30: qty 2

## 2019-05-30 MED ORDER — MIDAZOLAM HCL 2 MG/2ML IJ SOLN
4.0000 mg | Freq: Once | INTRAMUSCULAR | Status: AC
  Administered 2019-05-30: 4 mg via INTRAVENOUS

## 2019-05-30 MED ORDER — ROCURONIUM BROMIDE 50 MG/5ML IV SOLN
100.0000 mg | Freq: Once | INTRAVENOUS | Status: AC
  Administered 2019-05-30: 100 mg via INTRAVENOUS

## 2019-05-30 MED ORDER — SUCCINYLCHOLINE CHLORIDE 20 MG/ML IJ SOLN
100.0000 mg | Freq: Once | INTRAMUSCULAR | Status: AC
  Administered 2019-05-30: 100 mg via INTRAVENOUS

## 2019-05-30 MED ORDER — FENTANYL 2500MCG IN NS 250ML (10MCG/ML) PREMIX INFUSION: 40.0000 ug/h | INTRAVENOUS | Status: DC

## 2019-05-30 MED ORDER — THIAMINE HCL 100 MG PO TABS
100.0000 mg | ORAL_TABLET | Freq: Every day | ORAL | Status: DC
  Administered 2019-06-01 – 2019-06-03 (×3): 100 mg via ORAL
  Filled 2019-05-30 (×3): qty 1

## 2019-05-30 MED ORDER — LACTATED RINGERS IV SOLN: INTRAVENOUS | Status: DC

## 2019-05-30 MED ORDER — SODIUM CHLORIDE 0.9 % IV BOLUS
500.0000 mL | Freq: Once | INTRAVENOUS | Status: AC
  Administered 2019-05-30: 500 mL via INTRAVENOUS

## 2019-05-30 MED ORDER — FENTANYL 2500MCG IN NS 250ML (10MCG/ML) PREMIX INFUSION
INTRAVENOUS | Status: AC
  Administered 2019-05-30: 50 ug/h via INTRAVENOUS
  Filled 2019-05-30: qty 250

## 2019-05-30 MED ORDER — LACTULOSE 10 GM/15ML PO SOLN
30.0000 g | Freq: Once | ORAL | Status: AC
  Administered 2019-05-30: 30 g
  Filled 2019-05-30: qty 60

## 2019-05-30 MED ORDER — IPRATROPIUM-ALBUTEROL 0.5-2.5 (3) MG/3ML IN SOLN: 3.0000 mL | RESPIRATORY_TRACT | Status: DC | PRN

## 2019-05-30 MED ORDER — FENTANYL CITRATE (PF) 100 MCG/2ML IJ SOLN
100.0000 ug | Freq: Once | INTRAMUSCULAR | Status: AC
  Administered 2019-05-30: 100 ug via INTRAVENOUS

## 2019-05-30 MED ORDER — PANTOPRAZOLE SODIUM 40 MG IV SOLR
40.0000 mg | Freq: Every day | INTRAVENOUS | Status: DC
  Administered 2019-05-31 – 2019-06-02 (×3): 40 mg via INTRAVENOUS
  Filled 2019-05-30 (×3): qty 40

## 2019-05-30 MED ORDER — ONDANSETRON HCL 4 MG/2ML IJ SOLN: 4.0000 mg | Freq: Four times a day (QID) | INTRAMUSCULAR | Status: DC | PRN

## 2019-05-30 MED ORDER — MIDAZOLAM HCL 2 MG/2ML IJ SOLN: 2.0000 mg | INTRAMUSCULAR | Status: DC | PRN

## 2019-05-30 MED ORDER — PROPOFOL 1000 MG/100ML IV EMUL
INTRAVENOUS | Status: AC
  Administered 2019-05-30: 75 ug/kg/min via INTRAVENOUS
  Filled 2019-05-30: qty 100

## 2019-05-30 MED ORDER — FENTANYL CITRATE (PF) 100 MCG/2ML IJ SOLN: 50.0000 ug | Freq: Once | INTRAMUSCULAR | Status: DC

## 2019-05-30 MED ORDER — FOLIC ACID 1 MG PO TABS
1.0000 mg | ORAL_TABLET | Freq: Every day | ORAL | Status: DC
  Administered 2019-06-01 – 2019-06-03 (×3): 1 mg via ORAL
  Filled 2019-05-30 (×3): qty 1

## 2019-05-30 MED ORDER — PROPOFOL 1000 MG/100ML IV EMUL
5.0000 ug/kg/min | INTRAVENOUS | Status: DC
  Administered 2019-05-31: 80 ug/kg/min via INTRAVENOUS
  Administered 2019-05-31: 30 ug/kg/min via INTRAVENOUS
  Filled 2019-05-30 (×3): qty 100

## 2019-05-30 MED ORDER — ADULT MULTIVITAMIN W/MINERALS CH
1.0000 | ORAL_TABLET | Freq: Every day | ORAL | Status: DC
  Administered 2019-06-01 – 2019-06-03 (×3): 1 via ORAL
  Filled 2019-05-30 (×3): qty 1

## 2019-05-30 MED ORDER — ETOMIDATE 2 MG/ML IV SOLN
20.0000 mg | Freq: Once | INTRAVENOUS | Status: AC
  Administered 2019-05-30: 20 mg via INTRAVENOUS

## 2019-05-30 MED ORDER — RIFAXIMIN 200 MG PO TABS
200.0000 mg | ORAL_TABLET | Freq: Two times a day (BID) | ORAL | Status: DC
  Administered 2019-05-31 – 2019-06-03 (×6): 200 mg via ORAL
  Filled 2019-05-30 (×9): qty 1

## 2019-05-30 MED ORDER — MIDAZOLAM HCL 2 MG/2ML IJ SOLN
2.0000 mg | INTRAMUSCULAR | Status: DC | PRN
  Administered 2019-05-31: 2 mg via INTRAVENOUS
  Filled 2019-05-30: qty 2

## 2019-05-30 MED ORDER — HEPARIN SODIUM (PORCINE) 5000 UNIT/ML IJ SOLN
5000.0000 [IU] | Freq: Three times a day (TID) | INTRAMUSCULAR | Status: DC
  Administered 2019-05-31 – 2019-06-03 (×9): 5000 [IU] via SUBCUTANEOUS
  Filled 2019-05-30 (×9): qty 1

## 2019-05-30 NOTE — ED Notes (Signed)
[place], rn and [place], rn at bedside

## 2019-05-30 NOTE — ED Notes (Signed)
Fall alarm set up

## 2019-05-30 NOTE — ED Notes (Addendum)
50mg  bolus propofol at this time pushed by Dr. Quentin Cornwall

## 2019-05-30 NOTE — ED Notes (Signed)
Pt given lactulose through OG tube. Mixed with sterile water for dilution per pharmacy. Flushed with 30ML sterile water after med.

## 2019-05-30 NOTE — ED Notes (Signed)
Propofol drip changed to 100mg /hour per Dr. Quentin Cornwall

## 2019-05-30 NOTE — ED Notes (Signed)
Intubated with positive color change 23cm at the teeth

## 2019-05-30 NOTE — ED Notes (Signed)
20mg  etomidate in now

## 2019-05-30 NOTE — ED Notes (Signed)
114mcg bolus fentenyl given at this time

## 2019-05-30 NOTE — ED Notes (Signed)
Patient transported to CT 

## 2019-05-30 NOTE — ED Provider Notes (Signed)
[facility] Emergency Department Provider Note    First MD Initiated Contact with Patient 05/30/19 2045     (approximate)  I have reviewed the triage vital signs and the nursing notes.   HISTORY  Chief Complaint Seizures and Alcohol Intoxication  Level V Caveat:  AMS - decreased loc  HPI Joel P Karleen HampshireSpencer Sr. is a 48 y.o. male presents to the ER for seizure-like activity.  Patient with extensive history of substance abuse alcohol dependence as well as hepatic encephalopathy presents the ER with seizure-like activity.  These episodes apparently only happen the patient has been drinking.  He is not on any antiepileptic medications.  Was admitted this month for similar symptoms and found to be in hepatic encephalopathy   Past Medical History:  Diagnosis Date  . Chronic alcohol abuse   . Elevated transaminase level   . Hepatitis C   . Seizure (HCC)   . Tobacco use    History reviewed. No pertinent family history. Past Surgical History:  Procedure Laterality Date  . CARPAL TUNNEL RELEASE     Patient Active Problem List   Diagnosis Date Noted  . Seizures (HCC) 05/30/2019  . Hypothermia 05/13/2019  . Substance abuse (HCC) 04/01/2019  . Coagulopathy (HCC) 04/01/2019  . Alcohol use disorder, moderate, dependence (HCC) 04/01/2019  . Irritability 04/01/2019  . Acute hepatic encephalopathy 07/22/2015      Prior to Admission medications   Medication Sig Start Date End Date Taking? Authorizing Provider  atorvastatin (LIPITOR) 40 MG tablet Take 1 tablet (40 mg total) by mouth daily at 6 PM. 04/02/19 07/01/19 Yes Amin, Ankit Chirag, MD  B Complex-Biotin-FA (B-100 B-COMPLEX PO) Take 1 tablet by mouth daily.   Yes [provider]  disulfiram (ANTABUSE) 250 MG tablet Take 250 mg by mouth daily. 05/28/19  Yes [provider]  folic acid (FOLVITE) 1 MG tablet Take 1 tablet (1 mg total) by mouth daily. 05/14/19  Yes Dhungel, Nishant, MD  lactulose  (CHRONULAC) 10 GM/15ML solution Take 15 mLs (10 g total) by mouth 2 (two) times daily as needed for mild constipation (And confusion). 04/02/19  Yes Amin, Loura HaltAnkit Chirag, MD  tadalafil (CIALIS) 20 MG tablet Take 20 mg by mouth daily as needed for erectile dysfunction.   Yes [provider]  venlafaxine XR (EFFEXOR-XR) 150 MG 24 hr capsule Take 150 mg by mouth daily. 05/11/19  Yes [provider]  Multiple Vitamins-Minerals (MULTIVITAMIN WITH MINERALS) tablet Take 1 tablet by mouth daily. Patient not taking: Reported on 05/30/2019 05/14/19 05/13/20  Dhungel, Theda BelfastNishant, MD  omeprazole (PRILOSEC) 20 MG capsule Take 20 mg by mouth daily as needed.    [provider]  thiamine 100 MG tablet Take 1 tablet (100 mg total) by mouth daily. Patient not taking: Reported on 05/30/2019 05/14/19   Eddie Northhungel, Nishant, MD    Allergies Patient has no known allergies.    Social History Social History   Tobacco Use  . Smoking status: Current Every Day Smoker    Packs/day: 6.00    Types: Cigars  . Smokeless tobacco: Never Used  Substance Use Topics  . Alcohol use: Yes    Alcohol/week: 6.0 standard drinks    Types: 6 Cans of beer per week    Comment: 18  . Drug use: Yes    Types: Marijuana    Review of Systems Patient denies headaches, rhinorrhea, blurry vision, numbness, shortness of breath, chest pain, edema, cough, abdominal pain, nausea, vomiting, diarrhea, dysuria, fevers, rashes  or hallucinations unless otherwise stated above in HPI. ____________________________________________   PHYSICAL EXAM:  VITAL SIGNS: Vitals:   05/30/19 2300 05/30/19 2315  BP: 104/70 103/68  Pulse: 81 78  Resp: (!) 22 (!) 22  Temp: (!) 96.2 F (35.7 C) (!) 95.9 F (35.5 C)  SpO2: 99% 99%    Constitutional: encephalopathic, intermittent twitching motions of all extremities.    Eyes: Conjunctivae are normal. Pupils equal Head: Atraumatic. Nose: No congestion/rhinnorhea. Mouth/Throat:  Mucous membranes are moist.   Neck: No stridor. Painless ROM.  Cardiovascular: Normal rate, regular rhythm. Grossly normal heart sounds.  Good peripheral circulation. Respiratory: Normal respiratory effort.  No retractions. Lungs with coarse bibasilar respirations Gastrointestinal: Soft and nontender. No distention. No abdominal bruits. No CVA tenderness. Genitourinary:  Musculoskeletal: No lower extremity tenderness nor edema.  No joint effusions. Neurologic:  GCS 8. Encephalopathic, flexes to pain  Skin:  Skin is warm, dry and intact. No rash noted. Psychiatric: unable to assess  ____________________________________________   LABS (all labs ordered are listed, but only abnormal results are displayed)  Results for orders placed or performed during the hospital encounter of 05/30/19 (from the past 24 hour(s))  CBC with Differential     Status: None   Collection Time: 05/30/19  8:39 PM  Result Value Ref Range   WBC 7.6 4.0 - 10.5 K/uL   RBC 4.46 4.22 - 5.81 MIL/uL   Hemoglobin 14.0 13.0 - 17.0 g/dL   HCT 85.8 85.0 - 27.7 %   MCV 91.0 80.0 - 100.0 fL   MCH 31.4 26.0 - 34.0 pg   MCHC 34.5 30.0 - 36.0 g/dL   RDW 41.2 87.8 - 67.6 %   Platelets 239 150 - 400 K/uL   nRBC 0.0 0.0 - 0.2 %   Neutrophils Relative % 59 %   Neutro Abs 4.5 1.7 - 7.7 K/uL   Lymphocytes Relative 32 %   Lymphs Abs 2.4 0.7 - 4.0 K/uL   Monocytes Relative 7 %   Monocytes Absolute 0.5 0.1 - 1.0 K/uL   Eosinophils Relative 1 %   Eosinophils Absolute 0.1 0.0 - 0.5 K/uL   Basophils Relative 1 %   Basophils Absolute 0.1 0.0 - 0.1 K/uL   Immature Granulocytes 0 %   Abs Immature Granulocytes 0.03 0.00 - 0.07 K/uL  Comprehensive metabolic panel     Status: Abnormal   Collection Time: 05/30/19  8:39 PM  Result Value Ref Range   Sodium 142 135 - 145 mmol/L   Potassium 4.0 3.5 - 5.1 mmol/L   Chloride 107 98 - 111 mmol/L   CO2 20 (L) 22 - 32 mmol/L   Glucose, Bld 107 (H) 70 - 99 mg/dL   BUN 17 6 - 20 mg/dL    Creatinine, Ser 7.20 0.61 - 1.24 mg/dL   Calcium 9.1 8.9 - 94.7 mg/dL   Total Protein 8.4 (H) 6.5 - 8.1 g/dL   Albumin 4.6 3.5 - 5.0 g/dL   AST 62 (H) 15 - 41 U/L   ALT 80 (H) 0 - 44 U/L   Alkaline Phosphatase 88 38 - 126 U/L   Total Bilirubin 0.7 0.3 - 1.2 mg/dL   GFR calc non Af Amer >60 >60 mL/min   GFR calc Af Amer >60 >60 mL/min   Anion gap 15 5 - 15  Protime-INR     Status: None   Collection Time: 05/30/19  8:39 PM  Result Value Ref Range   Prothrombin Time 13.3 11.4 - 15.2 seconds  INR 1.0 0.8 - 1.2  Ethanol     Status: Abnormal   Collection Time: 05/30/19  8:39 PM  Result Value Ref Range   Alcohol, Ethyl (B) 181 (H) <10 mg/dL  Salicylate level     Status: Abnormal   Collection Time: 05/30/19  8:39 PM  Result Value Ref Range   Salicylate Lvl <7.0 (L) 7.0 - 30.0 mg/dL  Ammonia     Status: Abnormal   Collection Time: 05/30/19  8:47 PM  Result Value Ref Range   Ammonia 110 (H) 9 - 35 umol/L  POC SARS Coronavirus 2 Ag     Status: None   Collection Time: 05/30/19 10:05 PM  Result Value Ref Range   SARS Coronavirus 2 Ag NEGATIVE NEGATIVE  Urine Drug Screen, Qualitative ([facility] only)     Status: Abnormal   Collection Time: 05/30/19 10:18 PM  Result Value Ref Range   Tricyclic, Ur Screen NONE DETECTED NONE DETECTED   Amphetamines, Ur Screen NONE DETECTED NONE DETECTED   MDMA (Ecstasy)Ur Screen NONE DETECTED NONE DETECTED   Cocaine Metabolite,Ur [place] POSITIVE (A) NONE DETECTED   Opiate, Ur Screen NONE DETECTED NONE DETECTED   Phencyclidine (PCP) Ur S NONE DETECTED NONE DETECTED   Cannabinoid 50 Ng, Ur [place] POSITIVE (A) NONE DETECTED   Barbiturates, Ur Screen NONE DETECTED NONE DETECTED   Benzodiazepine, Ur Scrn NONE DETECTED NONE DETECTED   Methadone Scn, Ur NONE DETECTED NONE DETECTED  Blood gas, arterial     Status: Abnormal   Collection Time: 05/30/19 11:18 PM  Result Value Ref Range   FIO2 0.40    Delivery systems VENTILATOR    Mode ASSIST CONTROL    VT 500 mL   LHR  14 resp/min   Peep/cpap 5.0 cm H20   pH, Arterial 7.33 (L) 7.350 - 7.450   pCO2 arterial 41 32.0 - 48.0 mmHg   pO2, Arterial 205 (H) 83.0 - 108.0 mmHg   Bicarbonate 21.6 20.0 - 28.0 mmol/L   Acid-base deficit 4.1 (H) 0.0 - 2.0 mmol/L   O2 Saturation 99.7 %   Patient temperature 37.0    Collection site RIGHT RADIAL    Sample type ARTERIAL DRAW    Allens test (pass/fail) PASS PASS   ____________________________________________  EKG My review and personal interpretation at Time: 20:41   Indication: ams  Rate: 115  Rhythm: sinus Axis: normal Other: no stemi, normal intervals ____________________________________________  RADIOLOGY  I personally reviewed all radiographic images ordered to evaluate for the above acute complaints and reviewed radiology reports and findings.  These findings were personally discussed with the patient.  Please see medical record for radiology report.  ____________________________________________   PROCEDURES  Procedure(s) performed:  .Critical Care Performed by: Willy Eddy, MD Authorized by: Willy Eddy, MD   Critical care provider statement:    Critical care time (minutes):  45   Critical care time was exclusive of:  Separately billable procedures and treating other patients   Critical care was necessary to treat or prevent imminent or life-threatening deterioration of the following conditions:  CNS failure or compromise   Critical care was time spent personally by me on the following activities:  Development of treatment plan with patient or surrogate, discussions with consultants, evaluation of patient's response to treatment, examination of patient, obtaining history from patient or surrogate, ordering and performing treatments and interventions, ordering and review of laboratory studies, ordering and review of radiographic studies, pulse oximetry, re-evaluation of patient's condition and review of old charts Procedure Name:  Intubation Date/Time: 05/30/2019 11:39 PM Performed by: Merlyn Lot, MD Pre-anesthesia Checklist: Patient identified, Patient being monitored, Emergency Drugs available, Timeout performed and Suction available Oxygen Delivery Method: Non-rebreather mask Preoxygenation: Pre-oxygenation with 100% oxygen Induction Type: Rapid sequence Ventilation: Mask ventilation without difficulty Laryngoscope Size: Glidescope and 3 Grade View: Grade I Tube size: 7.5 mm Number of attempts: 1 Airway Equipment and Method: Video-laryngoscopy Placement Confirmation: ETT inserted through vocal cords under direct vision,  CO2 detector and Breath sounds checked- equal and bilateral Secured at: 23 cm Tube secured with: ETT holder         Critical Care performed: yes ____________________________________________   INITIAL IMPRESSION / ASSESSMENT AND PLAN / ED COURSE  Pertinent labs & imaging results that were available during my care of the patient were reviewed by me and considered in my medical decision making (see chart for details).   DDX: Dehydration, sepsis, pna, uti, hypoglycemia, cva, drug effect, withdrawal, encephalitis    Joel TORRE Sr. is a 48 y.o. who presents to the ED with symptoms as described above.  Patient arrives altered but initially protecting his airway.  No hypoxia.  Does apparently continue to drink alcohol despite his liver disease.  Was recently admitted for hepatic encephalopathy.  Have a lower suspicion for seizure this appears more metabolic.  As he is unable to provide much history will order blood work as well as imaging and closely monitor.  Clinical Course as of May 29 2341  Wed May 30, 2019  2134 Patient becoming increasingly somnolent.  GTS declining from day 10 now to 6 (1,2,3).  Concern he can no longer protect airway in the setting of etoh abuse and probable hepatic encephalopathy.  Have a lower suspicion for seizure.  Will intubate.   [PR]  2235  Intubated without complication.  Discussed case with ICU attending.  Patient will be admitted to ICU for for medical management.   [PR]    Clinical Course User Index [PR] Merlyn Lot, MD    The patient was evaluated in Emergency Department today for the symptoms described in the history of present illness. He/she was evaluated in the context of the global COVID-19 pandemic, which necessitated consideration that the patient might be at risk for infection with the SARS-CoV-2 virus that causes COVID-19. Institutional protocols and algorithms that pertain to the evaluation of patients at risk for COVID-19 are in a state of rapid change based on information released by regulatory bodies including the CDC and federal and state organizations. These policies and algorithms were followed during the patient's care in the ED.  As part of my medical decision making, I reviewed the following data within the Uncertain notes reviewed and incorporated, Labs reviewed, notes from prior ED visits and  Controlled Substance Database   ____________________________________________   FINAL CLINICAL IMPRESSION(S) / ED DIAGNOSES  Final diagnoses:  Hepatic encephalopathy ([place])  Alcoholic intoxication with complication ([place])      NEW MEDICATIONS STARTED DURING THIS VISIT:  New Prescriptions   No medications on file     Note:  This document was prepared using Dragon voice recognition software and may include unintentional dictation errors.    Merlyn Lot, MD 05/30/19 [phone]

## 2019-05-30 NOTE — ED Notes (Signed)
50mg  propofol bolus given by Dr. Quentin Cornwall

## 2019-05-30 NOTE — ED Notes (Signed)
121mcg fentanyl bolused now

## 2019-05-30 NOTE — ED Notes (Signed)
MD, respiratory, Anda Kraft, RN at bedside to intubate.

## 2019-05-30 NOTE — ED Notes (Signed)
ED TO INPATIENT HANDOFF REPORT  ED Nurse Name and Phone #: Geraldine ContrasDee [phone]  S Name/Age/Gender Joel BlancoJohn P Bellerose Sr. 48 y.o. male Room/Bed: ED11A/ED11A  Code Status   Code Status: Prior  Home/SNF/Other Home   Triage Complete: Triage complete  Chief Complaint Seizure  Triage Note Pt comes EMS after having seizure while drinking. Pt responding to pain. Pt wife says he has seizures every time he drinks. VSS. GCS 10.     Allergies No Known Allergies  Level of Care/Admitting Diagnosis ED Disposition    ED Disposition Condition Comment   Admit  The patient appears reasonably stabilized for admission considering the current resources, flow, and capabilities available in the ED at this time, and I doubt any other [facility] requiring further screening and/or treatment in the ED prior to admission is  present.       B Medical/Surgery History Past Medical History:  Diagnosis Date  . Chronic alcohol abuse   . Elevated transaminase level   . Hepatitis C   . Seizure (HCC)   . Tobacco use    Past Surgical History:  Procedure Laterality Date  . CARPAL TUNNEL RELEASE       A IV Location/Drains/Wounds Patient Lines/Drains/Airways Status   Active Line/Drains/Airways    Name:   Placement date:   Placement time:   Site:   Days:   Peripheral IV 05/30/19 Left Forearm   05/30/19    --    Forearm   less than 1   Peripheral IV 05/30/19 Right Forearm   05/30/19    2038    Forearm   less than 1   Urethral Catheter kate, rn Double-lumen 14 Fr.   05/30/19    2217    Double-lumen   less than 1   Airway 7.5 mm   05/30/19    2153     less than 1          Intake/Output Last 24 hours  Intake/Output Summary (Last 24 hours) at 05/30/2019 2305 Last data filed at 05/30/2019 2132 Gross per 24 hour  Intake 500 ml  Output --  Net 500 ml    Labs/Imaging Results for orders placed or performed during the hospital encounter of 05/30/19 (from the past 48 hour(s))  CBC with Differential     Status: None    Collection Time: 05/30/19  8:39 PM  Result Value Ref Range   WBC 7.6 4.0 - 10.5 K/uL   RBC 4.46 4.22 - 5.81 MIL/uL   Hemoglobin 14.0 13.0 - 17.0 g/dL   HCT 16.140.6 09.639.0 - 04.552.0 %   MCV 91.0 80.0 - 100.0 fL   MCH 31.4 26.0 - 34.0 pg   MCHC 34.5 30.0 - 36.0 g/dL   RDW 40.913.9 81.111.5 - 91.415.5 %   Platelets 239 150 - 400 K/uL   nRBC 0.0 0.0 - 0.2 %   Neutrophils Relative % 59 %   Neutro Abs 4.5 1.7 - 7.7 K/uL   Lymphocytes Relative 32 %   Lymphs Abs 2.4 0.7 - 4.0 K/uL   Monocytes Relative 7 %   Monocytes Absolute 0.5 0.1 - 1.0 K/uL   Eosinophils Relative 1 %   Eosinophils Absolute 0.1 0.0 - 0.5 K/uL   Basophils Relative 1 %   Basophils Absolute 0.1 0.0 - 0.1 K/uL   Immature Granulocytes 0 %   Abs Immature Granulocytes 0.03 0.00 - 0.07 K/uL    Comment: Performed at [facility], [address] [place], [place] [address]  Comprehensive  metabolic panel     Status: Abnormal   Collection Time: 05/30/19  8:39 PM  Result Value Ref Range   Sodium 142 135 - 145 mmol/L   Potassium 4.0 3.5 - 5.1 mmol/L   Chloride 107 98 - 111 mmol/L   CO2 20 (L) 22 - 32 mmol/L   Glucose, Bld 107 (H) 70 - 99 mg/dL   BUN 17 6 - 20 mg/dL   Creatinine, Ser 1.85 0.61 - 1.24 mg/dL   Calcium 9.1 8.9 - 63.1 mg/dL   Total Protein 8.4 (H) 6.5 - 8.1 g/dL   Albumin 4.6 3.5 - 5.0 g/dL   AST 62 (H) 15 - 41 U/L   ALT 80 (H) 0 - 44 U/L   Alkaline Phosphatase 88 38 - 126 U/L   Total Bilirubin 0.7 0.3 - 1.2 mg/dL   GFR calc non Af Amer >60 >60 mL/min   GFR calc Af Amer >60 >60 mL/min   Anion gap 15 5 - 15    Comment: Performed at [facility], [address] [place], [place] [address]  Protime-INR     Status: None   Collection Time: 05/30/19  8:39 PM  Result Value Ref Range   Prothrombin Time 13.3 11.4 - 15.2 seconds   INR 1.0 0.8 - 1.2    Comment: (NOTE) INR goal varies based on device and disease states. Performed at [facility], [address] [place], [place] [address]   Ethanol      Status: Abnormal   Collection Time: 05/30/19  8:39 PM  Result Value Ref Range   Alcohol, Ethyl (B) 181 (H) <10 mg/dL    Comment: (NOTE) Lowest detectable limit for serum alcohol is 10 mg/dL. For medical purposes only. Performed at [facility], [address] [place], [place] [address]   Salicylate level     Status: Abnormal   Collection Time: 05/30/19  8:39 PM  Result Value Ref Range   Salicylate Lvl <7.0 (L) 7.0 - 30.0 mg/dL    Comment: Performed at [facility], [address] [place], [place] [address]  Ammonia     Status: Abnormal   Collection Time: 05/30/19  8:47 PM  Result Value Ref Range   Ammonia 110 (H) 9 - 35 umol/L    Comment: Performed at [facility], [address] [place], [place] [address]  POC SARS Coronavirus 2 Ag     Status: None   Collection Time: 05/30/19 10:05 PM  Result Value Ref Range   SARS Coronavirus 2 Ag NEGATIVE NEGATIVE    Comment: (NOTE) SARS-CoV-2 antigen NOT DETECTED.  Negative results are presumptive.  Negative results do not preclude SARS-CoV-2 infection and should not be used as the sole basis for treatment or other patient management decisions, including infection  control decisions, particularly in the presence of clinical signs and  symptoms consistent with COVID-19, or in those who have been in contact with the virus.  Negative results must be combined with clinical observations, patient history, and epidemiological information. The expected result is Negative. Fact Sheet for Patients: https://sanders-williams.net/ Fact Sheet for Healthcare Providers: https://martinez.com/ This test is not yet approved or cleared by the [place] FDA and  has been authorized for detection and/or diagnosis of SARS-CoV-2 by FDA under an Emergency Use Authorization (EUA).  This EUA will remain in effect (meaning this test can be used) for the duration of  the COVID-19 de claration  under Section 564(b)(1) of the Act, 21 U.S.C. section 360bbb-3(b)(1),  unless the authorization is terminated or revoked sooner.    DG Wrist Complete Right  Result Date: 05/30/2019 CLINICAL DATA:  Seizure EXAM: RIGHT WRIST - COMPLETE 3+ VIEW COMPARISON:  None. FINDINGS: No acute displaced fracture is seen. There is possible old fracture deformity of the distal radius. Degenerative change at the distal radioulnar joint. Lateral view demonstrates dorsal subluxation of the carpal bones with respect to the distal radius with slight widened appearance the dorsal joint space. Soft tissue swelling is evident. IMPRESSION: 1. No acute displaced fracture is seen. 2. Suspected chronic deformity at the distal radius. Lateral view suggests mild dorsal subluxation of the proximal carpal bones with respect to the distal radius with widened appearance of the dorsal joint space. Electronically Signed   By: Jasmine Pang M.D.   On: 05/30/2019 21:30   CT Head Wo Contrast  Result Date: 05/30/2019 CLINICAL DATA:  Head trauma. EXAM: CT HEAD WITHOUT CONTRAST TECHNIQUE: Contiguous axial images were obtained from the base of the skull through the vertex without intravenous contrast. COMPARISON:  May 13, 2019. FINDINGS: Brain: No evidence of acute infarction, hemorrhage, hydrocephalus, extra-axial collection or mass lesion/mass effect. Vascular: No hyperdense vessel or unexpected calcification. Skull: Normal. Negative for fracture or focal lesion. Sinuses/Orbits: There is mucosal thickening of the ethmoid air cells and right frontal sinus. The remaining paranasal sinuses and mastoid air cells are essentially clear. Other: None. IMPRESSION: 1. No acute intracranial abnormality. 2. Sinus disease as detailed. Electronically Signed   By: Katherine Mantle M.D.   On: 05/30/2019 21:23   DG Chest Portable 1 View  Result Date: 05/30/2019 CLINICAL DATA:  Altered mental status EXAM: PORTABLE CHEST 1 VIEW COMPARISON:  05/13/2019  FINDINGS: The heart size and mediastinal contours are within normal limits. Both lungs are clear. The visualized skeletal structures are unremarkable. IMPRESSION: No active disease. Electronically Signed   By: Elige Ko   On: 05/30/2019 21:26    Pending Labs Unresulted Labs (From admission, onward)    Start     Ordered   05/30/19 2230  Respiratory Panel by RT PCR (Flu A&B, Covid) - Nasopharyngeal Swab  (Tier 2 Respiratory Panel by RT PCR (Flu A&B, Covid) (TAT 2 hrs))  ONCE - STAT,   STAT    Question Answer Comment  Is this test for diagnosis or screening Diagnosis of ill patient   Symptomatic for COVID-19 as defined by CDC Unknown   Hospitalized for COVID-19 No   Admitted to ICU for COVID-19 No   Previously tested for COVID-19 Yes   Resident in a congregate (group) care setting Unknown   Employed in healthcare setting Unknown      05/30/19 2229   05/30/19 2143  Urine Drug Screen, Qualitative ([facility] only)  Once,   STAT     05/30/19 2142          Vitals/Pain Today's Vitals   05/30/19 2215 05/30/19 2230 05/30/19 2245 05/30/19 2300  BP: (!) 134/100 112/73 103/67 104/70  Pulse: 95 80 89 81  Resp: 11 15 (!) 22 (!) 22  Temp:  (!) 96.5 F (35.8 C) (!) 96.4 F (35.8 C) (!) 96.2 F (35.7 C)  TempSrc:      SpO2: 94% 98% 98% 99%  Weight:      Height:      PainSc:        Isolation Precautions Airborne and Contact precautions  Medications Medications  propofol (DIPRIVAN) 1000 MG/100ML infusion (100 mcg/kg/min  63 kg Intravenous Rate/Dose Change 05/30/19 2155)  fentaNYL 2565mcg in NS 264mL (6mcg/ml) infusion-PREMIX (50 mcg/hr Intravenous New Bag/Given 05/30/19 2155)  sodium chloride 0.9 % bolus 500 mL (0 mLs Intravenous Stopped 05/30/19 2132)  lactulose (CHRONULAC) 10 GM/15ML solution 30 g (30 g Per Tube Given 05/30/19 2256)  etomidate (AMIDATE) injection 20 mg (20 mg Intravenous Given 05/30/19 2145)  succinylcholine (ANECTINE) injection 100 mg (100 mg Intravenous Given  05/30/19 2146)  fentaNYL (SUBLIMAZE) injection 100 mcg (100 mcg Intravenous Given 05/30/19 2148)  midazolam (VERSED) injection 4 mg (4 mg Intravenous Given 05/30/19 2205)  rocuronium (ZEMURON) injection 100 mg (100 mg Intravenous Given 05/30/19 2236)    Mobility   High fall risk   Focused Assessments    R Recommendations: See Admitting Provider Note  Report given to:

## 2019-05-30 NOTE — ED Triage Notes (Signed)
Pt comes EMS after having seizure while drinking. Pt responding to pain. Pt wife says he has seizures every time he drinks. VSS. GCS 10.

## 2019-05-30 NOTE — ED Notes (Signed)
20mg  etomidate and 100mg  succ verbal per Dr. Quentin Cornwall

## 2019-05-30 NOTE — ED Notes (Signed)
100mg  succ now

## 2019-05-31 ENCOUNTER — Inpatient Hospital Stay: Payer: PRIVATE HEALTH INSURANCE

## 2019-05-31 DIAGNOSIS — K7290 Hepatic failure, unspecified without coma: Principal | ICD-10-CM

## 2019-05-31 LAB — COMPREHENSIVE METABOLIC PANEL
ALT: 63 U/L — ABNORMAL HIGH (ref 0–44)
AST: 50 U/L — ABNORMAL HIGH (ref 15–41)
Albumin: 3.7 g/dL (ref 3.5–5.0)
Alkaline Phosphatase: 67 U/L (ref 38–126)
Anion gap: 12 (ref 5–15)
BUN: 21 mg/dL — ABNORMAL HIGH (ref 6–20)
CO2: 20 mmol/L — ABNORMAL LOW (ref 22–32)
Calcium: 8.2 mg/dL — ABNORMAL LOW (ref 8.9–10.3)
Chloride: 111 mmol/L (ref 98–111)
Creatinine, Ser: 0.74 mg/dL (ref 0.61–1.24)
GFR calc Af Amer: >60 mL/min (ref >60)
GFR calc non Af Amer: >60 mL/min (ref >60)
Glucose, Bld: 80 mg/dL (ref 70–99)
Potassium: 3.7 mmol/L (ref 3.5–5.1)
Sodium: 143 mmol/L (ref 135–145)
Total Bilirubin: 0.7 mg/dL (ref 0.3–1.2)
Total Protein: 7 g/dL (ref 6.5–8.1)

## 2019-05-31 LAB — CREATININE, SERUM
Creatinine, Ser: 0.71 mg/dL (ref 0.61–1.24)
GFR calc Af Amer: >60 mL/min (ref >60)
GFR calc non Af Amer: >60 mL/min (ref >60)

## 2019-05-31 LAB — BRAIN NATRIURETIC PEPTIDE: B Natriuretic Peptide: 15 pg/mL (ref 0.0–100.0)

## 2019-05-31 LAB — CBC
HCT: 35.5 % — ABNORMAL LOW (ref 39.0–52.0)
Hemoglobin: 12.4 g/dL — ABNORMAL LOW (ref 13.0–17.0)
MCH: 31.6 pg (ref 26.0–34.0)
MCHC: 34.9 g/dL (ref 30.0–36.0)
MCV: 90.3 fL (ref 80.0–100.0)
Platelets: 220 10*3/uL (ref 150–400)
RBC: 3.93 MIL/uL — ABNORMAL LOW (ref 4.22–5.81)
RDW: 14.5 % (ref 11.5–15.5)
WBC: 6.7 10*3/uL (ref 4.0–10.5)
nRBC: 0 % (ref 0.0–0.2)

## 2019-05-31 LAB — GLUCOSE, CAPILLARY: Glucose-Capillary: 84 mg/dL (ref 70–99)

## 2019-05-31 LAB — MRSA PCR SCREENING: MRSA by PCR: NEGATIVE

## 2019-05-31 LAB — PROCALCITONIN: Procalcitonin: <0.1 ng/mL

## 2019-05-31 MED ORDER — LORAZEPAM 2 MG/ML IJ SOLN: 1.0000 mg | INTRAMUSCULAR | Status: DC | PRN

## 2019-05-31 MED ORDER — CHLORHEXIDINE GLUCONATE 0.12% ORAL RINSE (MEDLINE KIT)
15.0000 mL | Freq: Two times a day (BID) | OROMUCOSAL | Status: DC
  Administered 2019-05-31: 15 mL via OROMUCOSAL

## 2019-05-31 MED ORDER — CHLORHEXIDINE GLUCONATE CLOTH 2 % EX PADS
6.0000 | MEDICATED_PAD | Freq: Every day | CUTANEOUS | Status: DC
  Administered 2019-06-01 – 2019-06-02 (×2): 6 via TOPICAL

## 2019-05-31 MED ORDER — DEXMEDETOMIDINE HCL IN NACL 400 MCG/100ML IV SOLN: 0.2000 ug/kg/h | INTRAVENOUS | Status: DC

## 2019-05-31 MED ORDER — ORAL CARE MOUTH RINSE: 15.0000 mL | OROMUCOSAL | Status: DC

## 2019-05-31 MED ORDER — LACTULOSE 10 GM/15ML PO SOLN: 10.0000 g | Freq: Two times a day (BID) | ORAL | Status: DC | PRN

## 2019-05-31 NOTE — Progress Notes (Signed)
eLink Physician-Brief Progress Note Patient Name: Joel DEBRUIN Sr. DOB: [birth year] MRN: [id number]   Date of Service  05/31/2019  HPI/Events of Note  Pt admitted through the ED with altered mental status secondary to cirrhosis induced hepatic encephalopathy, acute respiratory failure, suspected aspiration pneumonia.  eICU Interventions  New Patient Evaluation completed.        Joel Tanner 05/31/2019, 3:52 AM

## 2019-05-31 NOTE — Consult Note (Addendum)
Attempted to evaluate patient but he was unarousable.  Based off chart review and speaking with RN the patient extubated himself around 1030 this morning.  The RN reports that the wife was requesting a psychiatric evaluation due to the patient having excessive alcohol abuse and this time it was found that the patient had been positive for cocaine.  She stated that the wife reported the patient does not use cocaine and she feels that the patient would require psychiatric hospitalization.  Feel that this is more of a need for social work involvement and trying to get the patient into a residential rehab.  However, full evaluation will need to be done once the patient is fully alert and oriented.  Case discussed and plan agreed upon as outlined above by Joel Tanner.

## 2019-05-31 NOTE — Progress Notes (Signed)
eeg completed ° °

## 2019-05-31 NOTE — Procedures (Signed)
ELECTROENCEPHALOGRAM REPORT   Patient: Joel Lunger Sr.       Room #: IC05A-AA EEG No. ID: [id number] Age: 48 y.o.        Sex: male Referring Physician: Lanney Gins Report Date:  05/31/2019        Interpreting Physician: Alexis Goodell  History: Joel Lunger Sr. is an 49 y.o. male presenting with seizure like activity  Medications:  Fentanyl, Precedex, Thiamine  Conditions of Recording:  This is a 21 channel routine scalp EEG performed with bipolar and monopolar montages arranged in accordance to the international 10/20 system of electrode placement. One channel was dedicated to EKG recording.  The patient is in the awake state.  Description:  The background activity is slow and poorly organized.  It consists of a low voltage to moderate voltage polymorphic delta activity that is diffusely distributed.  This activity is continuous throughout the recording.   No epileptiform activity is noted.   Hyperventilation and intermittent photic stimulation were not performed.  IMPRESSION: This is an abnormal EEG secondary to general background slowing.  This finding may be seen with a diffuse disturbance that is etiologically nonspecific, but may include a metabolic encephalopathy, among other possibilities.  No epileptiform activity is noted.     Alexis Goodell, MD Neurology [phone] 05/31/2019, 7:11 PM

## 2019-05-31 NOTE — H&P (Signed)
PULMONARY / CRITICAL CARE MEDICINE   Name: Joel Joel Tanner Sr. MRN: [id number] DOB: 1970-12-26    ADMISSION DATE:  05/30/2019 CONSULTATION DATE:  05/30/2019  REFERRING MD: Dr. Quentin Tanner  CHIEF COMPLAINT: Seizure-like activity, alcohol intoxication  BRIEF DISCUSSION: 48 year old male with past medical history notable for seizures on no antiepileptics, ETOH abuse, and Hepatitis C who presents 05/30/2019 due to seizure-like activity, alcohol intoxication, and hepatic encephalopathy.  While in the ED, had decreased level of consciousness requiring intubation for airway protection.  Urine drug screen is positive for cocaine and cannabinoid.  HISTORY OF PRESENT ILLNESS:   Joel Tanner is a 48 year old male with a past medical history notable for alcohol abuse, hepatitis C, elevated transaminase, seizures, and tobacco abuse who presents to [facility] ED on 05/30/2019 with seizure-like activity.  Patiently is currently intubated and sedated with no family present, therefore history is obtained from ED and nursing notes.  Per notes has exhibited the seizure-like episodes after he has been drinking.  He is currently not on any antiepileptic medications as an outpatient.  He was also recently admitted approximately 1 month ago for similar symptoms and found to have hepatic encephalopathy.  Initial work-up in the ED revealed ammonia 115, ethyl alcohol 818, salicylates less than 7, bicarb 20, AST 62, ALT 80.  His COVID-19 and influenza PCR's are both negative.  CT head negative for any acute intracranial abnormality.  Chest x-ray is negative for any acute disease process.  Urine drug screen is positive for cocaine and cannabinoid.  While in the ED he became increasingly somnolent, with decrease in his GCS from 10 to 6.  He was subsequently intubated for airway protection.  PCCM is asked to admit the patient to ICU for further work-up and treatment of alcohol intoxication and hepatic encephalopathy requiring intubation  for airway protection.  PAST MEDICAL HISTORY :  He  has a past medical history of Chronic alcohol abuse, Elevated transaminase level, Hepatitis C, Seizure ([place]), and Tobacco use.  PAST SURGICAL HISTORY: He  has a past surgical history that includes Carpal tunnel release.  No Known Allergies  No current facility-administered medications on file prior to encounter.   Current Outpatient Medications on File Prior to Encounter  Medication Sig  . atorvastatin (LIPITOR) 40 MG tablet Take 1 tablet (40 mg total) by mouth daily at 6 PM.  . B Complex-Biotin-FA (B-100 B-COMPLEX PO) Take 1 tablet by mouth daily.  Marland Kitchen disulfiram (ANTABUSE) 250 MG tablet Take 250 mg by mouth daily.  . folic acid (FOLVITE) 1 MG tablet Take 1 tablet (1 mg total) by mouth daily.  Marland Kitchen lactulose (CHRONULAC) 10 GM/15ML solution Take 15 mLs (10 g total) by mouth 2 (two) times daily as needed for mild constipation (And confusion).  . tadalafil (CIALIS) 20 MG tablet Take 20 mg by mouth daily as needed for erectile dysfunction.  Marland Kitchen venlafaxine XR (EFFEXOR-XR) 150 MG 24 hr capsule Take 150 mg by mouth daily.  . Multiple Vitamins-Minerals (MULTIVITAMIN WITH MINERALS) tablet Take 1 tablet by mouth daily. (Patient not taking: Reported on 05/30/2019)  . omeprazole (PRILOSEC) 20 MG capsule Take 20 mg by mouth daily as needed.  . thiamine 100 MG tablet Take 1 tablet (100 mg total) by mouth daily. (Patient not taking: Reported on 05/30/2019)    FAMILY HISTORY:  His has no family status information on file.    SOCIAL HISTORY: He  reports that he has been smoking cigars. He has been smoking about 6.00 packs per day. He has  never used smokeless tobacco. He reports current alcohol use of about 6.0 standard drinks of alcohol per week. He reports current drug use. Drug: Marijuana.    COVID-19 DISASTER DECLARATION:  FULL CONTACT PHYSICAL EXAMINATION WAS NOT POSSIBLE DUE TO TREATMENT OF COVID-19 AND  CONSERVATION OF PERSONAL PROTECTIVE  EQUIPMENT, LIMITED EXAM FINDINGS INCLUDE-  Patient assessed or the symptoms described in the history of present illness.  In the context of the Global COVID-19 pandemic, which necessitated consideration that the patient might be at risk for infection with the SARS-CoV-2 virus that causes COVID-19, Institutional protocols and algorithms that pertain to the evaluation of patients at risk for COVID-19 are in a state of rapid change based on information released by regulatory bodies including the CDC and federal and state organizations. These policies and algorithms were followed during the patient's care while in hospital.   REVIEW OF SYSTEMS:   Unable to assess due to critical illness and intubation  SUBJECTIVE:  Unable to assess due to critical illness and intubation  VITAL SIGNS: BP 111/75   Pulse 80   Temp (!) 95.5 F (35.3 C)   Resp (!) 23   Ht _0  (1.778 m)   Wt 63 kg   SpO2 98%   BMI 19.93 kg/m   HEMODYNAMICS:    VENTILATOR SETTINGS: Vent Mode: AC FiO2 (%):  [40 %] 40 % Set Rate:  [14 bmp] 14 bmp Vt Set:  [500 mL] 500 mL PEEP:  [5 cmH20] 5 cmH20  INTAKE / OUTPUT: No intake/output data recorded.  PHYSICAL EXAMINATION: General: Acutely ill-appearing male, laying in bed, intubated and sedated, no acute distress Neuro: Sedated, withdraws from pain, pupils PERRLA HEENT: Atraumatic, normocephalic, neck supple, no JVD Cardiovascular: Regular rate and rhythm, S1-S2 noted, no murmurs, rubs or gallops.  2+ pulses throughout Lungs: Clear to auscultation bilaterally, no wheezing, even, vent assisted Abdomen: Soft, nontender, nondistended, no guarding or rebound tenderness, bowel sounds positive x4 Musculoskeletal: No deformities, normal bulk and tone, no edema Skin: Warm and dry, no obvious rashes, lesions, or ulcerations  LABS:  BMET Recent Labs  Lab 05/30/19 2039  NA 142  K 4.0  CL 107  CO2 20*  BUN 17  CREATININE 0.73  GLUCOSE 107*    Electrolytes Recent  Labs  Lab 05/30/19 2039  CALCIUM 9.1    CBC Recent Labs  Lab 05/30/19 2039  WBC 7.6  HGB 14.0  HCT 40.6  PLT 239    Coag's Recent Labs  Lab 05/30/19 2039  INR 1.0    Sepsis Markers No results for input(s): LATICACIDVEN, PROCALCITON, O2SATVEN in the last 168 hours.  ABG Recent Labs  Lab 05/30/19 2318  PHART 7.33*  PCO2ART 41  PO2ART 205*    Liver Enzymes Recent Labs  Lab 05/30/19 2039  AST 62*  ALT 80*  ALKPHOS 88  BILITOT 0.7  ALBUMIN 4.6    Cardiac Enzymes No results for input(s): TROPONINI, PROBNP in the last 168 hours.  Glucose No results for input(s): GLUCAP in the last 168 hours.  Imaging DG Wrist Complete Right  Result Date: 05/30/2019 CLINICAL DATA:  Seizure EXAM: RIGHT WRIST - COMPLETE 3+ VIEW COMPARISON:  None. FINDINGS: No acute displaced fracture is seen. There is possible old fracture deformity of the distal radius. Degenerative change at the distal radioulnar joint. Lateral view demonstrates dorsal subluxation of the carpal bones with respect to the distal radius with slight widened appearance the dorsal joint space. Soft tissue swelling is evident. IMPRESSION: 1. No acute  displaced fracture is seen. 2. Suspected chronic deformity at the distal radius. Lateral view suggests mild dorsal subluxation of the proximal carpal bones with respect to the distal radius with widened appearance of the dorsal joint space. Electronically Signed   By: Donavan Foil M.D.   On: 05/30/2019 21:30   CT Head Wo Contrast  Result Date: 05/30/2019 CLINICAL DATA:  Head trauma. EXAM: CT HEAD WITHOUT CONTRAST TECHNIQUE: Contiguous axial images were obtained from the base of the skull through the vertex without intravenous contrast. COMPARISON:  May 13, 2019. FINDINGS: Brain: No evidence of acute infarction, hemorrhage, hydrocephalus, extra-axial collection or mass lesion/mass effect. Vascular: No hyperdense vessel or unexpected calcification. Skull: Normal.  Negative for fracture or focal lesion. Sinuses/Orbits: There is mucosal thickening of the ethmoid air cells and right frontal sinus. The remaining paranasal sinuses and mastoid air cells are essentially clear. Other: None. IMPRESSION: 1. No acute intracranial abnormality. 2. Sinus disease as detailed. Electronically Signed   By: Constance Holster M.D.   On: 05/30/2019 21:23   DG Chest Portable 1 View  Result Date: 05/30/2019 CLINICAL DATA:  Status post intubation. EXAM: PORTABLE CHEST 1 VIEW COMPARISON:  May 30, 2019. FINDINGS: The endotracheal tube terminates approximately 3 cm above the carina. The enteric tube terminates over the gastric body/fundus. The heart size is stable from prior study. There is no pneumothorax. There is suggestion of interval development of subtle hazy airspace opacities bilaterally. There are few Kerley B lines at the lung bases bilaterally. There is no large pleural effusion. IMPRESSION: 1. Lines and tubes as above. 2. Possible developing pulmonary edema. Electronically Signed   By: Constance Holster M.D.   On: 05/30/2019 23:16   DG Chest Portable 1 View  Result Date: 05/30/2019 CLINICAL DATA:  Altered mental status EXAM: PORTABLE CHEST 1 VIEW COMPARISON:  05/13/2019 FINDINGS: The heart size and mediastinal contours are within normal limits. Both lungs are clear. The visualized skeletal structures are unremarkable. IMPRESSION: No active disease. Electronically Signed   By: Kathreen Devoid   On: 05/30/2019 21:26     STUDIES:  CT Head w/o contrast 12/30>> 1. No acute intracranial abnormality. 2. There is mucosal thickening of the ethmoid air cells and right frontal sinus. The remaining paranasal sinuses and mastoid air cells are essentially clear. DG Wrist 12/30>> 1. No acute displaced fracture is seen. 2. Suspected chronic deformity at the distal radius. Lateral view suggests mild dorsal subluxation of the proximal carpal bones with respect to the distal radius  with widened appearance of the dorsal joint space.  CULTURES: COVID-19 AG 12/30>> negative SARS-CoV-2 PCR 12/30>> negative Influenza PCR 12/30>> negative  ANTIBIOTICS: N/A  SIGNIFICANT EVENTS: 12/30>> presented to ED 12/30>> became very somnolent requiring intubation for airway protection  LINES/TUBES: ET tube 12/30>>   ASSESSMENT / PLAN:  PULMONARY A: Intubated for airway protection in setting of alcohol intoxication and hepatic encephalopathy P:   Full vent support Wean FiO2 and PEEP as tolerated to maintain O2 saturations greater than 92% Follow intermittent ABG and chest x-ray as needed As needed bronchodilators Spontaneous breathing trials when respiratory parameters met and mental status permits  CARDIOVASCULAR A:  ?  Developing pulmonary edema P:  Continuous cardiac monitoring Maintain MAP greater than 65 Cautious IV fluids Levophed if needed to maintain map goal Obtain 2D echocardiogram Check BNP  RENAL A:   No acute issues P:   Monitor I&O's / urinary output Follow BMP Ensure adequate renal perfusion Avoid nephrotoxic agents as able Replace electrolytes  as indicated Gentle IV Fluids  GASTROINTESTINAL A:   Hyperammonemia Hx: Hepatitis C P:   N.p.o. for now Protonix for Stress ulcer Prophylaxis Follow LFTs and ammonia Continue home lactulose Place on rifaximin  HEMATOLOGIC A:   No acute issues P:  Monitor for S/Sx of bleeding Trend CBC Subcu heparin for VTE Prophylaxis  Transfuse for Hgb <7  INFECTIOUS A:   No acute issues P:   Monitor fever curve Trend WBCs Check procalcitonin to assess for possible aspiration with somnolence  ENDOCRINE A:   No acute issues P:   CBGs Sliding-scale insulin if needed Follow ICU hypo/hyperglycemia protocol  NEUROLOGIC A:   Acute metabolic encephalopathy in the setting of alcohol intoxication and hepatic encephalopathy Urine drug screen positive for cocaine and cannabis Seizure-like  activity, likely in setting of ETOH intoxication P:   RASS goal: -1 to -2 Propofol and Fentanyl drips to maintain RASS goal Avoid sedating meds as able Daily WUA Provide supportive care Follow Ammonia Continue Lactulose and Rifaximin Monitor for s/sx of ETOH withdrawal Place on Thiamine, Folic acid, MVI Encourage substance abuse cessation Obtain EEG Prn Versed for additional seizures Will hold off on antiepileptic for now Consider Neurology consult   FAMILY  - Updates: No family present at bedside during NP rounds.  Pt is a Full Code.  - Inter-disciplinary family meet or Palliative Care meeting due by:  06/05/2018    Darel Hong, AGACNP-BC [place] [facility] Pager: [phone]  05/31/2019, 12:40 AM

## 2019-05-31 NOTE — ED Notes (Addendum)
ED TO INPATIENT HANDOFF REPORT  ED Nurse Name and Phone #: Faythe Ghee  S Name/Age/Gender Joel Blanco Sr. 48 y.o. male Room/Bed: ED11A/ED11A  Code Status   Code Status: Full Code  Home/SNF/Other Home  Is this baseline? Yes   Triage Complete: Triage complete  Chief Complaint Seizures (HCC) [R56.9]  Triage Note Pt comes EMS after having seizure while drinking. Pt responding to pain. Pt wife says he has seizures every time he drinks. VSS. GCS 10.     Allergies No Known Allergies  Level of Care/Admitting Diagnosis ED Disposition    ED Disposition Condition Comment   Admit  Hospital Area: [facility] [[id number]]  Level of Care: ICU [6]  Covid Evaluation: Confirmed COVID Negative  Diagnosis: Seizures (HCC) [440347]  Admitting Physician: Vida Rigger [[id number]]  Attending Physician: Tawni Carnes  Estimated length of stay: 3 - 4 days  Certification:: I certify this patient will need inpatient services for at least 2 midnights       B Medical/Surgery History Past Medical History:  Diagnosis Date  . Chronic alcohol abuse   . Elevated transaminase level   . Hepatitis C   . Seizure (HCC)   . Tobacco use    Past Surgical History:  Procedure Laterality Date  . CARPAL TUNNEL RELEASE       A IV Location/Drains/Wounds Patient Lines/Drains/Airways Status   Active Line/Drains/Airways    Name:   Placement date:   Placement time:   Site:   Days:   Peripheral IV 05/30/19 Left Forearm   05/30/19    -    Forearm   1   Peripheral IV 05/30/19 Right Other (Comment)   05/30/19    2038    Other (Comment)   1   Urethral Catheter kate, rn Double-lumen 14 Fr.   05/30/19    2217    Double-lumen   1   Airway 7.5 mm   05/30/19    2153     1          Intake/Output Last 24 hours  Intake/Output Summary (Last 24 hours) at 05/31/2019 0229 Last data filed at 05/30/2019 2132 Gross per 24 hour  Intake 500 ml  Output -  Net 500 ml     Labs/Imaging Results for orders placed or performed during the hospital encounter of 05/30/19 (from the past 48 hour(s))  CBC with Differential     Status: None   Collection Time: 05/30/19  8:39 PM  Result Value Ref Range   WBC 7.6 4.0 - 10.5 K/uL   RBC 4.46 4.22 - 5.81 MIL/uL   Hemoglobin 14.0 13.0 - 17.0 g/dL   HCT 87.5 64.3 - 32.9 %   MCV 91.0 80.0 - 100.0 fL   MCH 31.4 26.0 - 34.0 pg   MCHC 34.5 30.0 - 36.0 g/dL   RDW 51.8 84.1 - 66.0 %   Platelets 239 150 - 400 K/uL   nRBC 0.0 0.0 - 0.2 %   Neutrophils Relative % 59 %   Neutro Abs 4.5 1.7 - 7.7 K/uL   Lymphocytes Relative 32 %   Lymphs Abs 2.4 0.7 - 4.0 K/uL   Monocytes Relative 7 %   Monocytes Absolute 0.5 0.1 - 1.0 K/uL   Eosinophils Relative 1 %   Eosinophils Absolute 0.1 0.0 - 0.5 K/uL   Basophils Relative 1 %   Basophils Absolute 0.1 0.0 - 0.1 K/uL   Immature Granulocytes 0 %   Abs Immature Granulocytes 0.03 0.00 -  0.07 K/uL    Comment: Performed at [facility], [address] [place], [place] [address]  Comprehensive metabolic panel     Status: Abnormal   Collection Time: 05/30/19  8:39 PM  Result Value Ref Range   Sodium 142 135 - 145 mmol/L   Potassium 4.0 3.5 - 5.1 mmol/L   Chloride 107 98 - 111 mmol/L   CO2 20 (L) 22 - 32 mmol/L   Glucose, Bld 107 (H) 70 - 99 mg/dL   BUN 17 6 - 20 mg/dL   Creatinine, Ser 1.91 0.61 - 1.24 mg/dL   Calcium 9.1 8.9 - 47.8 mg/dL   Total Protein 8.4 (H) 6.5 - 8.1 g/dL   Albumin 4.6 3.5 - 5.0 g/dL   AST 62 (H) 15 - 41 U/L   ALT 80 (H) 0 - 44 U/L   Alkaline Phosphatase 88 38 - 126 U/L   Total Bilirubin 0.7 0.3 - 1.2 mg/dL   GFR calc non Af Amer >60 >60 mL/min   GFR calc Af Amer >60 >60 mL/min   Anion gap 15 5 - 15    Comment: Performed at [facility], [address] [place], [place] [address]  Protime-INR     Status: None   Collection Time: 05/30/19  8:39 PM  Result Value Ref Range   Prothrombin Time 13.3 11.4 - 15.2 seconds   INR 1.0 0.8 - 1.2     Comment: (NOTE) INR goal varies based on device and disease states. Performed at [facility], [address] [place], [place] [address]   Ethanol     Status: Abnormal   Collection Time: 05/30/19  8:39 PM  Result Value Ref Range   Alcohol, Ethyl (B) 181 (H) <10 mg/dL    Comment: (NOTE) Lowest detectable limit for serum alcohol is 10 mg/dL. For medical purposes only. Performed at [facility], [address] [place], [place] [address]   Salicylate level     Status: Abnormal   Collection Time: 05/30/19  8:39 PM  Result Value Ref Range   Salicylate Lvl <7.0 (L) 7.0 - 30.0 mg/dL    Comment: Performed at [facility], [address] [place], [place] [address]  Ammonia     Status: Abnormal   Collection Time: 05/30/19  8:47 PM  Result Value Ref Range   Ammonia 110 (H) 9 - 35 umol/L    Comment: Performed at [facility], [address], [place], [place] [address]  Respiratory Panel by RT PCR (Flu A&B, Covid) - Nasopharyngeal Swab     Status: None   Collection Time: 05/30/19  9:44 PM   Specimen: Nasopharyngeal Swab  Result Value Ref Range   SARS Coronavirus 2 by RT PCR NEGATIVE NEGATIVE    Comment: (NOTE) SARS-CoV-2 target nucleic acids are NOT DETECTED. The SARS-CoV-2 RNA is generally detectable in upper respiratoy specimens during the acute phase of infection. The lowest concentration of SARS-CoV-2 viral copies this assay can detect is 131 copies/mL. A negative result does not preclude SARS-Cov-2 infection and should not be used as the sole basis for treatment or other patient management decisions. A negative result may occur with  improper specimen collection/handling, submission of specimen other than nasopharyngeal swab, presence of viral mutation(s) within the areas targeted by this assay, and inadequate number of viral copies (<131 copies/mL). A negative result must be combined with clinical observations, patient history, and  epidemiological information. The expected result is Negative. Fact Sheet for Patients:  https://www.moore.com/  Fact Sheet for Healthcare Providers:  https://www.young.biz/ This test is not yet ap proved or cleared by the [place] FDA and  has been authorized for detection and/or diagnosis of SARS-CoV-2 by FDA under an Emergency Use Authorization (EUA). This EUA will remain  in effect (meaning this test can be used) for the duration of the COVID-19 declaration under Section 564(b)(1) of the Act, 21 U.S.C. section 360bbb-3(b)(1), unless the authorization is terminated or revoked sooner.    Influenza A by PCR NEGATIVE NEGATIVE   Influenza B by PCR NEGATIVE NEGATIVE    Comment: (NOTE) The Xpert Xpress SARS-CoV-2/FLU/RSV assay is intended as an aid in  the diagnosis of influenza from Nasopharyngeal swab specimens and  should not be used as a sole basis for treatment. Nasal washings and  aspirates are unacceptable for Xpert Xpress SARS-CoV-2/FLU/RSV  testing. Fact Sheet for Patients: https://www.moore.com/ Fact Sheet for Healthcare Providers: https://www.young.biz/ This test is not yet approved or cleared by the [place] FDA and  has been authorized for detection and/or diagnosis of SARS-CoV-2 by  FDA under an Emergency Use Authorization (EUA). This EUA will remain  in effect (meaning this test can be used) for the duration of the  Covid-19 declaration under Section 564(b)(1) of the Act, 21  U.S.C. section 360bbb-3(b)(1), unless the authorization is  terminated or revoked. Performed at [facility], [address] [place], [place] [address]   POC SARS Coronavirus 2 Ag     Status: None   Collection Time: 05/30/19 10:05 PM  Result Value Ref Range   SARS Coronavirus 2 Ag NEGATIVE NEGATIVE    Comment: (NOTE) SARS-CoV-2 antigen NOT DETECTED.  Negative results are presumptive.  Negative  results do not preclude SARS-CoV-2 infection and should not be used as the sole basis for treatment or other patient management decisions, including infection  control decisions, particularly in the presence of clinical signs and  symptoms consistent with COVID-19, or in those who have been in contact with the virus.  Negative results must be combined with clinical observations, patient history, and epidemiological information. The expected result is Negative. Fact Sheet for Patients: https://sanders-williams.net/ Fact Sheet for Healthcare Providers: https://martinez.com/ This test is not yet approved or cleared by the [place] FDA and  has been authorized for detection and/or diagnosis of SARS-CoV-2 by FDA under an Emergency Use Authorization (EUA).  This EUA will remain in effect (meaning this test can be used) for the duration of  the COVID-19 de claration under Section 564(b)(1) of the Act, 21 U.S.C. section 360bbb-3(b)(1), unless the authorization is terminated or revoked sooner.   Urine Drug Screen, Qualitative ([facility] only)     Status: Abnormal   Collection Time: 05/30/19 10:18 PM  Result Value Ref Range   Tricyclic, Ur Screen NONE DETECTED NONE DETECTED   Amphetamines, Ur Screen NONE DETECTED NONE DETECTED   MDMA (Ecstasy)Ur Screen NONE DETECTED NONE DETECTED   Cocaine Metabolite,Ur [place] POSITIVE (A) NONE DETECTED   Opiate, Ur Screen NONE DETECTED NONE DETECTED   Phencyclidine (PCP) Ur S NONE DETECTED NONE DETECTED   Cannabinoid 50 Ng, Ur [place] POSITIVE (A) NONE DETECTED   Barbiturates, Ur Screen NONE DETECTED NONE DETECTED   Benzodiazepine, Ur Scrn NONE DETECTED NONE DETECTED   Methadone Scn, Ur NONE DETECTED NONE DETECTED    Comment: (NOTE) Tricyclics + metabolites, urine    Cutoff 1000 ng/mL Amphetamines + metabolites, urine  Cutoff 1000 ng/mL MDMA (Ecstasy), urine  Cutoff 500 ng/mL Cocaine Metabolite, urine          Cutoff 300  ng/mL Opiate + metabolites, urine        Cutoff 300 ng/mL Phencyclidine (PCP), urine         Cutoff 25 ng/mL Cannabinoid, urine                 Cutoff 50 ng/mL Barbiturates + metabolites, urine  Cutoff 200 ng/mL Benzodiazepine, urine              Cutoff 200 ng/mL Methadone, urine                   Cutoff 300 ng/mL The urine drug screen provides only a preliminary, unconfirmed analytical test result and should not be used for non-medical purposes. Clinical consideration and professional judgment should be applied to any positive drug screen result due to possible interfering substances. A more specific alternate chemical method must be used in order to obtain a confirmed analytical result. Gas chromatography / mass spectrometry (GC/MS) is the preferred confirmat ory method. Performed at [facility], [address] [place], [place] [address]   Blood gas, arterial     Status: Abnormal   Collection Time: 05/30/19 11:18 PM  Result Value Ref Range   FIO2 0.40    Delivery systems VENTILATOR    Mode ASSIST CONTROL    VT 500 mL   LHR 14 resp/min   Peep/cpap 5.0 cm H20   pH, Arterial 7.33 (L) 7.350 - 7.450   pCO2 arterial 41 32.0 - 48.0 mmHg   pO2, Arterial 205 (H) 83.0 - 108.0 mmHg   Bicarbonate 21.6 20.0 - 28.0 mmol/L   Acid-base deficit 4.1 (H) 0.0 - 2.0 mmol/L   O2 Saturation 99.7 %   Patient temperature 37.0    Collection site RIGHT RADIAL    Sample type ARTERIAL DRAW    Allens test (pass/fail) PASS PASS    Comment: Performed at [facility], [address] [place], [place] [address]   DG Wrist Complete Right  Result Date: 05/30/2019 CLINICAL DATA:  Seizure EXAM: RIGHT WRIST - COMPLETE 3+ VIEW COMPARISON:  None. FINDINGS: No acute displaced fracture is seen. There is possible old fracture deformity of the distal radius. Degenerative change at the distal radioulnar joint. Lateral view demonstrates dorsal subluxation of the carpal bones with respect to the  distal radius with slight widened appearance the dorsal joint space. Soft tissue swelling is evident. IMPRESSION: 1. No acute displaced fracture is seen. 2. Suspected chronic deformity at the distal radius. Lateral view suggests mild dorsal subluxation of the proximal carpal bones with respect to the distal radius with widened appearance of the dorsal joint space. Electronically Signed   By: Jasmine PangKim  Fujinaga M.D.   On: 05/30/2019 21:30   CT Head Wo Contrast  Result Date: 05/30/2019 CLINICAL DATA:  Head trauma. EXAM: CT HEAD WITHOUT CONTRAST TECHNIQUE: Contiguous axial images were obtained from the base of the skull through the vertex without intravenous contrast. COMPARISON:  May 13, 2019. FINDINGS: Brain: No evidence of acute infarction, hemorrhage, hydrocephalus, extra-axial collection or mass lesion/mass effect. Vascular: No hyperdense vessel or unexpected calcification. Skull: Normal. Negative for fracture or focal lesion. Sinuses/Orbits: There is mucosal thickening of the ethmoid air cells and right frontal sinus. The remaining paranasal sinuses and mastoid air cells are essentially clear. Other: None. IMPRESSION: 1. No acute intracranial abnormality. 2. Sinus disease as detailed. Electronically Signed   By: Beryle Quanthristopher  Green M.D.  On: 05/30/2019 21:23   DG Chest Portable 1 View  Result Date: 05/30/2019 CLINICAL DATA:  Status post intubation. EXAM: PORTABLE CHEST 1 VIEW COMPARISON:  May 30, 2019. FINDINGS: The endotracheal tube terminates approximately 3 cm above the carina. The enteric tube terminates over the gastric body/fundus. The heart size is stable from prior study. There is no pneumothorax. There is suggestion of interval development of subtle hazy airspace opacities bilaterally. There are few Kerley B lines at the lung bases bilaterally. There is no large pleural effusion. IMPRESSION: 1. Lines and tubes as above. 2. Possible developing pulmonary edema. Electronically Signed   By:  Constance Holster M.D.   On: 05/30/2019 23:16   DG Chest Portable 1 View  Result Date: 05/30/2019 CLINICAL DATA:  Altered mental status EXAM: PORTABLE CHEST 1 VIEW COMPARISON:  05/13/2019 FINDINGS: The heart size and mediastinal contours are within normal limits. Both lungs are clear. The visualized skeletal structures are unremarkable. IMPRESSION: No active disease. Electronically Signed   By: Kathreen Devoid   On: 05/30/2019 21:26    Pending Labs Unresulted Labs (From admission, onward)    Start     Ordered   05/31/19 0500  CBC  Tomorrow morning,   STAT     05/30/19 2305   05/31/19 0500  Comprehensive metabolic panel  Tomorrow morning,   STAT     05/30/19 2307   05/31/19 0017  Brain natriuretic peptide  Add-on,   AD     05/31/19 0016   05/30/19 2327  Creatinine, serum  (heparin)  Add-on,   AD    Comments: Baseline for heparin therapy IF NOT ALREADY DRAWN.    05/30/19 2326   05/30/19 2327  Procalcitonin - Baseline  Add-on,   AD     05/30/19 2326   05/30/19 2304  Urinalysis, Routine w reflex microscopic  Once,   STAT     05/30/19 2305          Vitals/Pain Today's Vitals   05/31/19 0130 05/31/19 0145 05/31/19 0200 05/31/19 0215  BP: 121/72 118/77 (!) 138/92 125/82  Pulse: 87 87 87 88  Resp: (!) 27 (!) 29 15 (!) 26  Temp: 98 F (36.7 C) 98.1 F (36.7 C) 98.1 F (36.7 C) 98.1 F (36.7 C)  TempSrc:      SpO2: 99% 98% 98% 98%  Weight:      Height:      PainSc:        Isolation Precautions Airborne and Contact precautions  Medications Medications  propofol (DIPRIVAN) 1000 MG/100ML infusion (80 mcg/kg/min  63 kg Intravenous New Bag/Given 05/31/19 0218)  fentaNYL 2526mcg in NS 266mL (55mcg/ml) infusion-PREMIX (50 mcg/hr Intravenous New Bag/Given 05/30/19 2155)  heparin injection 5,000 Units (has no administration in time range)  pantoprazole (PROTONIX) injection 40 mg (has no administration in time range)  lactated ringers infusion ( Intravenous New Bag/Given 05/31/19  0013)  acetaminophen (TYLENOL) tablet 650 mg (has no administration in time range)  ondansetron (ZOFRAN) injection 4 mg (has no administration in time range)  ipratropium-albuterol (DUONEB) 0.5-2.5 (3) MG/3ML nebulizer solution 3 mL (has no administration in time range)  midazolam (VERSED) injection 2 mg (has no administration in time range)  midazolam (VERSED) injection 2 mg (has no administration in time range)  fentaNYL (SUBLIMAZE) injection 50 mcg (has no administration in time range)  fentaNYL (SUBLIMAZE) bolus via infusion 50 mcg (50 mcg Intravenous Bolus from Bag 05/31/19 0220)  thiamine tablet 100 mg (has no administration in time range)  Or  thiamine (B-1) injection 100 mg (has no administration in time range)  folic acid (FOLVITE) tablet 1 mg (has no administration in time range)  multivitamin with minerals tablet 1 tablet (has no administration in time range)  rifaximin (XIFAXAN) tablet 200 mg (has no administration in time range)  sodium chloride 0.9 % bolus 500 mL (0 mLs Intravenous Stopped 05/30/19 2132)  lactulose (CHRONULAC) 10 GM/15ML solution 30 g (30 g Per Tube Given 05/30/19 2256)  etomidate (AMIDATE) injection 20 mg (20 mg Intravenous Given 05/30/19 2145)  succinylcholine (ANECTINE) injection 100 mg (100 mg Intravenous Given 05/30/19 2146)  fentaNYL (SUBLIMAZE) injection 100 mcg (100 mcg Intravenous Given 05/30/19 2148)  midazolam (VERSED) injection 4 mg (4 mg Intravenous Given 05/30/19 2205)  rocuronium (ZEMURON) injection 100 mg (100 mg Intravenous Given 05/30/19 2236)    Mobility  High fall risk   Focused Assessments   R Recommendations: See Admitting Provider Note  Report given to: Aram Beecham, RN

## 2019-05-31 NOTE — Progress Notes (Signed)
Pt self extubated. Maintaining on 4 LPM at this time.

## 2019-06-01 LAB — CBC
HCT: 36.2 % — ABNORMAL LOW (ref 39.0–52.0)
Hemoglobin: 12.2 g/dL — ABNORMAL LOW (ref 13.0–17.0)
MCH: 31.2 pg (ref 26.0–34.0)
MCHC: 33.7 g/dL (ref 30.0–36.0)
MCV: 92.6 fL (ref 80.0–100.0)
Platelets: 181 10*3/uL (ref 150–400)
RBC: 3.91 MIL/uL — ABNORMAL LOW (ref 4.22–5.81)
RDW: 13.3 % (ref 11.5–15.5)
WBC: 10.4 10*3/uL (ref 4.0–10.5)
nRBC: 0 % (ref 0.0–0.2)

## 2019-06-01 LAB — BASIC METABOLIC PANEL
Anion gap: 10 (ref 5–15)
BUN: 23 mg/dL — ABNORMAL HIGH (ref 6–20)
CO2: 22 mmol/L (ref 22–32)
Calcium: 8.7 mg/dL — ABNORMAL LOW (ref 8.9–10.3)
Chloride: 110 mmol/L (ref 98–111)
Creatinine, Ser: 0.67 mg/dL (ref 0.61–1.24)
GFR calc Af Amer: >60 mL/min (ref >60)
GFR calc non Af Amer: >60 mL/min (ref >60)
Glucose, Bld: 95 mg/dL (ref 70–99)
Potassium: 3.3 mmol/L — ABNORMAL LOW (ref 3.5–5.1)
Sodium: 142 mmol/L (ref 135–145)

## 2019-06-01 LAB — AMMONIA: Ammonia: 34 umol/L (ref 9–35)

## 2019-06-01 MED ORDER — LORAZEPAM 2 MG/ML IJ SOLN: 1.0000 mg | INTRAMUSCULAR | Status: DC | PRN

## 2019-06-01 NOTE — H&P (Signed)
PULMONARY / CRITICAL CARE MEDICINE   Name: Joel NEVILLE Sr. MRN: [id number] DOB: 1970-12-26    ADMISSION DATE:  05/30/2019 CONSULTATION DATE:  05/30/2019  REFERRING MD: Dr. Quentin Cornwall  CHIEF COMPLAINT: Seizure-like activity, alcohol intoxication  BRIEF DISCUSSION: 49 year old male with past medical history notable for seizures on no antiepileptics, ETOH abuse, and Hepatitis C who presents 05/30/2019 due to seizure-like activity, alcohol intoxication, and hepatic encephalopathy.  While in the ED, had decreased level of consciousness requiring intubation for airway protection.  Urine drug screen is positive for cocaine and cannabinoid.  HISTORY OF PRESENT ILLNESS:   Joel Tanner is a 49 year old male with a past medical history notable for alcohol abuse, hepatitis C, elevated transaminase, seizures, and tobacco abuse who presents to [facility] ED on 05/30/2019 with seizure-like activity.  Patiently is currently intubated and sedated with no family present, therefore history is obtained from ED and nursing notes.  Per notes has exhibited the seizure-like episodes after he has been drinking.  He is currently not on any antiepileptic medications as an outpatient.  He was also recently admitted approximately 1 month ago for similar symptoms and found to have hepatic encephalopathy.  Initial work-up in the ED revealed ammonia 115, ethyl alcohol 818, salicylates less than 7, bicarb 20, AST 62, ALT 80.  His COVID-19 and influenza PCR's are both negative.  CT head negative for any acute intracranial abnormality.  Chest x-ray is negative for any acute disease process.  Urine drug screen is positive for cocaine and cannabinoid.  While in the ED he became increasingly somnolent, with decrease in his GCS from 10 to 6.  He was subsequently intubated for airway protection.  PCCM is asked to admit the patient to ICU for further work-up and treatment of alcohol intoxication and hepatic encephalopathy requiring intubation  for airway protection.  PAST MEDICAL HISTORY :  He  has a past medical history of Chronic alcohol abuse, Elevated transaminase level, Hepatitis C, Seizure ([place]), and Tobacco use.  PAST SURGICAL HISTORY: He  has a past surgical history that includes Carpal tunnel release.  No Known Allergies  No current facility-administered medications on file prior to encounter.   Current Outpatient Medications on File Prior to Encounter  Medication Sig  . atorvastatin (LIPITOR) 40 MG tablet Take 1 tablet (40 mg total) by mouth daily at 6 PM.  . B Complex-Biotin-FA (B-100 B-COMPLEX PO) Take 1 tablet by mouth daily.  Marland Kitchen disulfiram (ANTABUSE) 250 MG tablet Take 250 mg by mouth daily.  . folic acid (FOLVITE) 1 MG tablet Take 1 tablet (1 mg total) by mouth daily.  Marland Kitchen lactulose (CHRONULAC) 10 GM/15ML solution Take 15 mLs (10 g total) by mouth 2 (two) times daily as needed for mild constipation (And confusion).  . tadalafil (CIALIS) 20 MG tablet Take 20 mg by mouth daily as needed for erectile dysfunction.  Marland Kitchen venlafaxine XR (EFFEXOR-XR) 150 MG 24 hr capsule Take 150 mg by mouth daily.  . Multiple Vitamins-Minerals (MULTIVITAMIN WITH MINERALS) tablet Take 1 tablet by mouth daily. (Patient not taking: Reported on 05/30/2019)  . omeprazole (PRILOSEC) 20 MG capsule Take 20 mg by mouth daily as needed.  . thiamine 100 MG tablet Take 1 tablet (100 mg total) by mouth daily. (Patient not taking: Reported on 05/30/2019)    FAMILY HISTORY:  His has no family status information on file.    SOCIAL HISTORY: He  reports that he has been smoking cigars. He has been smoking about 6.00 packs per day. He has  never used smokeless tobacco. He reports current alcohol use of about 6.0 standard drinks of alcohol per week. He reports current drug use. Drug: Marijuana.    Physical Exam  Constitutional: He is oriented to person, place, and time. No distress.  HENT:  Head: Normocephalic.  Eyes: EOM are normal.  Cardiovascular:  Normal rate and regular rhythm.  Pulmonary/Chest: No stridor. No respiratory distress. He exhibits no tenderness.  Abdominal: Soft. Bowel sounds are normal.  Musculoskeletal:        General: No tenderness or edema. Normal range of motion.     Cervical back: Normal range of motion.  Neurological: He is alert and oriented to person, place, and time. GCS score is 15.  Skin: Skin is warm and dry. He is not diaphoretic.  Psychiatric: Affect normal.    REVIEW OF SYSTEMS:   Unable to assess due to critical illness and intubation  SUBJECTIVE:  Unable to assess due to critical illness and intubation  VITAL SIGNS: BP (!) 135/91   Pulse 67   Temp 99.7 F (37.6 C) (Bladder)   Resp 15   Ht _0  (1.778 m)   Wt 69.9 kg   SpO2 98%   BMI 22.11 kg/m   HEMODYNAMICS:    VENTILATOR SETTINGS:    INTAKE / OUTPUT: I/O last 3 completed shifts: In: 2414 [I.V.:1914; IV Piggyback:500] Out: 55 [Urine:580]  PHYSICAL EXAMINATION: General: Acutely ill-appearing male, laying in bed, intubated and sedated, no acute distress Neuro: Sedated, withdraws from pain, pupils PERRLA HEENT: Atraumatic, normocephalic, neck supple, no JVD Cardiovascular: Regular rate and rhythm, S1-S2 noted, no murmurs, rubs or gallops.  2+ pulses throughout Lungs: Clear to auscultation bilaterally, no wheezing, even, vent assisted Abdomen: Soft, nontender, nondistended, no guarding or rebound tenderness, bowel sounds positive x4 Musculoskeletal: No deformities, normal bulk and tone, no edema Skin: Warm and dry, no obvious rashes, lesions, or ulcerations  LABS:  BMET Recent Labs  Lab 05/30/19 2039 05/31/19 0450 06/01/19 0440  NA 142 143 142  K 4.0 3.7 3.3*  CL 107 111 110  CO2 20* 20* 22  BUN 17 21* 23*  CREATININE 0.73 0.74  0.71 0.67  GLUCOSE 107* 80 95    Electrolytes Recent Labs  Lab 05/30/19 2039 05/31/19 0450 06/01/19 0440  CALCIUM 9.1 8.2* 8.7*    CBC Recent Labs  Lab 05/30/19 2039  05/31/19 0450 06/01/19 0440  WBC 7.6 6.7 10.4  HGB 14.0 12.4* 12.2*  HCT 40.6 35.5* 36.2*  PLT 239 220 181    Coag's Recent Labs  Lab 05/30/19 2039  INR 1.0    Sepsis Markers Recent Labs  Lab 05/31/19 0450  PROCALCITON <0.10    ABG Recent Labs  Lab 05/30/19 2318  PHART 7.33*  PCO2ART 41  PO2ART 205*    Liver Enzymes Recent Labs  Lab 05/30/19 2039 05/31/19 0450  AST 62* 50*  ALT 80* 63*  ALKPHOS 88 67  BILITOT 0.7 0.7  ALBUMIN 4.6 3.7    Cardiac Enzymes No results for input(s): TROPONINI, PROBNP in the last 168 hours.  Glucose Recent Labs  Lab 05/31/19 0332  GLUCAP 84    Imaging EEG  Result Date: 05/31/2019 Alexis Goodell, MD     05/31/2019  7:17 PM ELECTROENCEPHALOGRAM REPORT Patient: Joel Lunger Sr.       Room #: IC05A-AA EEG No. ID: [id number] Age: 49 y.o.        Sex: male Referring Physician: Lanney Gins Report Date:  05/31/2019  Interpreting Physician: Alexis Goodell History: Joel Lunger Sr. is an 49 y.o. male presenting with seizure like activity Medications: Fentanyl, Precedex, Thiamine Conditions of Recording:  This is a 21 channel routine scalp EEG performed with bipolar and monopolar montages arranged in accordance to the international 10/20 system of electrode placement. One channel was dedicated to EKG recording. The patient is in the awake state. Description:  The background activity is slow and poorly organized.  It consists of a low voltage to moderate voltage polymorphic delta activity that is diffusely distributed.  This activity is continuous throughout the recording.  No epileptiform activity is noted.  Hyperventilation and intermittent photic stimulation were not performed. IMPRESSION: This is an abnormal EEG secondary to general background slowing.  This finding may be seen with a diffuse disturbance that is etiologically nonspecific, but may include a metabolic encephalopathy, among other possibilities.  No epileptiform activity  is noted.  Alexis Goodell, MD Neurology [phone] 05/31/2019, 7:11 PM     STUDIES:  CT Head w/o contrast 12/30>> 1. No acute intracranial abnormality. 2. There is mucosal thickening of the ethmoid air cells and right frontal sinus. The remaining paranasal sinuses and mastoid air cells are essentially clear. DG Wrist 12/30>> 1. No acute displaced fracture is seen. 2. Suspected chronic deformity at the distal radius. Lateral view suggests mild dorsal subluxation of the proximal carpal bones with respect to the distal radius with widened appearance of the dorsal joint space.  CULTURES: COVID-19 AG 12/30>> negative SARS-CoV-2 PCR 12/30>> negative Influenza PCR 12/30>> negative  ANTIBIOTICS: N/A  SIGNIFICANT EVENTS: 12/30>> presented to ED 12/30>> became very somnolent requiring intubation for airway protection  LINES/TUBES: ET tube 12/30>>   ASSESSMENT / PLAN:  PULMONARY A: Intubated for airway protection in setting of alcohol intoxication and hepatic encephalopathy P:   Full vent support Wean FiO2 and PEEP as tolerated to maintain O2 saturations greater than 92% Follow intermittent ABG and chest x-ray as needed As needed bronchodilators Spontaneous breathing trials when respiratory parameters met and mental status permits  CARDIOVASCULAR A:  ?  Developing pulmonary edema P:  Continuous cardiac monitoring Maintain MAP greater than 65 Cautious IV fluids Levophed if needed to maintain map goal Obtain 2D echocardiogram Check BNP  RENAL A:   No acute issues P:   Monitor I&O's / urinary output Follow BMP Ensure adequate renal perfusion Avoid nephrotoxic agents as able Replace electrolytes as indicated Gentle IV Fluids  GASTROINTESTINAL A:   Hyperammonemia Hx: Hepatitis C P:   N.p.o. for now Protonix for Stress ulcer Prophylaxis Follow LFTs and ammonia Continue home lactulose Place on rifaximin  HEMATOLOGIC A:   No acute issues P:  Monitor for  S/Sx of bleeding Trend CBC Subcu heparin for VTE Prophylaxis  Transfuse for Hgb <7  INFECTIOUS A:   No acute issues P:   Monitor fever curve Trend WBCs Check procalcitonin to assess for possible aspiration with somnolence  ENDOCRINE A:   No acute issues P:   CBGs Sliding-scale insulin if needed Follow ICU hypo/hyperglycemia protocol  NEUROLOGIC A:   Acute metabolic encephalopathy in the setting of alcohol intoxication and hepatic encephalopathy Urine drug screen positive for cocaine and cannabis Seizure-like activity, likely in setting of ETOH intoxication P:   RASS goal: -1 to -2 Propofol and Fentanyl drips to maintain RASS goal Avoid sedating meds as able Daily WUA Provide supportive care Follow Ammonia Continue Lactulose and Rifaximin Monitor for s/sx of ETOH withdrawal Place on Thiamine, Folic acid, MVI Encourage substance abuse  cessation Obtain EEG Prn Versed for additional seizures Will hold off on antiepileptic for now Consider Neurology consult   FAMILY  - Updates: No family present at bedside during NP rounds.  Pt is a Full Code.  - Inter-disciplinary family meet or Palliative Care meeting due by:  06/05/2018    Critical care provider statement:    Critical care time (minutes):  32   Critical care time was exclusive of:  Separately billable procedures and  treating other patients   Critical care was necessary to treat or prevent imminent or  life-threatening deterioration of the following conditions:  tonic clonic seizures, hx of substance abuse, multiple comorbid conditions   Critical care was time spent personally by me on the following  activities:  Development of treatment plan with patient or surrogate,  discussions with consultants, evaluation of patient's response to  treatment, examination of patient, obtaining history from patient or  surrogate, ordering and performing treatments and interventions, ordering  and review of laboratory studies  and re-evaluation of patient's condition   I assumed direction of critical care for this patient from another  provider in my specialty: no     Ottie Glazier, M.D.  Pulmonary & [place]

## 2019-06-01 NOTE — Consult Note (Signed)
[facility]   Reason for Consult:   Referring Physician:   Patient Identification: Joel BlancoJohn P Laughner Sr. MRN:  [id number] Principal Diagnosis: <principal problem not specified> Diagnosis:  Active Problems:   Seizures (HCC)   Total Time spent with patient: 1 hour  Subjective:   Joel BlancoJohn P Case Sr. is a 49 y.o. male patient admitted with alcohol intoxication, seizure like activity, hepatic encephalopathy, and Hepatitis C. He was found by his wife having seizure like activity after drinking. Per patient " I am an alcoholic, and have been drinking since the age of 13 years. I woke up like Im here again." He reports smoking THC daily, and denies any other illicit substances. He denies any suicidal thoughts, gestures, ideations, or attempts.    HPI:  Joel BlancoJohn P Rogalski Sr. is a 49 y.o. male presents to the ER for seizure-like activity.  Patient with extensive history of substance abuse alcohol dependence as well as hepatic encephalopathy presents the ER with seizure-like activity.  These episodes apparently only happen the patient has been drinking.  He is not on any antiepileptic medications.  Was admitted this month for similar symptoms and found to be in hepatic encephalopathy  During the evaluation patient is alert and oriented, calm and cooperative. He does appear to be engaging well with Clinical research associatewriter and is empathetic and remorseful about his actions. He does show some improved insight and judgement regarding his alcohol use and is wanting to quit. He denies suicidal ideation and homicidal ideations, however when assessing for homicidality he states " I do not physically want to hurt anyone but I am harming my family everyday by making these decisions to continue to drink. He reports daily use of THC, and denies cocaine use. Advised patient that his UDS was positive for cocaine, he denies knowing anything about cocaine. He reports he drinks about 1/2 pint of alcohol daily and drinks this  after work. At this time patient does not appear to meet inpatient criteria.   Past Psychiatric History:As per patient he denies any previous psych history. He denies any previous psychiatric diagnosis, and or co-occurring diagnoses. He reports alcohol abuse, and one previous residential stay in [place]. He has never been admitted inpatient, and denies any previous outpatient therapy. He recalls some AA support groups He denies any previous suicide attempts.   Risk to Self:  None Risk to Others:  NOne Prior Inpatient Therapy:  None Prior Outpatient Therapy:  None  Past Medical History:  Past Medical History:  Diagnosis Date  . Chronic alcohol abuse   . Elevated transaminase level   . Hepatitis C   . Seizure (HCC)   . Tobacco use     Past Surgical History:  Procedure Laterality Date  . CARPAL TUNNEL RELEASE     Family History: History reviewed. No pertinent family history. Family Psychiatric  History: He denies Social History:  Social History   Substance and Sexual Activity  Alcohol Use Yes  . Alcohol/week: 6.0 standard drinks  . Types: 6 Cans of beer per week   Comment: 18     Social History   Substance and Sexual Activity  Drug Use Yes  . Types: Marijuana    Social History   Socioeconomic History  . Marital status: Married    Spouse name: Not on file  . Number of children: Not on file  . Years of education: Not on file  . Highest education level: Not on file  Occupational History  . Not on file  Tobacco Use  . Smoking status: Current Every Day Smoker    Packs/day: 6.00    Types: Cigars  . Smokeless tobacco: Never Used  Substance and Sexual Activity  . Alcohol use: Yes    Alcohol/week: 6.0 standard drinks    Types: 6 Cans of beer per week    Comment: 16  . Drug use: Yes    Types: Marijuana  . Sexual activity: Not on file  Other Topics Concern  . Not on file  Social History Narrative  . Not on file   Social Determinants of Health   Financial Resource  Strain:   . Difficulty of Paying Living Expenses: Not on file  Food Insecurity:   . Worried About Programme researcher, broadcasting/film/video in the Last Year: Not on file  . Ran Out of Food in the Last Year: Not on file  Transportation Needs:   . Lack of Transportation (Medical): Not on file  . Lack of Transportation (Non-Medical): Not on file  Physical Activity:   . Days of Exercise per Week: Not on file  . Minutes of Exercise per Session: Not on file  Stress:   . Feeling of Stress : Not on file  Social Connections:   . Frequency of Communication with Friends and Family: Not on file  . Frequency of Social Gatherings with Friends and Family: Not on file  . Attends Religious Services: Not on file  . Active Member of Clubs or Organizations: Not on file  . Attends Banker Meetings: Not on file  . Marital Status: Not on file   Additional Social History:    Allergies:  No Known Allergies  Labs:  Results for orders placed or performed during the hospital encounter of 05/30/19 (from the past 48 hour(s))  CBC with Differential     Status: None   Collection Time: 05/30/19  8:39 PM  Result Value Ref Range   WBC 7.6 4.0 - 10.5 K/uL   RBC 4.46 4.22 - 5.81 MIL/uL   Hemoglobin 14.0 13.0 - 17.0 g/dL   HCT 69.6 29.5 - 28.4 %   MCV 91.0 80.0 - 100.0 fL   MCH 31.4 26.0 - 34.0 pg   MCHC 34.5 30.0 - 36.0 g/dL   RDW 13.2 44.0 - 10.2 %   Platelets 239 150 - 400 K/uL   nRBC 0.0 0.0 - 0.2 %   Neutrophils Relative % 59 %   Neutro Abs 4.5 1.7 - 7.7 K/uL   Lymphocytes Relative 32 %   Lymphs Abs 2.4 0.7 - 4.0 K/uL   Monocytes Relative 7 %   Monocytes Absolute 0.5 0.1 - 1.0 K/uL   Eosinophils Relative 1 %   Eosinophils Absolute 0.1 0.0 - 0.5 K/uL   Basophils Relative 1 %   Basophils Absolute 0.1 0.0 - 0.1 K/uL   Immature Granulocytes 0 %   Abs Immature Granulocytes 0.03 0.00 - 0.07 K/uL    Comment: Performed at [facility], [address] [place], [place] [address]  Comprehensive metabolic  panel     Status: Abnormal   Collection Time: 05/30/19  8:39 PM  Result Value Ref Range   Sodium 142 135 - 145 mmol/L   Potassium 4.0 3.5 - 5.1 mmol/L   Chloride 107 98 - 111 mmol/L   CO2 20 (L) 22 - 32 mmol/L   Glucose, Bld 107 (H) 70 - 99 mg/dL   BUN 17 6 - 20 mg/dL   Creatinine, Ser 6.44 0.61 - 1.24 mg/dL  Calcium 9.1 8.9 - 10.3 mg/dL   Total Protein 8.4 (H) 6.5 - 8.1 g/dL   Albumin 4.6 3.5 - 5.0 g/dL   AST 62 (H) 15 - 41 U/L   ALT 80 (H) 0 - 44 U/L   Alkaline Phosphatase 88 38 - 126 U/L   Total Bilirubin 0.7 0.3 - 1.2 mg/dL   GFR calc non Af Amer >60 >60 mL/min   GFR calc Af Amer >60 >60 mL/min   Anion gap 15 5 - 15    Comment: Performed at [facility], [address] [place], [place] [address]  Protime-INR     Status: None   Collection Time: 05/30/19  8:39 PM  Result Value Ref Range   Prothrombin Time 13.3 11.4 - 15.2 seconds   INR 1.0 0.8 - 1.2    Comment: (NOTE) INR goal varies based on device and disease states. Performed at [facility], [address] [place], [place] [address]   Ethanol     Status: Abnormal   Collection Time: 05/30/19  8:39 PM  Result Value Ref Range   Alcohol, Ethyl (B) 181 (H) <10 mg/dL    Comment: (NOTE) Lowest detectable limit for serum alcohol is 10 mg/dL. For medical purposes only. Performed at [facility], [address] [place], [place] [address]   Salicylate level     Status: Abnormal   Collection Time: 05/30/19  8:39 PM  Result Value Ref Range   Salicylate Lvl <7.0 (L) 7.0 - 30.0 mg/dL    Comment: Performed at [facility], [address] [place], [place] [address]  Ammonia     Status: Abnormal   Collection Time: 05/30/19  8:47 PM  Result Value Ref Range   Ammonia 110 (H) 9 - 35 umol/L    Comment: Performed at [facility], [address] [place], [place] [address]  Respiratory Panel by RT PCR (Flu A&B, Covid) - Nasopharyngeal Swab     Status: None   Collection Time:  05/30/19  9:44 PM   Specimen: Nasopharyngeal Swab  Result Value Ref Range   SARS Coronavirus 2 by RT PCR NEGATIVE NEGATIVE    Comment: (NOTE) SARS-CoV-2 target nucleic acids are NOT DETECTED. The SARS-CoV-2 RNA is generally detectable in upper respiratoy specimens during the acute phase of infection. The lowest concentration of SARS-CoV-2 viral copies this assay can detect is 131 copies/mL. A negative result does not preclude SARS-Cov-2 infection and should not be used as the sole basis for treatment or other patient management decisions. A negative result may occur with  improper specimen collection/handling, submission of specimen other than nasopharyngeal swab, presence of viral mutation(s) within the areas targeted by this assay, and inadequate number of viral copies (<131 copies/mL). A negative result must be combined with clinical observations, patient history, and epidemiological information. The expected result is Negative. Fact Sheet for Patients:  https://www.moore.com/ Fact Sheet for Healthcare Providers:  https://www.young.biz/ This test is not yet ap proved or cleared by the [place] FDA and  has been authorized for detection and/or diagnosis of SARS-CoV-2 by FDA under an Emergency Use Authorization (EUA). This EUA will remain  in effect (meaning this test can be used) for the duration of the COVID-19 declaration under Section 564(b)(1) of the Act, 21 U.S.C. section 360bbb-3(b)(1), unless the authorization is terminated or revoked sooner.    Influenza A by PCR NEGATIVE NEGATIVE   Influenza B by PCR NEGATIVE NEGATIVE    Comment: (NOTE) The Xpert Xpress SARS-CoV-2/FLU/RSV  assay is intended as an aid in  the diagnosis of influenza from Nasopharyngeal swab specimens and  should not be used as a sole basis for treatment. Nasal washings and  aspirates are unacceptable for Xpert Xpress SARS-CoV-2/FLU/RSV  testing. Fact Sheet for  Patients: https://www.moore.com/ Fact Sheet for Healthcare Providers: https://www.young.biz/ This test is not yet approved or cleared by the [place] FDA and  has been authorized for detection and/or diagnosis of SARS-CoV-2 by  FDA under an Emergency Use Authorization (EUA). This EUA will remain  in effect (meaning this test can be used) for the duration of the  Covid-19 declaration under Section 564(b)(1) of the Act, 21  U.S.C. section 360bbb-3(b)(1), unless the authorization is  terminated or revoked. Performed at [facility], [address] [place], [place] [address]   POC SARS Coronavirus 2 Ag     Status: None   Collection Time: 05/30/19 10:05 PM  Result Value Ref Range   SARS Coronavirus 2 Ag NEGATIVE NEGATIVE    Comment: (NOTE) SARS-CoV-2 antigen NOT DETECTED.  Negative results are presumptive.  Negative results do not preclude SARS-CoV-2 infection and should not be used as the sole basis for treatment or other patient management decisions, including infection  control decisions, particularly in the presence of clinical signs and  symptoms consistent with COVID-19, or in those who have been in contact with the virus.  Negative results must be combined with clinical observations, patient history, and epidemiological information. The expected result is Negative. Fact Sheet for Patients: https://sanders-williams.net/ Fact Sheet for Healthcare Providers: https://martinez.com/ This test is not yet approved or cleared by the [place] FDA and  has been authorized for detection and/or diagnosis of SARS-CoV-2 by FDA under an Emergency Use Authorization (EUA).  This EUA will remain in effect (meaning this test can be used) for the duration of  the COVID-19 de claration under Section 564(b)(1) of the Act, 21 U.S.C. section 360bbb-3(b)(1), unless the authorization is terminated or revoked  sooner.   Urine Drug Screen, Qualitative ([facility] only)     Status: Abnormal   Collection Time: 05/30/19 10:18 PM  Result Value Ref Range   Tricyclic, Ur Screen NONE DETECTED NONE DETECTED   Amphetamines, Ur Screen NONE DETECTED NONE DETECTED   MDMA (Ecstasy)Ur Screen NONE DETECTED NONE DETECTED   Cocaine Metabolite,Ur [place] POSITIVE (A) NONE DETECTED   Opiate, Ur Screen NONE DETECTED NONE DETECTED   Phencyclidine (PCP) Ur S NONE DETECTED NONE DETECTED   Cannabinoid 50 Ng, Ur [place] POSITIVE (A) NONE DETECTED   Barbiturates, Ur Screen NONE DETECTED NONE DETECTED   Benzodiazepine, Ur Scrn NONE DETECTED NONE DETECTED   Methadone Scn, Ur NONE DETECTED NONE DETECTED    Comment: (NOTE) Tricyclics + metabolites, urine    Cutoff 1000 ng/mL Amphetamines + metabolites, urine  Cutoff 1000 ng/mL MDMA (Ecstasy), urine              Cutoff 500 ng/mL Cocaine Metabolite, urine          Cutoff 300 ng/mL Opiate + metabolites, urine        Cutoff 300 ng/mL Phencyclidine (PCP), urine         Cutoff 25 ng/mL Cannabinoid, urine                 Cutoff 50 ng/mL Barbiturates + metabolites, urine  Cutoff 200 ng/mL Benzodiazepine, urine              Cutoff 200 ng/mL Methadone, urine  Cutoff 300 ng/mL The urine drug screen provides only a preliminary, unconfirmed analytical test result and should not be used for non-medical purposes. Clinical consideration and professional judgment should be applied to any positive drug screen result due to possible interfering substances. A more specific alternate chemical method must be used in order to obtain a confirmed analytical result. Gas chromatography / mass spectrometry (GC/MS) is the preferred confirmat ory method. Performed at [facility], [address] [place], [place] [address]   Blood gas, arterial     Status: Abnormal   Collection Time: 05/30/19 11:18 PM  Result Value Ref Range   FIO2 0.40    Delivery systems VENTILATOR    Mode  ASSIST CONTROL    VT 500 mL   LHR 14 resp/min   Peep/cpap 5.0 cm H20   pH, Arterial 7.33 (L) 7.350 - 7.450   pCO2 arterial 41 32.0 - 48.0 mmHg   pO2, Arterial 205 (H) 83.0 - 108.0 mmHg   Bicarbonate 21.6 20.0 - 28.0 mmol/L   Acid-base deficit 4.1 (H) 0.0 - 2.0 mmol/L   O2 Saturation 99.7 %   Patient temperature 37.0    Collection site RIGHT RADIAL    Sample type ARTERIAL DRAW    Allens test (pass/fail) PASS PASS    Comment: Performed at [facility], [address] [place], [place] [address]  Glucose, capillary     Status: None   Collection Time: 05/31/19  3:32 AM  Result Value Ref Range   Glucose-Capillary 84 70 - 99 mg/dL  MRSA PCR Screening     Status: None   Collection Time: 05/31/19  3:41 AM   Specimen: Nasopharyngeal  Result Value Ref Range   MRSA by PCR NEGATIVE NEGATIVE    Comment:        The GeneXpert MRSA Assay (FDA approved for NASAL specimens only), is one component of a comprehensive MRSA colonization surveillance program. It is not intended to diagnose MRSA infection nor to guide or monitor treatment for MRSA infections. Performed at [facility], [address] [place], [place] [address]   CBC     Status: Abnormal   Collection Time: 05/31/19  4:50 AM  Result Value Ref Range   WBC 6.7 4.0 - 10.5 K/uL   RBC 3.93 (L) 4.22 - 5.81 MIL/uL   Hemoglobin 12.4 (L) 13.0 - 17.0 g/dL   HCT 86.535.5 (L) 78.439.0 - 69.652.0 %   MCV 90.3 80.0 - 100.0 fL   MCH 31.6 26.0 - 34.0 pg   MCHC 34.9 30.0 - 36.0 g/dL   RDW 29.514.5 28.411.5 - 13.215.5 %   Platelets 220 150 - 400 K/uL   nRBC 0.0 0.0 - 0.2 %    Comment: Performed at [facility], [address] [place], [place] [address]  Comprehensive metabolic panel     Status: Abnormal   Collection Time: 05/31/19  4:50 AM  Result Value Ref Range   Sodium 143 135 - 145 mmol/L   Potassium 3.7 3.5 - 5.1 mmol/L   Chloride 111 98 - 111 mmol/L   CO2 20 (L) 22 - 32 mmol/L   Glucose, Bld 80 70 - 99 mg/dL   BUN 21 (H)  6 - 20 mg/dL   Creatinine, Ser 2.720.74 0.61 - 1.24 mg/dL   Calcium 8.2 (L) 8.9 - 10.3 mg/dL   Total Protein 7.0 6.5 - 8.1 g/dL   Albumin 3.7 3.5 - 5.0 g/dL   AST 50 (H) 15 - 41 U/L  ALT 63 (H) 0 - 44 U/L   Alkaline Phosphatase 67 38 - 126 U/L   Total Bilirubin 0.7 0.3 - 1.2 mg/dL   GFR calc non Af Amer >60 >60 mL/min   GFR calc Af Amer >60 >60 mL/min   Anion gap 12 5 - 15    Comment: Performed at [facility], [address] [place], [place] [address]  Creatinine, serum     Status: None   Collection Time: 05/31/19  4:50 AM  Result Value Ref Range   Creatinine, Ser 0.71 0.61 - 1.24 mg/dL   GFR calc non Af Amer >60 >60 mL/min   GFR calc Af Amer >60 >60 mL/min    Comment: Performed at [facility], [address] [place], [place] [address]  Procalcitonin - Baseline     Status: None   Collection Time: 05/31/19  4:50 AM  Result Value Ref Range   Procalcitonin <0.10 ng/mL    Comment:        Interpretation: PCT (Procalcitonin) <= 0.5 ng/mL: Systemic infection (sepsis) is not likely. Local bacterial infection is possible. (NOTE)       Sepsis PCT Algorithm           Lower Respiratory Tract                                      Infection PCT Algorithm    ----------------------------     ----------------------------         PCT < 0.25 ng/mL                PCT < 0.10 ng/mL         Strongly encourage             Strongly discourage   discontinuation of antibiotics    initiation of antibiotics    ----------------------------     -----------------------------       PCT 0.25 - 0.50 ng/mL            PCT 0.10 - 0.25 ng/mL               OR       >80% decrease in PCT            Discourage initiation of                                            antibiotics      Encourage discontinuation           of antibiotics    ----------------------------     -----------------------------         PCT >= 0.50 ng/mL              PCT 0.26 - 0.50 ng/mL               AND        <80%  decrease in PCT             Encourage initiation of                                             antibiotics       Encourage continuation  of antibiotics    ----------------------------     -----------------------------        PCT >= 0.50 ng/mL                  PCT > 0.50 ng/mL               AND         increase in PCT                  Strongly encourage                                      initiation of antibiotics    Strongly encourage escalation           of antibiotics                                     -----------------------------                                           PCT <= 0.25 ng/mL                                                 OR                                        > 80% decrease in PCT                                     Discontinue / Do not initiate                                             antibiotics Performed at [facility], [address] [place], [place] [address]   Brain natriuretic peptide     Status: None   Collection Time: 05/31/19  4:50 AM  Result Value Ref Range   B Natriuretic Peptide 15.0 0.0 - 100.0 pg/mL    Comment: Performed at [facility], [address] [place], [place] [address]  Basic metabolic panel     Status: Abnormal   Collection Time: 06/01/19  4:40 AM  Result Value Ref Range   Sodium 142 135 - 145 mmol/L   Potassium 3.3 (L) 3.5 - 5.1 mmol/L   Chloride 110 98 - 111 mmol/L   CO2 22 22 - 32 mmol/L   Glucose, Bld 95 70 - 99 mg/dL   BUN 23 (H) 6 - 20 mg/dL   Creatinine, Ser 8.29 0.61 - 1.24 mg/dL   Calcium 8.7 (L) 8.9 - 10.3 mg/dL   GFR calc non Af Amer >60 >60 mL/min   GFR calc Af Amer >60 >60 mL/min   Anion gap 10 5 - 15    Comment: Performed at [facility], [address] [place], [place] [address]  CBC     Status: Abnormal   Collection Time: 06/01/19  4:40 AM  Result Value Ref Range   WBC 10.4 4.0 - 10.5 K/uL   RBC 3.91 (L) 4.22 - 5.81 MIL/uL   Hemoglobin 12.2 (L) 13.0 - 17.0 g/dL    HCT 36.2 (L) 39.0 - 52.0 %   MCV 92.6 80.0 - 100.0 fL   MCH 31.2 26.0 - 34.0 pg   MCHC 33.7 30.0 - 36.0 g/dL   RDW 13.3 11.5 - 15.5 %   Platelets 181 150 - 400 K/uL   nRBC 0.0 0.0 - 0.2 %    Comment: Performed at [facility], [address] [place], [place] [address]  Ammonia     Status: None   Collection Time: 06/01/19  4:40 AM  Result Value Ref Range   Ammonia 34 9 - 35 umol/L    Comment: Performed at [facility], [address], [place], [place] [address]    Current Facility-Administered Medications  Medication Dose Route Frequency Provider Last Rate Last Admin  . acetaminophen (TYLENOL) tablet 650 mg  650 mg Oral Q4H PRN Bradly Bienenstock, NP      . Chlorhexidine Gluconate Cloth 2 % PADS 6 each  6 each Topical Q0600 Bradly Bienenstock, NP   6 each at 06/01/19 [phone]  . folic acid (FOLVITE) tablet 1 mg  1 mg Oral Daily Darel Hong D, NP   1 mg at 06/01/19 0953  . heparin injection 5,000 Units  5,000 Units Subcutaneous Q8H Darel Hong D, NP   5,000 Units at 06/01/19 [phone]  . ipratropium-albuterol (DUONEB) 0.5-2.5 (3) MG/3ML nebulizer solution 3 mL  3 mL Nebulization Q4H PRN Darel Hong D, NP      . lactulose (CHRONULAC) 10 GM/15ML solution 10 g  10 g Oral BID PRN Bradly Bienenstock, NP      . LORazepam (ATIVAN) injection 1-2 mg  1-2 mg Intravenous Q1H PRN Ottie Glazier, MD      . multivitamin with minerals tablet 1 tablet  1 tablet Oral Daily Darel Hong D, NP   1 tablet at 06/01/19 [phone]  . ondansetron (ZOFRAN) injection 4 mg  4 mg Intravenous Q6H PRN Darel Hong D, NP      . pantoprazole (PROTONIX) injection 40 mg  40 mg Intravenous QHS Darel Hong D, NP   40 mg at 05/31/19 2139  . rifaximin (XIFAXAN) tablet 200 mg  200 mg Oral BID Darel Hong D, NP   200 mg at 06/01/19 0953  . thiamine tablet 100 mg  100 mg Oral Daily Darel Hong D, NP   100 mg at 06/01/19 4268   Or  . thiamine (B-1) injection 100 mg  100 mg Intravenous Daily  Bradly Bienenstock, NP        Musculoskeletal: Strength & Muscle Tone: within normal limits Gait & Station: normal Patient leans: N/A  Psychiatric Specialty Exam: Physical Exam  Review of Systems  Blood pressure (!) 135/91, pulse 67, temperature 99.7 F (37.6 C), temperature source Bladder, resp. rate 15, height 5\' 10"  (1.778 m), weight 69.9 kg, SpO2 98 %.Body mass index is 22.11 kg/m.  General Appearance: Fairly Groomed and wearing hospital gown  Eye Contact:  Fair  Speech:  Clear and Coherent and Normal Rate  Volume:  Normal  Mood:  Depressed  Affect:  Depressed  Thought Process:  Coherent, Linear and Descriptions of Associations: Intact  Orientation:  Full (Time, Place, and  Person)  Thought Content:  Logical  Suicidal Thoughts:  No  Homicidal Thoughts:  No  Memory:  Immediate;   Fair Recent;   Fair  Judgement:  Fair  Insight:  Fair  Psychomotor Activity:  Normal  Concentration:  Concentration: Fair and Attention Span: Good  Recall:  Good  Fund of Knowledge:  Good  Language:  Good  Akathisia:  No  Handed:  Right  AIMS (if indicated):     Assets:  Communication Skills Desire for Improvement Financial Resources/Insurance Housing Leisure Time Physical Health Social Support Talents/Skills Transportation Vocational/Educational  ADL's:  Intact  Cognition:  WNL  Sleep:        Treatment Plan Summary: Medication management and Plan Continue current medications. Patient does not meet criteria for inpatient admission as he denies si/hi/avh. However he is interested in outpatient therapy and or short term residential facitlity. He is aware that it is a holiday and most of these facilities are closed. Will consult social work to assist with outpatient resources for substance/alcohol use.   Disposition: No evidence of imminent risk to self or others at present.   Patient does not meet criteria for psychiatric inpatient admission. Supportive therapy provided about ongoing  stressors. Refer to IOP.  Maryagnes Amos, FNP 06/01/2019 11:15 AM

## 2019-06-01 NOTE — Progress Notes (Signed)
PULMONARY / CRITICAL CARE MEDICINE    Name: Joel HEFFLEY Sr.  MRN: [id number]  DOB: [birth year]  ADMISSION DATE: 05/30/2019  CONSULTATION DATE: 05/30/2019  REFERRING MD: Dr. Quentin Cornwall  CHIEF COMPLAINT: Seizure-like activity, alcohol intoxication    BRIEF DISCUSSION:  49 year old male with past medical history notable for seizures on no antiepileptics, ETOH abuse, and Hepatitis C who presents 05/30/2019 due to seizure-like activity, alcohol intoxication, and hepatic encephalopathy. While in the ED, had decreased level of consciousness requiring intubation for airway protection. Urine drug screen is positive for cocaine and cannabinoid.  HISTORY OF PRESENT ILLNESS:  Joel Tanner is a 49 year old male with a past medical history notable for alcohol abuse, hepatitis C, elevated transaminase, seizures, and tobacco abuse who presents to [facility] ED on 05/30/2019 with seizure-like activity. Patiently is currently intubated and sedated with no family present, therefore history is obtained from ED and nursing notes. Per notes has exhibited the seizure-like episodes after he has been drinking. He is currently not on any antiepileptic medications as an outpatient. He was also recently admitted approximately 1 month ago for similar symptoms and found to have hepatic encephalopathy. Initial work-up in the ED revealed ammonia 115, ethyl alcohol 563, salicylates less than 7, bicarb 20, AST 62, ALT 80. His COVID-19 and influenza PCR's are both negative. CT head negative for any acute intracranial abnormality. Chest x-ray is negative for any acute disease process. Urine drug screen is positive for cocaine and cannabinoid.  While in the ED he became increasingly somnolent, with decrease in his GCS from 10 to 6. He was subsequently intubated for airway protection. PCCM is asked to admit the patient to ICU for further work-up and treatment of alcohol intoxication and hepatic encephalopathy requiring intubation for airway  protection.  PAST MEDICAL HISTORY :  He has a past medical history of Chronic alcohol abuse, Elevated transaminase level, Hepatitis C, Seizure ([place]), and Tobacco use.  PAST SURGICAL HISTORY:  He has a past surgical history that includes Carpal tunnel release.  No Known Allergies  No current facility-administered medications on file prior to encounter.       Current Outpatient Medications on File Prior to Encounter  Medication Sig  . atorvastatin (LIPITOR) 40 MG tablet Take 1 tablet (40 mg total) by mouth daily at 6 PM.  . B Complex-Biotin-FA (B-100 B-COMPLEX PO) Take 1 tablet by mouth daily.  Marland Kitchen disulfiram (ANTABUSE) 250 MG tablet Take 250 mg by mouth daily.  . folic acid (FOLVITE) 1 MG tablet Take 1 tablet (1 mg total) by mouth daily.  Marland Kitchen lactulose (CHRONULAC) 10 GM/15ML solution Take 15 mLs (10 g total) by mouth 2 (two) times daily as needed for mild constipation (And confusion).  . tadalafil (CIALIS) 20 MG tablet Take 20 mg by mouth daily as needed for erectile dysfunction.  Marland Kitchen venlafaxine XR (EFFEXOR-XR) 150 MG 24 hr capsule Take 150 mg by mouth daily.  . Multiple Vitamins-Minerals (MULTIVITAMIN WITH MINERALS) tablet Take 1 tablet by mouth daily. (Patient not taking: Reported on 05/30/2019)  . omeprazole (PRILOSEC) 20 MG capsule Take 20 mg by mouth daily as needed.  . thiamine 100 MG tablet Take 1 tablet (100 mg total) by mouth daily. (Patient not taking: Reported on 05/30/2019)   FAMILY HISTORY:  His has no family status information on file.   SOCIAL HISTORY:  He reports that he has been smoking cigars. He has been smoking about 6.00 packs per day. He has never used smokeless tobacco. He reports current alcohol use of  about 6.0 standard drinks of alcohol per week. He reports current drug use. Drug: Marijuana.  Physical Exam  Constitutional: He is oriented to person, place, and time. No distress.  HENT:  Head: Normocephalic.  Eyes: EOM are normal.  Cardiovascular: Normal rate and  regular rhythm.  Pulmonary/Chest: No stridor. No respiratory distress. He exhibits no tenderness.  Abdominal: Soft. Bowel sounds are normal.  Musculoskeletal:  General: No tenderness or edema. Normal range of motion.  Cervical back: Normal range of motion.  Neurological: He is alert and oriented to person, place, and time. GCS score is 15.  Skin: Skin is warm and dry. He is not diaphoretic.  Psychiatric: Affect normal.   REVIEW OF SYSTEMS:  Unable to assess due to critical illness and intubation  SUBJECTIVE:  Unable to assess due to critical illness and intubation  VITAL SIGNS:  BP (!) 135/91  Pulse 67  Temp 99.7 F (37.6 C) (Bladder)  Resp 15  Ht _0  (1.778 m)  Wt 69.9 kg  SpO2 98%  BMI 22.11 kg/m  HEMODYNAMICS:   VENTILATOR SETTINGS:  >    INTAKE / OUTPUT:  I/O last 3 completed shifts:  In: 2414 [I.V.:1914; IV Piggyback:500]  Out: 66 [Urine:580]  PHYSICAL EXAMINATION:  General: Acutely ill-appearing male, laying in bed, intubated and sedated, no acute distress  Neuro: Sedated, withdraws from pain, pupils PERRLA  HEENT: Atraumatic, normocephalic, neck supple, no JVD  Cardiovascular: Regular rate and rhythm, S1-S2 noted, no murmurs, rubs or gallops. 2+ pulses throughout  Lungs: Clear to auscultation bilaterally, no wheezing, even, vent assisted  Abdomen: Soft, nontender, nondistended, no guarding or rebound tenderness, bowel sounds positive x4  Musculoskeletal: No deformities, normal bulk and tone, no edema  Skin: Warm and dry, no obvious rashes, lesions, or ulcerations  LABS:  BMET  Last Labs                                                            Electrolytes  Last Labs                        CBC  Last Labs                                          Coag's  Last Labs                  Sepsis Markers  Last Labs                  ABG  Last Labs                          Liver Enzymes  Last Labs                                          Cardiac Enzymes  Last Labs     Glucose  Last Labs                  Imaging  EEG  Result Date: 05/31/2019  Joel Tanner,  Magda Paganini, MD 05/31/2019 7:17 PM ELECTROENCEPHALOGRAM REPORT Patient: Joel Lunger Sr. Room #: IC05A-AA EEG No. ID: [id number] Age: 49 y.o. Sex: male Referring Physician: Lanney Gins Report Date: 05/31/2019 Interpreting Physician: Alexis Goodell History: Joel Lunger Sr. is an 49 y.o. male presenting with seizure like activity Medications: Fentanyl, Precedex, Thiamine Conditions of Recording: This is a 21 channel routine scalp EEG performed with bipolar and monopolar montages arranged in accordance to the international 10/20 system of electrode placement. One channel was dedicated to EKG recording. The patient is in the awake state. Description: The background activity is slow and poorly organized. It consists of a low voltage to moderate voltage polymorphic delta activity that is diffusely distributed. This activity is continuous throughout the recording. No epileptiform activity is noted. Hyperventilation and intermittent photic stimulation were not performed. IMPRESSION: This is an abnormal EEG secondary to general background slowing. This finding may be seen with a diffuse disturbance that is etiologically nonspecific, but may include a metabolic encephalopathy, among other possibilities. No epileptiform activity is noted. Alexis Goodell, MD Neurology [phone] 05/31/2019, 7:11 PM   STUDIES:  CT Head w/o contrast 12/30>> 1. No acute intracranial abnormality. 2. There is mucosal thickening of the ethmoid air cells  and right frontal sinus. The remaining paranasal sinuses and mastoid  air cells are essentially clear.  DG Wrist 12/30>> 1. No acute displaced fracture is seen.  2. Suspected chronic deformity at the distal radius. Lateral view  suggests mild dorsal subluxation of the proximal carpal bones with  respect to the distal radius with widened appearance  of the dorsal  joint space.  CULTURES:  COVID-19 AG 12/30>> negative  SARS-CoV-2 PCR 12/30>> negative  Influenza PCR 12/30>> negative   ANTIBIOTICS:  N/A  SIGNIFICANT EVENTS:  12/30>> presented to ED  12/30>> became very somnolent requiring intubation for airway protection  LINES/TUBES:  ET tube 12/30>>  ASSESSMENT / PLAN:  PULMONARY  A:  Intubated for airway protection in setting of alcohol intoxication and hepatic encephalopathy  P:  Full vent support  Wean FiO2 and PEEP as tolerated to maintain O2 saturations greater than 92%  Follow intermittent ABG and chest x-ray as needed  As needed bronchodilators  Spontaneous breathing trials when respiratory parameters met and mental status permits  CARDIOVASCULAR  A:  ? Developing pulmonary edema  P:  Continuous cardiac monitoring  Maintain MAP greater than 65  Cautious IV fluids  Levophed if needed to maintain map goal  Obtain 2D echocardiogram  Check BNP  RENAL  A:  No acute issues  P:  Monitor I&O's / urinary output Follow BMP  Ensure adequate renal perfusion  Avoid nephrotoxic agents as able Replace electrolytes as indicated Gentle IV Fluids  GASTROINTESTINAL  A:  Hyperammonemia  Hx: Hepatitis C  P:  N.p.o. for now  Protonix for Stress ulcer Prophylaxis  Follow LFTs and ammonia  Continue home lactulose  Place on rifaximin  HEMATOLOGIC  A:  No acute issues  P:  Monitor for S/Sx of bleeding  Trend CBC  Subcu heparin for VTE Prophylaxis  Transfuse for Hgb <7  INFECTIOUS  A:  No acute issues  P:  Monitor fever curve  Trend WBCs  Check procalcitonin to assess for possible aspiration with somnolence  ENDOCRINE  A:  No acute issues  P:  CBGs  Sliding-scale insulin if needed  Follow ICU hypo/hyperglycemia protocol  NEUROLOGIC  A:  Acute metabolic encephalopathy in the setting of alcohol intoxication and hepatic encephalopathy  Urine drug screen positive  for cocaine and cannabis  Seizure-like  activity, likely in setting of ETOH intoxication  P:  RASS goal: -1 to -2  Propofol and Fentanyl drips to maintain RASS goal  Avoid sedating meds as able  Daily WUA  Provide supportive care  Follow Ammonia  Continue Lactulose and Rifaximin  Monitor for s/sx of ETOH withdrawal  Place on Thiamine, Folic acid, MVI  Encourage substance abuse cessation  Obtain EEG  Prn Versed for additional seizures  Will hold off on antiepileptic for now  Consider Neurology consult  FAMILY  - Updates: No family present at bedside during NP rounds. Pt is a Full Code.  - Inter-disciplinary family meet or Palliative Care meeting due by: 06/05/2018     Critical care provider statement:  Critical care time (minutes): 32 Critical care time was exclusive of: Separately billable procedures and  treating other patients Critical care was necessary to treat or prevent imminent or  life-threatening deterioration of the following conditions: tonic clonic seizures, hx of substance abuse, multiple comorbid conditions Critical care was time spent personally by me on the following  activities: Development of treatment plan with patient or surrogate,  discussions with consultants, evaluation of patient's response to  treatment, examination of patient, obtaining history from patient or  surrogate, ordering and performing treatments and interventions, ordering  and review of laboratory studies and re-evaluation of patient's condition I assumed direction of critical care for this patient from another  provider in my specialty: no     Ottie Glazier, M.D.  Pulmonary & [place]

## 2019-06-02 DIAGNOSIS — F10929 Alcohol use, unspecified with intoxication, unspecified: Secondary | ICD-10-CM

## 2019-06-02 DIAGNOSIS — E876 Hypokalemia: Secondary | ICD-10-CM

## 2019-06-02 LAB — URINALYSIS, ROUTINE W REFLEX MICROSCOPIC
Bilirubin Urine: NEGATIVE
Glucose, UA: NEGATIVE mg/dL
Hgb urine dipstick: NEGATIVE
Ketones, ur: NEGATIVE mg/dL
Leukocytes,Ua: NEGATIVE
Nitrite: NEGATIVE
Protein, ur: NEGATIVE mg/dL
Specific Gravity, Urine: 1.023 (ref 1.005–1.030)
pH: 6 (ref 5.0–8.0)

## 2019-06-02 MED ORDER — SODIUM CHLORIDE 0.9% FLUSH
3.0000 mL | INTRAVENOUS | Status: DC | PRN
  Administered 2019-06-02 – 2019-06-03 (×4): 3 mL via INTRAVENOUS

## 2019-06-02 MED ORDER — TRAMADOL HCL 50 MG PO TABS
50.0000 mg | ORAL_TABLET | Freq: Four times a day (QID) | ORAL | Status: DC | PRN
  Administered 2019-06-02 (×2): 50 mg via ORAL
  Filled 2019-06-02 (×2): qty 1

## 2019-06-02 MED ORDER — POTASSIUM CHLORIDE CRYS ER 20 MEQ PO TBCR
40.0000 meq | EXTENDED_RELEASE_TABLET | Freq: Once | ORAL | Status: AC
  Administered 2019-06-02: 40 meq via ORAL
  Filled 2019-06-02: qty 2

## 2019-06-02 MED ORDER — SODIUM CHLORIDE 0.9% FLUSH
3.0000 mL | Freq: Two times a day (BID) | INTRAVENOUS | Status: DC
  Administered 2019-06-02 – 2019-06-03 (×3): 3 mL via INTRAVENOUS

## 2019-06-02 NOTE — Progress Notes (Signed)
Chief c/o is chronic/acute rt wrist pain related to old injury- initiated refused pain med and later accepted tramadol with pain controlled; noted deformity of position which is old with moderate edema. MD and PT aware. CIWA negative. TC from wife verbalizing desire for pt to be discharged directly to inpatient substance rehab center with [facility] consult update submitted with pt aware. Ambulated in hall and tolerated well; up in chair in room and tolerated well.

## 2019-06-02 NOTE — Progress Notes (Signed)
PROGRESS NOTE    [name]  EXB:[id number] DOB: 01-Feb-1971 DOA: 05/30/2019 PCP: Dortha Kern, MD      Assessment & Plan:   Active Problems:   Seizures (HCC)  Alcoholic intoxication: s/p intubation, ventilation & extubation. Continue on ativan. Continue on thiamine, folic acid, & multivitamin. Alcohol cessation counseling. Pt is interested in programs for alcoholic pts, messaged CM about this   Hepatic encephalopathy: improving. Secondary to alcohol abuse. Continue on home dose of lactulose & rifaximin.   Hypokalemia: KCl repleated. Will continue to monitor   Acute hypoxic respiratory failure: resolved  Hx of HCV: management per outpatient GI  Transaminitis: secondary to alcohol abuse. Will continue to monitor   Right wrist pain & swelling: no fracture as per XR. Tramadol prn. Previous remote hx of trauma to right wrist. Recommend pt see an ortho surg as an outpatient  DVT prophylaxis: heparin Code Status: full  Family Communication:  Disposition Plan:.   Consultants:   ICU   Procedures: s/p intubation & extubation    Antimicrobials: n/a   Subjective: Pt c/o right wrist pain   Objective: Vitals:   06/01/19 1500 06/01/19 1803 06/02/19 0000 06/02/19 0658  BP: 134/72 139/78 136/78 128/78  Pulse: 80 74 80 66  Resp: 16 17 18 18   Temp:  98.6 F (37 C) 98.9 F (37.2 C) 98.6 F (37 C)  TempSrc:   Oral Oral  SpO2: 97% 97% 99% 99%  Weight:      Height:        Intake/Output Summary (Last 24 hours) at 06/02/2019 0718 Last data filed at 06/02/2019 0656 Gross per 24 hour  Intake 385.84 ml  Output 1100 ml  Net -714.16 ml   Filed Weights   05/30/19 2035 05/31/19 0335 06/01/19 0500  Weight: 63 kg 67.2 kg 69.9 kg    Examination:  General exam: Appears calm and comfortable  Respiratory system: Clear to auscultation. Respiratory effort normal. Cardiovascular system: S1 & S2+. No , rubs, gallops or clicks. No pedal edema. Gastrointestinal system:  Abdomen is nondistended, soft and nontender. Normal bowel sounds heard. Central nervous system: Alert and oriented. Moves all 4 extremities Psychiatry: Judgement and insight appear normal. Mood & affect appropriate.     Data Reviewed: I have personally reviewed following labs and imaging studies  CBC: Recent Labs  Lab 05/30/19 2039 05/31/19 0450 06/01/19 0440  WBC 7.6 6.7 10.4  NEUTROABS 4.5  --   --   HGB 14.0 12.4* 12.2*  HCT 40.6 35.5* 36.2*  MCV 91.0 90.3 92.6  PLT 239 220 181   Basic Metabolic Panel: Recent Labs  Lab 05/30/19 2039 05/31/19 0450 06/01/19 0440  NA 142 143 142  K 4.0 3.7 3.3*  CL 107 111 110  CO2 20* 20* 22  GLUCOSE 107* 80 95  BUN 17 21* 23*  CREATININE 0.73 0.74  0.71 0.67  CALCIUM 9.1 8.2* 8.7*   GFR: Estimated Creatinine Clearance: 109.2 mL/min (by C-G formula based on SCr of 0.67 mg/dL). Liver Function Tests: Recent Labs  Lab 05/30/19 2039 05/31/19 0450  AST 62* 50*  ALT 80* 63*  ALKPHOS 88 67  BILITOT 0.7 0.7  PROT 8.4* 7.0  ALBUMIN 4.6 3.7   No results for input(s): LIPASE, AMYLASE in the last 168 hours. Recent Labs  Lab 05/30/19 2047 06/01/19 0440  AMMONIA 110* 34   Coagulation Profile: Recent Labs  Lab 05/30/19 2039  INR 1.0   Cardiac Enzymes: No results for input(s): CKTOTAL, CKMB, CKMBINDEX,  TROPONINI in the last 168 hours. BNP (last 3 results) No results for input(s): PROBNP in the last 8760 hours. HbA1C: No results for input(s): HGBA1C in the last 72 hours. CBG: Recent Labs  Lab 05/31/19 0332  GLUCAP 84   Lipid Profile: No results for input(s): CHOL, HDL, LDLCALC, TRIG, CHOLHDL, LDLDIRECT in the last 72 hours. Thyroid Function Tests: No results for input(s): TSH, T4TOTAL, FREET4, T3FREE, THYROIDAB in the last 72 hours. Anemia Panel: No results for input(s): VITAMINB12, FOLATE, FERRITIN, TIBC, IRON, RETICCTPCT in the last 72 hours. Sepsis Labs: Recent Labs  Lab 05/31/19 0450  PROCALCITON <0.10     Recent Results (from the past 240 hour(s))  Respiratory Panel by RT PCR (Flu A&B, Covid) - Nasopharyngeal Swab     Status: None   Collection Time: 05/30/19  9:44 PM   Specimen: Nasopharyngeal Swab  Result Value Ref Range Status   SARS Coronavirus 2 by RT PCR NEGATIVE NEGATIVE Final    Comment: (NOTE) SARS-CoV-2 target nucleic acids are NOT DETECTED. The SARS-CoV-2 RNA is generally detectable in upper respiratoy specimens during the acute phase of infection. The lowest concentration of SARS-CoV-2 viral copies this assay can detect is 131 copies/mL. A negative result does not preclude SARS-Cov-2 infection and should not be used as the sole basis for treatment or other patient management decisions. A negative result may occur with  improper specimen collection/handling, submission of specimen other than nasopharyngeal swab, presence of viral mutation(s) within the areas targeted by this assay, and inadequate number of viral copies (<131 copies/mL). A negative result must be combined with clinical observations, patient history, and epidemiological information. The expected result is Negative. Fact Sheet for Patients:  https://www.moore.com/ Fact Sheet for Healthcare Providers:  https://www.young.biz/ This test is not yet ap proved or cleared by the [place] FDA and  has been authorized for detection and/or diagnosis of SARS-CoV-2 by FDA under an Emergency Use Authorization (EUA). This EUA will remain  in effect (meaning this test can be used) for the duration of the COVID-19 declaration under Section 564(b)(1) of the Act, 21 U.S.C. section 360bbb-3(b)(1), unless the authorization is terminated or revoked sooner.    Influenza A by PCR NEGATIVE NEGATIVE Final   Influenza B by PCR NEGATIVE NEGATIVE Final    Comment: (NOTE) The Xpert Xpress SARS-CoV-2/FLU/RSV assay is intended as an aid in  the diagnosis of influenza from Nasopharyngeal  swab specimens and  should not be used as a sole basis for treatment. Nasal washings and  aspirates are unacceptable for Xpert Xpress SARS-CoV-2/FLU/RSV  testing. Fact Sheet for Patients: https://www.moore.com/ Fact Sheet for Healthcare Providers: https://www.young.biz/ This test is not yet approved or cleared by the [place] FDA and  has been authorized for detection and/or diagnosis of SARS-CoV-2 by  FDA under an Emergency Use Authorization (EUA). This EUA will remain  in effect (meaning this test can be used) for the duration of the  Covid-19 declaration under Section 564(b)(1) of the Act, 21  U.S.C. section 360bbb-3(b)(1), unless the authorization is  terminated or revoked. Performed at [facility], [address] [place], [place] [address]   MRSA PCR Screening     Status: None   Collection Time: 05/31/19  3:41 AM   Specimen: Nasopharyngeal  Result Value Ref Range Status   MRSA by PCR NEGATIVE NEGATIVE Final    Comment:        The GeneXpert MRSA Assay (FDA approved for NASAL specimens only), is one component of a comprehensive MRSA  colonization surveillance program. It is not intended to diagnose MRSA infection nor to guide or monitor treatment for MRSA infections. Performed at [facility], [address] [place], [place] [address]          Radiology Studies: EEG  Result Date: 05/31/2019 Alexis Goodell, MD     05/31/2019  7:17 PM ELECTROENCEPHALOGRAM REPORT Patient: Joel Lunger Sr.       Room #: IC05A-AA EEG No. ID: [id number] Age: 49 y.o.        Sex: male Referring Physician: Lanney Gins Report Date:  05/31/2019       Interpreting Physician: Alexis Goodell History: Joel Lunger Sr. is an 49 y.o. male presenting with seizure like activity Medications: Fentanyl, Precedex, Thiamine Conditions of Recording:  This is a 21 channel routine scalp EEG performed with bipolar and monopolar montages arranged  in accordance to the international 10/20 system of electrode placement. One channel was dedicated to EKG recording. The patient is in the awake state. Description:  The background activity is slow and poorly organized.  It consists of a low voltage to moderate voltage polymorphic delta activity that is diffusely distributed.  This activity is continuous throughout the recording.  No epileptiform activity is noted.  Hyperventilation and intermittent photic stimulation were not performed. IMPRESSION: This is an abnormal EEG secondary to general background slowing.  This finding may be seen with a diffuse disturbance that is etiologically nonspecific, but may include a metabolic encephalopathy, among other possibilities.  No epileptiform activity is noted.  Alexis Goodell, MD Neurology [phone] 05/31/2019, 7:11 PM        Scheduled Meds: . Chlorhexidine Gluconate Cloth  6 each Topical Q0600  . folic acid  1 mg Oral Daily  . heparin  5,000 Units Subcutaneous Q8H  . multivitamin with minerals  1 tablet Oral Daily  . pantoprazole (PROTONIX) IV  40 mg Intravenous QHS  . rifaximin  200 mg Oral BID  . thiamine  100 mg Oral Daily   Or  . thiamine  100 mg Intravenous Daily   Continuous Infusions:   LOS: 3 days    Time spent: 32 mins    Wyvonnia Dusky, MD [facility] Pager [phone]  If 7PM-7AM, please contact night-coverage [email or web address] Password [facility] 06/02/2019, 7:18 AM

## 2019-06-02 NOTE — Evaluation (Signed)
Physical Therapy Evaluation Patient Details Name: Joel HAMOR Sr. MRN: [id number] DOB: 03-Mar-1971 Today's Date: 06/02/2019   History of Present Illness  Per MD H&P: Pt is a 49 year old male with past medical history notable for seizures on no antiepileptics, ETOH abuse, and Hepatitis C who presents 05/30/2019 due to seizure-like activity, alcohol intoxication, and hepatic encephalopathy.  While in the ED, had decreased level of consciousness requiring intubation for airway protection.  Urine drug screen is positive for cocaine and cannabinoid.    Clinical Impression  Pt presented with no noted deficits in strength, transfers, mobility, gait, balance, or activity tolerance.  Pt Ind with all functional tasks, steady with amb without an AD, and able to ascend/descend 10 steps with one rail with excellent control and stability.  Pt able to perform SLS on each LE > 10 sec with very good stability and reported no adverse symptoms during the session.  Pt reports feeling close to baseline other than some general fatigue and will not require skilled PT services at this time.  Will complete PT orders at this time but will reassess pt pending a change in status upon receipt of new PT orders.       Follow Up Recommendations No PT follow up    Equipment Recommendations  None recommended by PT    Recommendations for Other Services       Precautions / Restrictions Precautions Precautions: Fall Restrictions Weight Bearing Restrictions: No      Mobility  Bed Mobility Overal bed mobility: Independent                Transfers Overall transfer level: Independent                  Ambulation/Gait Ambulation/Gait assistance: Independent Gait Distance (Feet): 200 Feet Assistive device: None Gait Pattern/deviations: WFL(Within Functional Limits)   Gait velocity interpretation: >4.37 ft/sec, indicative of normal walking speed General Gait Details: Good stability and cadence including  during start/stops and rapid change of direction  Stairs Stairs: Yes Stairs assistance: Modified independent (Device/Increase time) Stair Management: One rail Left;Alternating pattern;Forwards Number of Stairs: 10 General stair comments: Good eccentric and concentric control with alternating pattern  Wheelchair Mobility    Modified Rankin (Stroke Patients Only)       Balance Overall balance assessment: Independent(Pt able to perform SLS on each LE > 10 sec and was steady with feet together and eyes closed)                                           Pertinent Vitals/Pain Pain Assessment: 0-10 Pain Score: 5  Pain Location: R hand Pain Descriptors / Indicators: Sore Pain Intervention(s): Monitored during session    Home Living Family/patient expects to be discharged to:: Private residence Living Arrangements: Spouse/significant other Available Help at Discharge: Family Type of Home: House Home Access: Stairs to enter Entrance Stairs-Rails: Right;Left;Can reach both Secretary/administrator of Steps: 3 Home Layout: One level Home Equipment: Grab bars - tub/shower;Walker - 2 wheels      Prior Function Level of Independence: Independent         Comments: Patient works as a Nutritional therapist, reports independence with all mobility/ ADL's, 2 falls in the last year     Hand Dominance        Extremity/Trunk Assessment   Upper Extremity Assessment Upper Extremity Assessment: Overall WFL for tasks assessed  Lower Extremity Assessment Lower Extremity Assessment: Overall WFL for tasks assessed    Cervical / Trunk Assessment Cervical / Trunk Assessment: Normal  Communication   Communication: No difficulties  Cognition Arousal/Alertness: Awake/alert Behavior During Therapy: WFL for tasks assessed/performed Overall Cognitive Status: Within Functional Limits for tasks assessed                                        General Comments       Exercises     Assessment/Plan    PT Assessment Patent does not need any further PT services  PT Problem List         PT Treatment Interventions      PT Goals (Current goals can be found in the Care Plan section)  Acute Rehab PT Goals PT Goal Formulation: All assessment and education complete, DC therapy    Frequency     Barriers to discharge        Co-evaluation               AM-PAC PT "6 Clicks" Mobility  Outcome Measure Help needed turning from your back to your side while in a flat bed without using bedrails?: None Help needed moving from lying on your back to sitting on the side of a flat bed without using bedrails?: None Help needed moving to and from a bed to a chair (including a wheelchair)?: None Help needed standing up from a chair using your arms (e.g., wheelchair or bedside chair)?: None Help needed to walk in hospital room?: None Help needed climbing 3-5 steps with a railing? : None 6 Click Score: 24    End of Session Equipment Utilized During Treatment: Gait belt Activity Tolerance: Patient tolerated treatment well Patient left: in chair;with call bell/phone within reach;with chair alarm set Nurse Communication: Mobility status PT Visit Diagnosis: Difficulty in walking, not elsewhere classified (R26.2);Muscle weakness (generalized) (M62.81)    Time: 1008-1030 PT Time Calculation (min) (ACUTE ONLY): 22 min   Charges:   PT Evaluation $PT Eval Low Complexity: 1 Low          D. Scott Joanne Brander PT, DPT 06/02/19, 10:52 AM

## 2019-06-03 LAB — COMPREHENSIVE METABOLIC PANEL WITH GFR
ALT: 40 U/L (ref 0–44)
AST: 43 U/L — ABNORMAL HIGH (ref 15–41)
Albumin: 3.6 g/dL (ref 3.5–5.0)
Alkaline Phosphatase: 99 U/L (ref 38–126)
Anion gap: 10 (ref 5–15)
BUN: 14 mg/dL (ref 6–20)
CO2: 23 mmol/L (ref 22–32)
Calcium: 8.9 mg/dL (ref 8.9–10.3)
Chloride: 103 mmol/L (ref 98–111)
Creatinine, Ser: 0.7 mg/dL (ref 0.61–1.24)
GFR calc Af Amer: >60 mL/min (ref >60)
GFR calc non Af Amer: >60 mL/min (ref >60)
Glucose, Bld: 111 mg/dL — ABNORMAL HIGH (ref 70–99)
Potassium: 3.7 mmol/L (ref 3.5–5.1)
Sodium: 136 mmol/L (ref 135–145)
Total Bilirubin: 0.8 mg/dL (ref 0.3–1.2)
Total Protein: 7.6 g/dL (ref 6.5–8.1)

## 2019-06-03 LAB — CBC
HCT: 32.3 % — ABNORMAL LOW (ref 39.0–52.0)
Hemoglobin: 11.8 g/dL — ABNORMAL LOW (ref 13.0–17.0)
MCH: 32.2 pg (ref 26.0–34.0)
MCHC: 36.5 g/dL — ABNORMAL HIGH (ref 30.0–36.0)
MCV: 88 fL (ref 80.0–100.0)
Platelets: 181 10*3/uL (ref 150–400)
RBC: 3.67 MIL/uL — ABNORMAL LOW (ref 4.22–5.81)
RDW: 12.9 % (ref 11.5–15.5)
WBC: 9.4 10*3/uL (ref 4.0–10.5)
nRBC: 0 % (ref 0.0–0.2)

## 2019-06-03 LAB — PHOSPHORUS: Phosphorus: 3.1 mg/dL (ref 2.5–4.6)

## 2019-06-03 LAB — MAGNESIUM: Magnesium: 1.9 mg/dL (ref 1.7–2.4)

## 2019-06-03 MED ORDER — RIFAXIMIN 200 MG PO TABS: 200.0000 mg | ORAL_TABLET | Freq: Two times a day (BID) | ORAL | 0 refills | Status: AC

## 2019-06-03 NOTE — Progress Notes (Signed)
CIWA neg. TOC consult completed. Reports rt hand/wrist pain 3/10 with decreased edema.  Reports feeling well; denies issues/concerns. No seizure activity observed. Oral and written AVS instructions given with stated understanding.

## 2019-06-03 NOTE — TOC Initial Note (Addendum)
Transition of Care (TOC) - Initial/Assessment Note    Patient Details  Name: Joel LIEW Sr. MRN: [id number] Date of Birth: Jun 11, 1970  Transition of Care [facility]) CM/SW Contact:    Larwance Rote, LCSW Phone Number: 06/03/2019, 10:00 AM  Clinical Narrative:  The patient is a 49 year old male who reported that he is ready for a change.  He reported that he lives with his wife and has a long history of substance use, alcohol, and he is ready for inpatient rehabilitation.  Patient reported that his wife found a rehab facility: [facility].   Plan: Discharge home.   Plans on going to [facility].     Expected Discharge Plan: Home/Self Care Barriers to Discharge: Barriers Resolved(Patient plans on going to drug rehabilitation once he gets out of the hospital)   Patient Goals and CMS Choice     Expected Discharge Plan and Services Expected Discharge Plan: Home/Self Care In-house Referral: Clinical Social Work     Living arrangements for the past 2 months: Single Family Home                    Prior Living Arrangements/Services Living arrangements for the past 2 months: Single Family Home Lives with:: Spouse Patient language and need for interpreter reviewed:: No Do you feel safe going back to the place where you live?: Yes      Need for Family Participation in Patient Care: No (Comment) Care giver support system in place?: Yes (comment)   Criminal Activity/Legal Involvement Pertinent to Current Situation/Hospitalization: No - Comment as needed  Activities of Daily Living Home Assistive Devices/Equipment: None ADL Screening (condition at time of admission) Patient's cognitive ability adequate to safely complete daily activities?: Yes Is the patient deaf or have difficulty hearing?: No Does the patient have difficulty seeing, even when wearing glasses/contacts?: No Does the patient have difficulty concentrating, remembering, or  making decisions?: No Patient able to express need for assistance with ADLs?: Yes Does the patient have difficulty dressing or bathing?: No Independently performs ADLs?: Yes (appropriate for developmental age) Does the patient have difficulty walking or climbing stairs?: No Weakness of Legs: None Weakness of Arms/Hands: None  Permission Sought/Granted Permission sought to share information with : Family Supports, Case Manager Permission granted to share information with : Yes, Verbal Permission Granted  Share Information with NAME: Joel Tanner [phone]           Emotional Assessment Appearance:: Appears older than stated age Attitude/Demeanor/Rapport: Gracious Affect (typically observed): Sad Orientation: : Oriented to Self, Oriented to Place, Oriented to  Time, Oriented to Situation Alcohol / Substance Use: Alcohol Use Psych Involvement: No (comment)  Admission diagnosis:  Hepatic encephalopathy (HCC) [K72.90] Seizures (HCC) [R56.9] Alcoholic intoxication with complication [facility]) [F10.929] Patient Active Problem List   Diagnosis Date Noted  . Alcoholic intoxication with complication (HCC)   . Hypokalemia   . Seizures (HCC) 05/30/2019  . Hypothermia 05/13/2019  . Substance abuse (HCC) 04/01/2019  . Coagulopathy (HCC) 04/01/2019  . Alcohol use disorder, moderate, dependence (HCC) 04/01/2019  . Irritability 04/01/2019  . Acute hepatic encephalopathy 07/22/2015   PCP:  Dortha Kern, MD Pharmacy:   [facility] [address] - [place], [place] - [address] [place] [address] Phone: [phone] Fax: [phone]  [id number] - [place], [place] - [address] [place] [address] Phone: [phone] Fax: [phone]  Social Determinants of Health (SDOH)  Interventions    Readmission Risk Interventions No flowsheet data found.

## 2019-06-03 NOTE — Progress Notes (Signed)
Transported in transport chair to private vehicle. Discharge to home/self care.

## 2019-06-03 NOTE — Discharge Summary (Signed)
Physician Discharge Summary  Joel BlancoJohn P Capote Sr. ZOX:[id number] DOB: 02/22/1971 DOA: 05/30/2019  PCP: Dortha KernBliss, Laura K, MD  Admit date: 05/30/2019 Discharge date: 06/03/2019  Admitted From: home Disposition:  home  Recommendations for Outpatient Follow-up:  1. Follow up with PCP in 1-2 weeks 2. F/u w/ ortho surg in 1-2 weeks if right wrist is still painful & swollen  Home Health: no Equipment/Devices: n/a  Discharge Condition: stable CODE STATUS: full  Diet recommendation: Heart Healthy   Brief/Interim Summary: HPI taken from NP Elvina SidleKeene: [name] is a 49 year old male with a past medical history notable for alcohol abuse, hepatitis C, elevated transaminase, seizures, and tobacco abuse who presents to [facility] ED on 05/30/2019 with seizure-like activity.  Patiently is currently intubated and sedated with no family present, therefore history is obtained from ED and nursing notes.  Per notes has exhibited the seizure-like episodes after he has been drinking.  He is currently not on any antiepileptic medications as an outpatient.  He was also recently admitted approximately 1 month ago for similar symptoms and found to have hepatic encephalopathy.  Initial work-up in the ED revealed ammonia 115, ethyl alcohol 181, salicylates less than 7, bicarb 20, AST 62, ALT 80.  His COVID-19 and influenza PCR's are both negative.  CT head negative for any acute intracranial abnormality.  Chest x-ray is negative for any acute disease process.  Urine drug screen is positive for cocaine and cannabinoid.  While in the ED he became increasingly somnolent, with decrease in his GCS from 10 to 6.  He was subsequently intubated for airway protection.  PCCM is asked to admit the patient to ICU for further work-up and treatment of alcohol intoxication and hepatic encephalopathy requiring intubation for airway protection.  Hospital Course from Dr. Wilfred LacyJ. Konnie Noffsinger 06/02/19-06/03/19: Pt was intubated in the ER for airway protection. Pt  was subsequently extubated. Of note, pt was started on rifaximin while inpatient for hepatic encephalopathy. Pt was alert, oriented X4 including situation prior to d/c. Pt vitals were stable and pt clinically was optimized. Unfortunately, psychiatry evaluation was not available inpatient right now but pt can f/u outpatient w/ psychiatry. Pt verbalized his understanding. Pt does not have any suicidal or homicidal thoughts/ideations. CM did speak with the pt at length about different substance programs available in the area. I also discussed cessation of alcohol use and pt stated that he wanted to stop drinking as well.   Discharge Diagnoses:  Active Problems:   Seizures (HCC)   Alcoholic intoxication with complication (HCC)   Hypokalemia   Alcoholic intoxication: s/p intubation, ventilation & extubation. Continue on ativan prn. Continue on thiamine, folic acid, & multivitamin. Alcohol cessation counseling. Pt is interested in programs for alcoholic pts & CM discussed with the pt   Hepatic encephalopathy: resolved. Secondary to alcohol abuse. Continue on home dose of lactulose & rifaximin.   Hypokalemia: WNL today. Will continue to monitor   Acute hypoxic respiratory failure: resolved  Hx of HCV: management per outpatient GI  Transaminitis: secondary to alcohol abuse. ALT is WNL but AST still slightly elevated. Will continue to monitor   Right wrist pain & swelling: no fracture as per XR. Tramadol prn. Previous remote hx of trauma to right wrist. Recommend pt see an ortho surg as an outpatient  Discharge Instructions  Discharge Instructions    Diet - low sodium heart healthy   Complete by: As directed    Discharge instructions   Complete by: As directed    F/u PCP  in 1-2 weeks; F/u w/ ortho surg in 1-2 weeks if right wrist still painful and swollen; F/u substance abuse programs ASAP   Increase activity slowly   Complete by: As directed      Allergies as of 06/03/2019   No Known  Allergies     Medication List    TAKE these medications   atorvastatin 40 MG tablet Commonly known as: LIPITOR Take 1 tablet (40 mg total) by mouth daily at 6 PM. Notes to patient: Supper tonight   B-100 B-COMPLEX PO Take 1 tablet by mouth daily. Notes to patient: Monday morning   disulfiram 250 MG tablet Commonly known as: ANTABUSE Take 250 mg by mouth daily. Notes to patient: Monday morning   folic acid 1 MG tablet Commonly known as: FOLVITE Take 1 tablet (1 mg total) by mouth daily. Notes to patient: Monday morning   lactulose 10 GM/15ML solution Commonly known as: CHRONULAC Take 15 mLs (10 g total) by mouth 2 (two) times daily as needed for mild constipation (And confusion).   multivitamin with minerals tablet Take 1 tablet by mouth daily. Notes to patient: Monday morning   omeprazole 20 MG capsule Commonly known as: PRILOSEC Take 20 mg by mouth daily as needed.   rifaximin 200 MG tablet Commonly known as: XIFAXAN Take 1 tablet (200 mg total) by mouth 2 (two) times daily. Notes to patient: Tonight   tadalafil 20 MG tablet Commonly known as: CIALIS Take 20 mg by mouth daily as needed for erectile dysfunction.   thiamine 100 MG tablet Take 1 tablet (100 mg total) by mouth daily. Notes to patient: Monday morning   venlafaxine XR 150 MG 24 hr capsule Commonly known as: EFFEXOR-XR Take 150 mg by mouth daily. Notes to patient: Monday morning       No Known Allergies  Consultations:  ICU, psychiatry   Procedures/Studies: EEG  Result Date: 05/31/2019 Thana Farr, MD     05/31/2019  7:17 PM ELECTROENCEPHALOGRAM REPORT Patient: Joel Tanner Sr.       Room #: IC05A-AA EEG No. ID: [id number] Age: 49 y.o.        Sex: male Referring Physician: Karna Christmas Report Date:  05/31/2019       Interpreting Physician: Thana Farr History: Joel Tanner Sr. is an 49 y.o. male presenting with seizure like activity Medications: Fentanyl, Precedex, Thiamine  Conditions of Recording:  This is a 21 channel routine scalp EEG performed with bipolar and monopolar montages arranged in accordance to the international 10/20 system of electrode placement. One channel was dedicated to EKG recording. The patient is in the awake state. Description:  The background activity is slow and poorly organized.  It consists of a low voltage to moderate voltage polymorphic delta activity that is diffusely distributed.  This activity is continuous throughout the recording.  No epileptiform activity is noted.  Hyperventilation and intermittent photic stimulation were not performed. IMPRESSION: This is an abnormal EEG secondary to general background slowing.  This finding may be seen with a diffuse disturbance that is etiologically nonspecific, but may include a metabolic encephalopathy, among other possibilities.  No epileptiform activity is noted.  Thana Farr, MD Neurology [phone] 05/31/2019, 7:11 PM   DG Wrist Complete Right  Result Date: 05/30/2019 CLINICAL DATA:  Seizure EXAM: RIGHT WRIST - COMPLETE 3+ VIEW COMPARISON:  None. FINDINGS: No acute displaced fracture is seen. There is possible old fracture deformity of the distal radius. Degenerative change at the distal radioulnar joint. Lateral view demonstrates dorsal  subluxation of the carpal bones with respect to the distal radius with slight widened appearance the dorsal joint space. Soft tissue swelling is evident. IMPRESSION: 1. No acute displaced fracture is seen. 2. Suspected chronic deformity at the distal radius. Lateral view suggests mild dorsal subluxation of the proximal carpal bones with respect to the distal radius with widened appearance of the dorsal joint space. Electronically Signed   By: Jasmine Pang M.D.   On: 05/30/2019 21:30   CT Head Wo Contrast  Result Date: 05/30/2019 CLINICAL DATA:  Head trauma. EXAM: CT HEAD WITHOUT CONTRAST TECHNIQUE: Contiguous axial images were obtained from the base of the  skull through the vertex without intravenous contrast. COMPARISON:  May 13, 2019. FINDINGS: Brain: No evidence of acute infarction, hemorrhage, hydrocephalus, extra-axial collection or mass lesion/mass effect. Vascular: No hyperdense vessel or unexpected calcification. Skull: Normal. Negative for fracture or focal lesion. Sinuses/Orbits: There is mucosal thickening of the ethmoid air cells and right frontal sinus. The remaining paranasal sinuses and mastoid air cells are essentially clear. Other: None. IMPRESSION: 1. No acute intracranial abnormality. 2. Sinus disease as detailed. Electronically Signed   By: Katherine Mantle M.D.   On: 05/30/2019 21:23   CT Head Wo Contrast  Result Date: 05/13/2019 CLINICAL DATA:  Found unresponsive on floor EXAM: CT HEAD WITHOUT CONTRAST TECHNIQUE: Contiguous axial images were obtained from the base of the skull through the vertex without intravenous contrast. COMPARISON:  03/31/2019 FINDINGS: Brain: No evidence of acute infarction, hemorrhage, hydrocephalus, extra-axial collection or mass lesion/mass effect. Vascular: No hyperdense vessel or unexpected calcification. Skull: Normal. Negative for fracture or focal lesion. Sinuses/Orbits: No acute finding. Other: None. IMPRESSION: No acute abnormality noted. Electronically Signed   By: Alcide Clever M.D.   On: 05/13/2019 03:40   DG Chest Port 1 View  Result Date: 05/31/2019 CLINICAL DATA:  Acute respiratory failure. EXAM: PORTABLE CHEST 1 VIEW COMPARISON:  Radiograph yesterday. FINDINGS: Endotracheal tube tip at the thoracic inlet, 4.3 cm from the carina. And side port of the enteric tube below the diaphragm in the stomach. Normal heart size and mediastinal contours. Improving lung aeration, previous Kerley lines have resolved. No focal airspace disease. No pleural fluid or pneumothorax. IMPRESSION: 1. Improving aeration since yesterday. 2. Stable support apparatus. Electronically Signed   By: Narda Rutherford M.D.    On: 05/31/2019 06:00   DG Chest Portable 1 View  Result Date: 05/30/2019 CLINICAL DATA:  Status post intubation. EXAM: PORTABLE CHEST 1 VIEW COMPARISON:  May 30, 2019. FINDINGS: The endotracheal tube terminates approximately 3 cm above the carina. The enteric tube terminates over the gastric body/fundus. The heart size is stable from prior study. There is no pneumothorax. There is suggestion of interval development of subtle hazy airspace opacities bilaterally. There are few Kerley B lines at the lung bases bilaterally. There is no large pleural effusion. IMPRESSION: 1. Lines and tubes as above. 2. Possible developing pulmonary edema. Electronically Signed   By: Katherine Mantle M.D.   On: 05/30/2019 23:16   DG Chest Portable 1 View  Result Date: 05/30/2019 CLINICAL DATA:  Altered mental status EXAM: PORTABLE CHEST 1 VIEW COMPARISON:  05/13/2019 FINDINGS: The heart size and mediastinal contours are within normal limits. Both lungs are clear. The visualized skeletal structures are unremarkable. IMPRESSION: No active disease. Electronically Signed   By: Elige Ko   On: 05/30/2019 21:26   DG Chest Port 1 View  Result Date: 05/13/2019 CLINICAL DATA:  Found unresponsive EXAM: PORTABLE CHEST 1 VIEW  COMPARISON:  08/30/2011 FINDINGS: Cardiac shadow is stable. The lungs are well aerated bilaterally. No focal infiltrate or sizable effusion is seen. No bony is noted. IMPRESSION: No active disease. Electronically Signed   By: Alcide Clever M.D.   On: 05/13/2019 03:21      Subjective: Pt c/o fatigue  Discharge Exam: Vitals:   06/03/19 0039 06/03/19 0833  BP: 132/78 129/76  Pulse: 79 72  Resp: 16 18  Temp: 98 F (36.7 C) 98.3 F (36.8 C)  SpO2: 97% 100%   Vitals:   06/02/19 1601 06/03/19 0039 06/03/19 0437 06/03/19 0833  BP: 125/81 132/78  129/76  Pulse: 78 79  72  Resp: 18 16  18   Temp: 98.8 F (37.1 C) 98 F (36.7 C)  98.3 F (36.8 C)  TempSrc: Oral Oral  Oral  SpO2: 100% 97%   100%  Weight:   69.6 kg   Height:        General: Pt is alert, awake, not in acute distress. Appears older than stated age Cardiovascular:  S1/S2 +, no rubs, no gallops Respiratory: CTA bilaterally, no wheezing, no rhonchi Abdominal: Soft, NT, ND, bowel sounds + Extremities: no edema, no cyanosis    The results of significant diagnostics from this hospitalization (including imaging, microbiology, ancillary and laboratory) are listed below for reference.     Microbiology: Recent Results (from the past 240 hour(s))  Respiratory Panel by RT PCR (Flu A&B, Covid) - Nasopharyngeal Swab     Status: None   Collection Time: 05/30/19  9:44 PM   Specimen: Nasopharyngeal Swab  Result Value Ref Range Status   SARS Coronavirus 2 by RT PCR NEGATIVE NEGATIVE Final    Comment: (NOTE) SARS-CoV-2 target nucleic acids are NOT DETECTED. The SARS-CoV-2 RNA is generally detectable in upper respiratoy specimens during the acute phase of infection. The lowest concentration of SARS-CoV-2 viral copies this assay can detect is 131 copies/mL. A negative result does not preclude SARS-Cov-2 infection and should not be used as the sole basis for treatment or other patient management decisions. A negative result may occur with  improper specimen collection/handling, submission of specimen other than nasopharyngeal swab, presence of viral mutation(s) within the areas targeted by this assay, and inadequate number of viral copies (<131 copies/mL). A negative result must be combined with clinical observations, patient history, and epidemiological information. The expected result is Negative. Fact Sheet for Patients:  06/01/19 Fact Sheet for Healthcare Providers:  https://www.moore.com/ This test is not yet ap proved or cleared by the https://www.young.biz/ FDA and  has been authorized for detection and/or diagnosis of SARS-CoV-2 by FDA under an Emergency Use  Authorization (EUA). This EUA will remain  in effect (meaning this test can be used) for the duration of the COVID-19 declaration under Section 564(b)(1) of the Act, 21 U.S.C. section 360bbb-3(b)(1), unless the authorization is terminated or revoked sooner.    Influenza A by PCR NEGATIVE NEGATIVE Final   Influenza B by PCR NEGATIVE NEGATIVE Final    Comment: (NOTE) The Xpert Xpress SARS-CoV-2/FLU/RSV assay is intended as an aid in  the diagnosis of influenza from Nasopharyngeal swab specimens and  should not be used as a sole basis for treatment. Nasal washings and  aspirates are unacceptable for Xpert Xpress SARS-CoV-2/FLU/RSV  testing. Fact Sheet for Patients: [place] Fact Sheet for Healthcare Providers: https://www.moore.com/ This test is not yet approved or cleared by the https://www.young.biz/ FDA and  has been authorized for detection and/or diagnosis of SARS-CoV-2 by  FDA  under an Emergency Use Authorization (EUA). This EUA will remain  in effect (meaning this test can be used) for the duration of the  Covid-19 declaration under Section 564(b)(1) of the Act, 21  U.S.C. section 360bbb-3(b)(1), unless the authorization is  terminated or revoked. Performed at [facility], [address] [place], [place] [address]   MRSA PCR Screening     Status: None   Collection Time: 05/31/19  3:41 AM   Specimen: Nasopharyngeal  Result Value Ref Range Status   MRSA by PCR NEGATIVE NEGATIVE Final    Comment:        The GeneXpert MRSA Assay (FDA approved for NASAL specimens only), is one component of a comprehensive MRSA colonization surveillance program. It is not intended to diagnose MRSA infection nor to guide or monitor treatment for MRSA infections. Performed at [facility], [address] [place], [place] [address]      Labs: BNP (last 3 results) Recent Labs    05/31/19 0450  BNP 09.8   Basic Metabolic  Panel: Recent Labs  Lab 05/30/19 2039 05/31/19 0450 06/01/19 0440 06/03/19 0330  NA 142 143 142 136  K 4.0 3.7 3.3* 3.7  CL 107 111 110 103  CO2 20* 20* 22 23  GLUCOSE 107* 80 95 111*  BUN 17 21* 23* 14  CREATININE 0.73 0.74  0.71 0.67 0.70  CALCIUM 9.1 8.2* 8.7* 8.9  MG  --   --   --  1.9  PHOS  --   --   --  3.1   Liver Function Tests: Recent Labs  Lab 05/30/19 2039 05/31/19 0450 06/03/19 0330  AST 62* 50* 43*  ALT 80* 63* 40  ALKPHOS 88 67 99  BILITOT 0.7 0.7 0.8  PROT 8.4* 7.0 7.6  ALBUMIN 4.6 3.7 3.6   No results for input(s): LIPASE, AMYLASE in the last 168 hours. Recent Labs  Lab 05/30/19 2047 06/01/19 0440  AMMONIA 110* 34   CBC: Recent Labs  Lab 05/30/19 2039 05/31/19 0450 06/01/19 0440 06/03/19 0330  WBC 7.6 6.7 10.4 9.4  NEUTROABS 4.5  --   --   --   HGB 14.0 12.4* 12.2* 11.8*  HCT 40.6 35.5* 36.2* 32.3*  MCV 91.0 90.3 92.6 88.0  PLT 239 220 181 181   Cardiac Enzymes: No results for input(s): CKTOTAL, CKMB, CKMBINDEX, TROPONINI in the last 168 hours. BNP: Invalid input(s): POCBNP CBG: Recent Labs  Lab 05/31/19 0332  GLUCAP 84   D-Dimer No results for input(s): DDIMER in the last 72 hours. Hgb A1c No results for input(s): HGBA1C in the last 72 hours. Lipid Profile No results for input(s): CHOL, HDL, LDLCALC, TRIG, CHOLHDL, LDLDIRECT in the last 72 hours. Thyroid function studies No results for input(s): TSH, T4TOTAL, T3FREE, THYROIDAB in the last 72 hours.  Invalid input(s): FREET3 Anemia work up No results for input(s): VITAMINB12, FOLATE, FERRITIN, TIBC, IRON, RETICCTPCT in the last 72 hours. Urinalysis    Component Value Date/Time   COLORURINE YELLOW (A) 06/02/2019 0013   APPEARANCEUR CLEAR (A) 06/02/2019 0013   APPEARANCEUR Clear 10/13/2012 0351   LABSPEC 1.023 06/02/2019 0013   LABSPEC 1.003 10/13/2012 0351   PHURINE 6.0 06/02/2019 0013   GLUCOSEU NEGATIVE 06/02/2019 0013   GLUCOSEU Negative 10/13/2012 0351   HGBUR  NEGATIVE 06/02/2019 0013   BILIRUBINUR NEGATIVE 06/02/2019 0013   BILIRUBINUR Negative 10/13/2012 0351   KETONESUR NEGATIVE 06/02/2019 0013   PROTEINUR NEGATIVE 06/02/2019 0013   NITRITE NEGATIVE 06/02/2019 0013  LEUKOCYTESUR NEGATIVE 06/02/2019 0013   LEUKOCYTESUR Negative 10/13/2012 0351   Sepsis Labs Invalid input(s): PROCALCITONIN,  WBC,  LACTICIDVEN Microbiology Recent Results (from the past 240 hour(s))  Respiratory Panel by RT PCR (Flu A&B, Covid) - Nasopharyngeal Swab     Status: None   Collection Time: 05/30/19  9:44 PM   Specimen: Nasopharyngeal Swab  Result Value Ref Range Status   SARS Coronavirus 2 by RT PCR NEGATIVE NEGATIVE Final    Comment: (NOTE) SARS-CoV-2 target nucleic acids are NOT DETECTED. The SARS-CoV-2 RNA is generally detectable in upper respiratoy specimens during the acute phase of infection. The lowest concentration of SARS-CoV-2 viral copies this assay can detect is 131 copies/mL. A negative result does not preclude SARS-Cov-2 infection and should not be used as the sole basis for treatment or other patient management decisions. A negative result may occur with  improper specimen collection/handling, submission of specimen other than nasopharyngeal swab, presence of viral mutation(s) within the areas targeted by this assay, and inadequate number of viral copies (<131 copies/mL). A negative result must be combined with clinical observations, patient history, and epidemiological information. The expected result is Negative. Fact Sheet for Patients:  https://www.moore.com/https://www.fda.gov/media/142436/download Fact Sheet for Healthcare Providers:  https://www.young.biz/https://www.fda.gov/media/142435/download This test is not yet ap proved or cleared by the [place] FDA and  has been authorized for detection and/or diagnosis of SARS-CoV-2 by FDA under an Emergency Use Authorization (EUA). This EUA will remain  in effect (meaning this test can be used) for the duration of the COVID-19  declaration under Section 564(b)(1) of the Act, 21 U.S.C. section 360bbb-3(b)(1), unless the authorization is terminated or revoked sooner.    Influenza A by PCR NEGATIVE NEGATIVE Final   Influenza B by PCR NEGATIVE NEGATIVE Final    Comment: (NOTE) The Xpert Xpress SARS-CoV-2/FLU/RSV assay is intended as an aid in  the diagnosis of influenza from Nasopharyngeal swab specimens and  should not be used as a sole basis for treatment. Nasal washings and  aspirates are unacceptable for Xpert Xpress SARS-CoV-2/FLU/RSV  testing. Fact Sheet for Patients: https://www.moore.com/https://www.fda.gov/media/142436/download Fact Sheet for Healthcare Providers: https://www.young.biz/https://www.fda.gov/media/142435/download This test is not yet approved or cleared by the [place] FDA and  has been authorized for detection and/or diagnosis of SARS-CoV-2 by  FDA under an Emergency Use Authorization (EUA). This EUA will remain  in effect (meaning this test can be used) for the duration of the  Covid-19 declaration under Section 564(b)(1) of the Act, 21  U.S.C. section 360bbb-3(b)(1), unless the authorization is  terminated or revoked. Performed at [facility], [address] [place], [place] [address]   MRSA PCR Screening     Status: None   Collection Time: 05/31/19  3:41 AM   Specimen: Nasopharyngeal  Result Value Ref Range Status   MRSA by PCR NEGATIVE NEGATIVE Final    Comment:        The GeneXpert MRSA Assay (FDA approved for NASAL specimens only), is one component of a comprehensive MRSA colonization surveillance program. It is not intended to diagnose MRSA infection nor to guide or monitor treatment for MRSA infections. Performed at [facility], [address] [place], [place] [address]      Time coordinating discharge: Over 30 minutes  SIGNED:   Charise KillianJamiese M Akshitha Culmer, MD  [facility] 06/03/2019, 1:33 PM Pager   If 7PM-7AM, please contact night-coverage [email or web address] Password [email or web address]

## 2019-07-27 ENCOUNTER — Other Ambulatory Visit: Payer: Self-pay

## 2019-07-27 ENCOUNTER — Encounter: Payer: Self-pay | Admitting: Emergency Medicine

## 2019-07-27 ENCOUNTER — Inpatient Hospital Stay: Payer: No Typology Code available for payment source

## 2019-07-27 ENCOUNTER — Inpatient Hospital Stay
Admission: EM | Admit: 2019-07-27 | Discharge: 2019-08-06 | DRG: 441 | Disposition: A | Discharge status: Home / Self Care | Payer: No Typology Code available for payment source | Attending: Internal Medicine | Admitting: Internal Medicine

## 2019-07-27 DIAGNOSIS — E785 Hyperlipidemia, unspecified: Secondary | ICD-10-CM | POA: Diagnosis present

## 2019-07-27 DIAGNOSIS — Z79899 Other long term (current) drug therapy: Secondary | ICD-10-CM

## 2019-07-27 DIAGNOSIS — Z20822 Contact with and (suspected) exposure to covid-19: Secondary | ICD-10-CM | POA: Diagnosis present

## 2019-07-27 DIAGNOSIS — Z978 Presence of other specified devices: Secondary | ICD-10-CM

## 2019-07-27 DIAGNOSIS — T68XXXA Hypothermia, initial encounter: Secondary | ICD-10-CM | POA: Diagnosis not present

## 2019-07-27 DIAGNOSIS — J9601 Acute respiratory failure with hypoxia: Secondary | ICD-10-CM | POA: Diagnosis present

## 2019-07-27 DIAGNOSIS — A0472 Enterocolitis due to Clostridium difficile, not specified as recurrent: Secondary | ICD-10-CM | POA: Diagnosis not present

## 2019-07-27 DIAGNOSIS — J984 Other disorders of lung: Secondary | ICD-10-CM

## 2019-07-27 DIAGNOSIS — R109 Unspecified abdominal pain: Secondary | ICD-10-CM

## 2019-07-27 DIAGNOSIS — K7030 Alcoholic cirrhosis of liver without ascites: Secondary | ICD-10-CM | POA: Diagnosis present

## 2019-07-27 DIAGNOSIS — T17418A Gastric contents in trachea causing other injury, initial encounter: Secondary | ICD-10-CM | POA: Diagnosis not present

## 2019-07-27 DIAGNOSIS — J690 Pneumonitis due to inhalation of food and vomit: Secondary | ICD-10-CM | POA: Diagnosis not present

## 2019-07-27 DIAGNOSIS — R0902 Hypoxemia: Secondary | ICD-10-CM

## 2019-07-27 DIAGNOSIS — I959 Hypotension, unspecified: Secondary | ICD-10-CM | POA: Diagnosis not present

## 2019-07-27 DIAGNOSIS — K7200 Acute and subacute hepatic failure without coma: Principal | ICD-10-CM | POA: Diagnosis present

## 2019-07-27 DIAGNOSIS — F1010 Alcohol abuse, uncomplicated: Secondary | ICD-10-CM | POA: Diagnosis present

## 2019-07-27 DIAGNOSIS — T1490XA Injury, unspecified, initial encounter: Secondary | ICD-10-CM

## 2019-07-27 DIAGNOSIS — R451 Restlessness and agitation: Secondary | ICD-10-CM | POA: Diagnosis not present

## 2019-07-27 DIAGNOSIS — K920 Hematemesis: Secondary | ICD-10-CM | POA: Diagnosis not present

## 2019-07-27 DIAGNOSIS — R509 Fever, unspecified: Secondary | ICD-10-CM

## 2019-07-27 DIAGNOSIS — K7010 Alcoholic hepatitis without ascites: Secondary | ICD-10-CM | POA: Diagnosis present

## 2019-07-27 DIAGNOSIS — K219 Gastro-esophageal reflux disease without esophagitis: Secondary | ICD-10-CM | POA: Diagnosis present

## 2019-07-27 DIAGNOSIS — K700 Alcoholic fatty liver: Secondary | ICD-10-CM | POA: Diagnosis present

## 2019-07-27 DIAGNOSIS — F10239 Alcohol dependence with withdrawal, unspecified: Secondary | ICD-10-CM | POA: Diagnosis not present

## 2019-07-27 DIAGNOSIS — E440 Moderate protein-calorie malnutrition: Secondary | ICD-10-CM | POA: Diagnosis present

## 2019-07-27 DIAGNOSIS — R569 Unspecified convulsions: Secondary | ICD-10-CM | POA: Diagnosis present

## 2019-07-27 DIAGNOSIS — R4182 Altered mental status, unspecified: Secondary | ICD-10-CM

## 2019-07-27 DIAGNOSIS — F17290 Nicotine dependence, other tobacco product, uncomplicated: Secondary | ICD-10-CM | POA: Diagnosis present

## 2019-07-27 DIAGNOSIS — B1920 Unspecified viral hepatitis C without hepatic coma: Secondary | ICD-10-CM | POA: Diagnosis present

## 2019-07-27 DIAGNOSIS — R040 Epistaxis: Secondary | ICD-10-CM | POA: Diagnosis present

## 2019-07-27 DIAGNOSIS — Y905 Blood alcohol level of 100-119 mg/100 ml: Secondary | ICD-10-CM | POA: Diagnosis present

## 2019-07-27 DIAGNOSIS — F10229 Alcohol dependence with intoxication, unspecified: Secondary | ICD-10-CM | POA: Diagnosis present

## 2019-07-27 DIAGNOSIS — F1910 Other psychoactive substance abuse, uncomplicated: Secondary | ICD-10-CM | POA: Diagnosis present

## 2019-07-27 DIAGNOSIS — Z6823 Body mass index (BMI) 23.0-23.9, adult: Secondary | ICD-10-CM

## 2019-07-27 DIAGNOSIS — K7682 Hepatic encephalopathy: Secondary | ICD-10-CM | POA: Diagnosis present

## 2019-07-27 DIAGNOSIS — Z720 Tobacco use: Secondary | ICD-10-CM | POA: Diagnosis not present

## 2019-07-27 DIAGNOSIS — J9600 Acute respiratory failure, unspecified whether with hypoxia or hypercapnia: Secondary | ICD-10-CM

## 2019-07-27 DIAGNOSIS — Z9911 Dependence on respirator [ventilator] status: Secondary | ICD-10-CM

## 2019-07-27 DIAGNOSIS — R52 Pain, unspecified: Secondary | ICD-10-CM

## 2019-07-27 DIAGNOSIS — Z0189 Encounter for other specified special examinations: Secondary | ICD-10-CM

## 2019-07-27 DIAGNOSIS — Z01818 Encounter for other preprocedural examination: Secondary | ICD-10-CM

## 2019-07-27 DIAGNOSIS — J9602 Acute respiratory failure with hypercapnia: Secondary | ICD-10-CM | POA: Diagnosis not present

## 2019-07-27 DIAGNOSIS — R339 Retention of urine, unspecified: Secondary | ICD-10-CM | POA: Diagnosis not present

## 2019-07-27 DIAGNOSIS — K7290 Hepatic failure, unspecified without coma: Secondary | ICD-10-CM

## 2019-07-27 LAB — URINALYSIS, COMPLETE (UACMP) WITH MICROSCOPIC
Bacteria, UA: NONE SEEN
Bilirubin Urine: NEGATIVE
Glucose, UA: 50 mg/dL — AB
Hgb urine dipstick: NEGATIVE
Ketones, ur: NEGATIVE mg/dL
Leukocytes,Ua: NEGATIVE
Nitrite: NEGATIVE
Protein, ur: NEGATIVE mg/dL
Specific Gravity, Urine: 1.019 (ref 1.005–1.030)
pH: 5 (ref 5.0–8.0)

## 2019-07-27 LAB — CBC WITH DIFFERENTIAL/PLATELET
Abs Immature Granulocytes: 0.03 10*3/uL (ref 0.00–0.07)
Basophils Absolute: 0.1 10*3/uL (ref 0.0–0.1)
Basophils Relative: 1 %
Eosinophils Absolute: 0 10*3/uL (ref 0.0–0.5)
Eosinophils Relative: 1 %
HCT: 37.3 % — ABNORMAL LOW (ref 39.0–52.0)
Hemoglobin: 13.3 g/dL (ref 13.0–17.0)
Immature Granulocytes: 0 %
Lymphocytes Relative: 37 %
Lymphs Abs: 2.9 10*3/uL (ref 0.7–4.0)
MCH: 32.4 pg (ref 26.0–34.0)
MCHC: 35.7 g/dL (ref 30.0–36.0)
MCV: 91 fL (ref 80.0–100.0)
Monocytes Absolute: 0.5 10*3/uL (ref 0.1–1.0)
Monocytes Relative: 7 %
Neutro Abs: 4.3 10*3/uL (ref 1.7–7.7)
Neutrophils Relative %: 54 %
Platelets: 262 10*3/uL (ref 150–400)
RBC: 4.1 MIL/uL — ABNORMAL LOW (ref 4.22–5.81)
RDW: 13.8 % (ref 11.5–15.5)
WBC: 7.8 10*3/uL (ref 4.0–10.5)
nRBC: 0 % (ref 0.0–0.2)

## 2019-07-27 LAB — CBC
HCT: 31.7 % — ABNORMAL LOW (ref 39.0–52.0)
Hemoglobin: 11 g/dL — ABNORMAL LOW (ref 13.0–17.0)
MCH: 32.4 pg (ref 26.0–34.0)
MCHC: 34.7 g/dL (ref 30.0–36.0)
MCV: 93.2 fL (ref 80.0–100.0)
Platelets: 211 10*3/uL (ref 150–400)
RBC: 3.4 MIL/uL — ABNORMAL LOW (ref 4.22–5.81)
RDW: 13.7 % (ref 11.5–15.5)
WBC: 8.9 10*3/uL (ref 4.0–10.5)
nRBC: 0 % (ref 0.0–0.2)

## 2019-07-27 LAB — URINE DRUG SCREEN, QUALITATIVE (ARMC ONLY)
Amphetamines, Ur Screen: NOT DETECTED
Barbiturates, Ur Screen: NOT DETECTED
Benzodiazepine, Ur Scrn: POSITIVE — AB
Cannabinoid 50 Ng, Ur ~~LOC~~: NOT DETECTED
Cocaine Metabolite,Ur ~~LOC~~: NOT DETECTED
MDMA (Ecstasy)Ur Screen: NOT DETECTED
Methadone Scn, Ur: NOT DETECTED
Opiate, Ur Screen: NOT DETECTED
Phencyclidine (PCP) Ur S: NOT DETECTED
Tricyclic, Ur Screen: NOT DETECTED

## 2019-07-27 LAB — COMPREHENSIVE METABOLIC PANEL
ALT: 54 U/L — ABNORMAL HIGH (ref 0–44)
AST: 70 U/L — ABNORMAL HIGH (ref 15–41)
Albumin: 4.3 g/dL (ref 3.5–5.0)
Alkaline Phosphatase: 75 U/L (ref 38–126)
Anion gap: 12 (ref 5–15)
BUN: 14 mg/dL (ref 6–20)
CO2: 20 mmol/L — ABNORMAL LOW (ref 22–32)
Calcium: 9.2 mg/dL (ref 8.9–10.3)
Chloride: 106 mmol/L (ref 98–111)
Creatinine, Ser: 0.81 mg/dL (ref 0.61–1.24)
GFR calc Af Amer: >60 mL/min (ref >60)
GFR calc non Af Amer: >60 mL/min (ref >60)
Glucose, Bld: 119 mg/dL — ABNORMAL HIGH (ref 70–99)
Potassium: 4 mmol/L (ref 3.5–5.1)
Sodium: 138 mmol/L (ref 135–145)
Total Bilirubin: 0.9 mg/dL (ref 0.3–1.2)
Total Protein: 8 g/dL (ref 6.5–8.1)

## 2019-07-27 LAB — BLOOD GAS, ARTERIAL
Acid-base deficit: 6.1 mmol/L — ABNORMAL HIGH (ref 0.0–2.0)
Bicarbonate: 18.9 mmol/L — ABNORMAL LOW (ref 20.0–28.0)
FIO2: 0.45
MECHVT: 500 mL
O2 Saturation: 99.4 %
PEEP: 5 cmH2O
Patient temperature: 37
RATE: 16 resp/min
pCO2 arterial: 35 mmHg (ref 32.0–48.0)
pH, Arterial: 7.34 — ABNORMAL LOW (ref 7.350–7.450)
pO2, Arterial: 163 mmHg — ABNORMAL HIGH (ref 83.0–108.0)

## 2019-07-27 LAB — MRSA PCR SCREENING: MRSA by PCR: NEGATIVE

## 2019-07-27 LAB — BASIC METABOLIC PANEL
Anion gap: 8 (ref 5–15)
BUN: 15 mg/dL (ref 6–20)
CO2: 19 mmol/L — ABNORMAL LOW (ref 22–32)
Calcium: 7.8 mg/dL — ABNORMAL LOW (ref 8.9–10.3)
Chloride: 113 mmol/L — ABNORMAL HIGH (ref 98–111)
Creatinine, Ser: 0.81 mg/dL (ref 0.61–1.24)
GFR calc Af Amer: >60 mL/min (ref >60)
GFR calc non Af Amer: >60 mL/min (ref >60)
Glucose, Bld: 118 mg/dL — ABNORMAL HIGH (ref 70–99)
Potassium: 4.3 mmol/L (ref 3.5–5.1)
Sodium: 140 mmol/L (ref 135–145)

## 2019-07-27 LAB — GLUCOSE, CAPILLARY
Glucose-Capillary: 203 mg/dL — ABNORMAL HIGH (ref 70–99)
Glucose-Capillary: 117 mg/dL — ABNORMAL HIGH (ref 70–99)
Glucose-Capillary: 119 mg/dL — ABNORMAL HIGH (ref 70–99)
Glucose-Capillary: 52 mg/dL — ABNORMAL LOW (ref 70–99)
Glucose-Capillary: 176 mg/dL — ABNORMAL HIGH (ref 70–99)
Glucose-Capillary: 56 mg/dL — ABNORMAL LOW (ref 70–99)
Glucose-Capillary: 82 mg/dL (ref 70–99)

## 2019-07-27 LAB — RESPIRATORY PANEL BY RT PCR (FLU A&B, COVID)
Influenza A by PCR: NEGATIVE
Influenza A by PCR: NEGATIVE
Influenza B by PCR: NEGATIVE
Influenza B by PCR: NEGATIVE
SARS Coronavirus 2 by RT PCR: NEGATIVE
SARS Coronavirus 2 by RT PCR: NEGATIVE

## 2019-07-27 LAB — MAGNESIUM: Magnesium: 2.1 mg/dL (ref 1.7–2.4)

## 2019-07-27 LAB — HEPATIC FUNCTION PANEL
ALT: 46 U/L — ABNORMAL HIGH (ref 0–44)
AST: 54 U/L — ABNORMAL HIGH (ref 15–41)
Albumin: 3.6 g/dL (ref 3.5–5.0)
Alkaline Phosphatase: 66 U/L (ref 38–126)
Bilirubin, Direct: 0.2 mg/dL (ref 0.0–0.2)
Indirect Bilirubin: 0.5 mg/dL (ref 0.3–0.9)
Total Bilirubin: 0.7 mg/dL (ref 0.3–1.2)
Total Protein: 6.5 g/dL (ref 6.5–8.1)

## 2019-07-27 LAB — AMMONIA: Ammonia: 302 umol/L — ABNORMAL HIGH (ref 9–35)

## 2019-07-27 LAB — TRIGLYCERIDES: Triglycerides: 1659 mg/dL — ABNORMAL HIGH (ref <150)

## 2019-07-27 LAB — PROCALCITONIN: Procalcitonin: <0.1 ng/mL

## 2019-07-27 LAB — SALICYLATE LEVEL: Salicylate Lvl: <7 mg/dL — ABNORMAL LOW (ref 7.0–30.0)

## 2019-07-27 LAB — ACETAMINOPHEN LEVEL: Acetaminophen (Tylenol), Serum: <10 ug/mL — ABNORMAL LOW (ref 10–30)

## 2019-07-27 LAB — LIPASE, BLOOD: Lipase: 23 U/L (ref 11–51)

## 2019-07-27 LAB — ETHANOL: Alcohol, Ethyl (B): 104 mg/dL — ABNORMAL HIGH (ref <10)

## 2019-07-27 LAB — AMYLASE: Amylase: 80 U/L (ref 28–100)

## 2019-07-27 LAB — PROTIME-INR
INR: 1.2 (ref 0.8–1.2)
Prothrombin Time: 15.5 seconds — ABNORMAL HIGH (ref 11.4–15.2)

## 2019-07-27 LAB — PHOSPHORUS: Phosphorus: 2.4 mg/dL — ABNORMAL LOW (ref 2.5–4.6)

## 2019-07-27 MED ORDER — PANTOPRAZOLE SODIUM 40 MG IV SOLR: 40.0000 mg | Freq: Two times a day (BID) | INTRAVENOUS | Status: DC

## 2019-07-27 MED ORDER — ONDANSETRON HCL 4 MG/2ML IJ SOLN: 4.0000 mg | Freq: Three times a day (TID) | INTRAMUSCULAR | Status: DC | PRN

## 2019-07-27 MED ORDER — ORAL CARE MOUTH RINSE
15.0000 mL | OROMUCOSAL | Status: DC
  Administered 2019-07-27 – 2019-07-30 (×27): 15 mL via OROMUCOSAL

## 2019-07-27 MED ORDER — DEXTROSE 50 % IV SOLN
25.0000 mL | Freq: Once | INTRAVENOUS | Status: AC
  Administered 2019-07-27: 22:00:00 25 mL via INTRAVENOUS

## 2019-07-27 MED ORDER — LORAZEPAM 2 MG/ML IJ SOLN
0.0000 mg | Freq: Four times a day (QID) | INTRAMUSCULAR | Status: DC
  Administered 2019-07-29: 10:00:00 2 mg via INTRAVENOUS
  Filled 2019-07-27: qty 1

## 2019-07-27 MED ORDER — PROPOFOL 500 MG/50ML IV EMUL
INTRAVENOUS | Status: AC
  Filled 2019-07-27: qty 50

## 2019-07-27 MED ORDER — LACTULOSE ENEMA
300.0000 mL | Freq: Three times a day (TID) | ORAL | Status: DC
  Filled 2019-07-27 (×2): qty 300

## 2019-07-27 MED ORDER — SODIUM CHLORIDE 0.9 % IV SOLN
1.0000 g | INTRAVENOUS | Status: DC
  Filled 2019-07-27: qty 10

## 2019-07-27 MED ORDER — MIDAZOLAM HCL 2 MG/2ML IJ SOLN
INTRAMUSCULAR | Status: AC
  Administered 2019-07-27: 17:00:00 2 mg
  Filled 2019-07-27: qty 2

## 2019-07-27 MED ORDER — PANTOPRAZOLE SODIUM 40 MG PO PACK: 40.0000 mg | PACK | Freq: Every day | ORAL | Status: DC

## 2019-07-27 MED ORDER — SODIUM CHLORIDE 0.9 % IV SOLN: INTRAVENOUS | Status: DC

## 2019-07-27 MED ORDER — FOLIC ACID 5 MG/ML IJ SOLN
1.0000 mg | Freq: Every day | INTRAMUSCULAR | Status: DC
  Filled 2019-07-27: qty 0.2

## 2019-07-27 MED ORDER — SODIUM CHLORIDE 0.9 % IV BOLUS
1000.0000 mL | Freq: Once | INTRAVENOUS | Status: AC
  Administered 2019-07-27: 14:00:00 1000 mL via INTRAVENOUS

## 2019-07-27 MED ORDER — LORAZEPAM 2 MG/ML IJ SOLN
1.0000 mg | Freq: Once | INTRAMUSCULAR | Status: AC
  Administered 2019-07-27: 13:00:00 1 mg via INTRAVENOUS
  Filled 2019-07-27: qty 1

## 2019-07-27 MED ORDER — LACTATED RINGERS IV SOLN: INTRAVENOUS | Status: DC

## 2019-07-27 MED ORDER — SODIUM CHLORIDE 0.9 % IV SOLN
80.0000 mg | Freq: Once | INTRAVENOUS | Status: AC
  Administered 2019-07-27: 80 mg via INTRAVENOUS
  Filled 2019-07-27: qty 80

## 2019-07-27 MED ORDER — ROCURONIUM BROMIDE 50 MG/5ML IV SOLN
INTRAVENOUS | Status: AC
  Administered 2019-07-27: 17:00:00 10 mg
  Filled 2019-07-27: qty 1

## 2019-07-27 MED ORDER — THIAMINE HCL 100 MG PO TABS: 100.0000 mg | ORAL_TABLET | Freq: Every day | ORAL | Status: DC

## 2019-07-27 MED ORDER — FENTANYL 2500MCG IN NS 250ML (10MCG/ML) PREMIX INFUSION
0.0000 ug/h | INTRAVENOUS | Status: DC
  Administered 2019-07-27: 18:00:00 300 ug/h via INTRAVENOUS
  Administered 2019-07-28: 18:00:00 250 ug/h via INTRAVENOUS
  Administered 2019-07-28: 01:00:00 325 ug/h via INTRAVENOUS
  Administered 2019-07-28: 275 ug/h via INTRAVENOUS
  Administered 2019-07-29 – 2019-07-30 (×6): 400 ug/h via INTRAVENOUS
  Filled 2019-07-27 (×10): qty 250

## 2019-07-27 MED ORDER — MIDAZOLAM 50MG/50ML (1MG/ML) PREMIX INFUSION
0.5000 mg/h | INTRAVENOUS | Status: DC
  Administered 2019-07-27 – 2019-07-28 (×2): 4 mg/h via INTRAVENOUS
  Administered 2019-07-28 – 2019-07-29 (×2): 2 mg/h via INTRAVENOUS
  Filled 2019-07-27 (×4): qty 50

## 2019-07-27 MED ORDER — DEXTROSE 5 % IV SOLN: INTRAVENOUS | Status: DC

## 2019-07-27 MED ORDER — PROPOFOL 1000 MG/100ML IV EMUL
INTRAVENOUS | Status: AC
  Administered 2019-07-27: 40 ug/kg/min via INTRAVENOUS
  Filled 2019-07-27: qty 100

## 2019-07-27 MED ORDER — LORAZEPAM 2 MG/ML IJ SOLN
1.0000 mg | Freq: Once | INTRAMUSCULAR | Status: AC
  Administered 2019-07-27: 1 mg via INTRAVENOUS

## 2019-07-27 MED ORDER — LACTULOSE 10 GM/15ML PO SOLN: 30.0000 g | Freq: Once | ORAL | Status: DC

## 2019-07-27 MED ORDER — FENTANYL CITRATE (PF) 100 MCG/2ML IJ SOLN
INTRAMUSCULAR | Status: AC
  Administered 2019-07-27: 17:00:00 100 ug
  Filled 2019-07-27: qty 2

## 2019-07-27 MED ORDER — DEXMEDETOMIDINE HCL IN NACL 400 MCG/100ML IV SOLN
0.4000 ug/kg/h | INTRAVENOUS | Status: DC
  Administered 2019-07-27 (×2): 1.2 ug/kg/h via INTRAVENOUS
  Administered 2019-07-28: 09:00:00 0.7 ug/kg/h via INTRAVENOUS
  Administered 2019-07-28: 02:00:00 0.8 ug/kg/h via INTRAVENOUS
  Administered 2019-07-28: 18:00:00 0.4 ug/kg/h via INTRAVENOUS
  Administered 2019-07-29 – 2019-07-30 (×7): 1.2 ug/kg/h via INTRAVENOUS
  Administered 2019-07-30 – 2019-07-31 (×2): 1 ug/kg/h via INTRAVENOUS
  Administered 2019-07-31: 1.5 ug/kg/h via INTRAVENOUS
  Administered 2019-07-31: 1 ug/kg/h via INTRAVENOUS
  Administered 2019-07-31: 1.2 ug/kg/h via INTRAVENOUS
  Administered 2019-08-01: 0.5 ug/kg/h via INTRAVENOUS
  Administered 2019-08-01: 1 ug/kg/h via INTRAVENOUS
  Administered 2019-08-01 (×2): 1.5 ug/kg/h via INTRAVENOUS
  Administered 2019-08-02: 1 ug/kg/h via INTRAVENOUS
  Filled 2019-07-27 (×24): qty 100

## 2019-07-27 MED ORDER — DEXTROSE 10 % IV SOLN: INTRAVENOUS | Status: DC

## 2019-07-27 MED ORDER — ENOXAPARIN SODIUM 40 MG/0.4ML ~~LOC~~ SOLN: 40.0000 mg | Freq: Every day | SUBCUTANEOUS | Status: DC

## 2019-07-27 MED ORDER — LACTULOSE 10 GM/15ML PO SOLN
30.0000 g | Freq: Three times a day (TID) | ORAL | Status: DC
  Administered 2019-07-27 – 2019-07-30 (×9): 30 g
  Filled 2019-07-27 (×8): qty 60

## 2019-07-27 MED ORDER — DEXTROSE 50 % IV SOLN
50.0000 mL | Freq: Once | INTRAVENOUS | Status: AC
  Administered 2019-07-27: 23:00:00 50 mL via INTRAVENOUS

## 2019-07-27 MED ORDER — THIAMINE HCL 100 MG/ML IJ SOLN: 100.0000 mg | Freq: Every day | INTRAMUSCULAR | Status: DC

## 2019-07-27 MED ORDER — SODIUM PHOSPHATES 45 MMOLE/15ML IV SOLN
10.0000 mmol | Freq: Once | INTRAVENOUS | Status: AC
  Administered 2019-07-27: 22:00:00 10 mmol via INTRAVENOUS
  Filled 2019-07-27: qty 3.33

## 2019-07-27 MED ORDER — ADULT MULTIVITAMIN W/MINERALS CH: 1.0000 | ORAL_TABLET | Freq: Every day | ORAL | Status: DC

## 2019-07-27 MED ORDER — LORAZEPAM 2 MG/ML IJ SOLN
0.0000 mg | Freq: Two times a day (BID) | INTRAMUSCULAR | Status: DC
  Filled 2019-07-27 (×2): qty 1

## 2019-07-27 MED ORDER — DEXTROSE-NACL 5-0.9 % IV SOLN: INTRAVENOUS | Status: DC

## 2019-07-27 MED ORDER — HALOPERIDOL LACTATE 5 MG/ML IJ SOLN
2.0000 mg | Freq: Once | INTRAMUSCULAR | Status: AC
  Administered 2019-07-27: 16:00:00 2 mg via INTRAVENOUS
  Filled 2019-07-27: qty 1

## 2019-07-27 MED ORDER — PROPOFOL 1000 MG/100ML IV EMUL: 5.0000 ug/kg/min | INTRAVENOUS | Status: DC

## 2019-07-27 MED ORDER — SODIUM CHLORIDE 0.9 % IV SOLN
2.0000 g | INTRAVENOUS | Status: DC
  Administered 2019-07-27 – 2019-07-29 (×3): 2 g via INTRAVENOUS
  Filled 2019-07-27 (×2): qty 2
  Filled 2019-07-27: qty 20
  Filled 2019-07-27: qty 2

## 2019-07-27 MED ORDER — FOLIC ACID 1 MG PO TABS: 1.0000 mg | ORAL_TABLET | Freq: Every day | ORAL | Status: DC

## 2019-07-27 MED ORDER — LORAZEPAM 2 MG/ML IJ SOLN: 2.0000 mg | Freq: Once | INTRAMUSCULAR | Status: DC

## 2019-07-27 MED ORDER — LORAZEPAM 1 MG PO TABS
1.0000 mg | ORAL_TABLET | ORAL | Status: AC | PRN
  Filled 2019-07-27: qty 2

## 2019-07-27 MED ORDER — SODIUM CHLORIDE 0.9 % IV SOLN
50.0000 ug/h | INTRAVENOUS | Status: DC
  Administered 2019-07-27 – 2019-07-29 (×6): 50 ug/h via INTRAVENOUS
  Filled 2019-07-27 (×13): qty 1

## 2019-07-27 MED ORDER — FENTANYL CITRATE (PF) 100 MCG/2ML IJ SOLN: 50.0000 ug | Freq: Once | INTRAMUSCULAR | Status: DC

## 2019-07-27 MED ORDER — CHLORHEXIDINE GLUCONATE CLOTH 2 % EX PADS
6.0000 | MEDICATED_PAD | Freq: Every day | CUTANEOUS | Status: DC
  Administered 2019-07-28 – 2019-08-06 (×9): 6 via TOPICAL

## 2019-07-27 MED ORDER — FOLIC ACID 5 MG/ML IJ SOLN
1.0000 mg | Freq: Every day | INTRAMUSCULAR | Status: DC
  Administered 2019-07-28 – 2019-08-02 (×6): 1 mg via INTRAVENOUS
  Filled 2019-07-27 (×8): qty 0.2

## 2019-07-27 MED ORDER — CHLORHEXIDINE GLUCONATE 0.12% ORAL RINSE (MEDLINE KIT)
15.0000 mL | Freq: Two times a day (BID) | OROMUCOSAL | Status: DC
  Administered 2019-07-27 – 2019-07-30 (×6): 15 mL via OROMUCOSAL

## 2019-07-27 MED ORDER — INSULIN (MYXREDLIN) INFUSION FOR HYPERTRIGLYCERIDEMIA
4.0000 [IU]/h | INTRAVENOUS | Status: DC
  Administered 2019-07-27: 21:00:00 7 [IU]/h via INTRAVENOUS
  Filled 2019-07-27: qty 100

## 2019-07-27 MED ORDER — ADULT MULTIVITAMIN LIQUID CH
15.0000 mL | Freq: Every day | ORAL | Status: DC
  Administered 2019-07-28 – 2019-07-30 (×3): 15 mL
  Filled 2019-07-27 (×5): qty 15

## 2019-07-27 MED ORDER — SODIUM CHLORIDE 0.9 % IV SOLN
8.0000 mg/h | INTRAVENOUS | Status: DC
  Administered 2019-07-27 – 2019-07-29 (×6): 8 mg/h via INTRAVENOUS
  Filled 2019-07-27 (×8): qty 80

## 2019-07-27 MED ORDER — LORAZEPAM 2 MG/ML IJ SOLN
1.0000 mg | INTRAMUSCULAR | Status: AC | PRN
  Administered 2019-07-29: 22:00:00 2 mg via INTRAVENOUS
  Administered 2019-07-30: 04:00:00 4 mg via INTRAVENOUS
  Administered 2019-07-30: 2 mg via INTRAVENOUS
  Filled 2019-07-27 (×3): qty 1

## 2019-07-27 MED ORDER — HEPARIN SODIUM (PORCINE) 5000 UNIT/ML IJ SOLN: 5000.0000 [IU] | Freq: Three times a day (TID) | INTRAMUSCULAR | Status: DC

## 2019-07-27 MED ORDER — ALBUMIN HUMAN 25 % IV SOLN
12.5000 g | Freq: Once | INTRAVENOUS | Status: AC
  Administered 2019-07-27: 22:00:00 12.5 g via INTRAVENOUS
  Filled 2019-07-27: qty 50

## 2019-07-27 MED ORDER — THIAMINE HCL 100 MG/ML IJ SOLN
500.0000 mg | Freq: Every day | INTRAVENOUS | Status: AC
  Administered 2019-07-27 – 2019-07-31 (×5): 500 mg via INTRAVENOUS
  Filled 2019-07-27 (×6): qty 5

## 2019-07-27 MED ORDER — THIAMINE HCL 100 MG/ML IJ SOLN: 100.0000 mg | Freq: Once | INTRAMUSCULAR | Status: DC

## 2019-07-27 MED ORDER — FENTANYL BOLUS VIA INFUSION
50.0000 ug | INTRAVENOUS | Status: DC | PRN
  Filled 2019-07-27: qty 50

## 2019-07-27 MED ORDER — THIAMINE HCL 100 MG/ML IJ SOLN
Freq: Once | INTRAVENOUS | Status: AC
  Filled 2019-07-27: qty 1000

## 2019-07-27 MED ORDER — LORAZEPAM 2 MG/ML IJ SOLN
1.0000 mg | INTRAMUSCULAR | Status: DC | PRN
  Administered 2019-07-30: 1 mg via INTRAVENOUS
  Filled 2019-07-27 (×2): qty 1

## 2019-07-27 NOTE — Progress Notes (Signed)
Pharmacy Electrolyte Monitoring Consult:  Pharmacy consulted to assist in monitoring and replacing electrolytes in this 49 y.o. male admitted on 07/27/2019 with Altered Mental Status. Patient is currently intubated and sedated on Fentanyl and Propofol infusions. Patient with past medical history significant for Hepatitis C, Alcohol abuse, hepatic encephalopathy.   Patient with epistaxis secondary to COVID swab. Patient being evaluated for GI Bleed post extubation.    Labs:  Sodium (mmol/L)  Date Value  07/27/2019 138  10/13/2012 133 (L)   Potassium (mmol/L)  Date Value  07/27/2019 4.0  10/13/2012 5.0   Magnesium (mg/dL)  Date Value  29/08/7531 2.1   Phosphorus (mg/dL)  Date Value  91/79/2178 2.4 (L)   Calcium (mg/dL)  Date Value  37/54/2370 9.2   Calcium, Total (mg/dL)  Date Value  23/06/7207 8.2 (L)   Albumin (g/dL)  Date Value  10/68/1661 4.3  10/13/2012 4.0    Assessment/Plan: 1. Electrolytes: will order sodium phosphorus IV x 1. Will replace to maintain Potassium ~ 4, Magnesium ~2, and phosphorus 2.5-3.   2. Glucose: no history of diabetes noted. Will monitor via BMPs and initiate SSI as appropriate.   3. Constipation: patient being evaluated for possible GI bleed and is NPO. Will continue to monitor.   4. Tube medications: patient currently NPO. Will continue to monitor.   Pharmacy will continue to monitor and adjust per consult.    Dyson Sevey L 07/27/2019 7:41 PM

## 2019-07-27 NOTE — Plan of Care (Signed)
Pt admitted to ICU 18, experienced copious epistaxis during COVID swab; (writing RN had been told by ED RN that she (ED RN) did not get an effective swab in the ED d/t pt's extreme agitation).  Pt never opened his eyes but thrashed continually, sat up and fought to get to the end of the bed and get up, appeared to be hallucinating.  His right hand appeared injured/deformed.  MD opted to sedate and tube patient.  Foley placed, unable to advance 18 Fr OG, 14Fr placed instead and connected to LIS.  COVID ruled out.  Pt is resting quietly at this time,  His wife Randa Evens has been informed of his current status.,  She is an Char[employer] fundraiser and is very realistic about his condition.  She would like to know from a physician if his liver has advanced to true cirrhosis.  She also stated he has stopped taking his meds at home, and has started drinking vodka (he formerly drank beer)

## 2019-07-27 NOTE — ED Triage Notes (Signed)
Arrives with mother who states she received a call from patient's work this morning stating that patient was confused.  Mom reports that patient has history of alcohol abuse and elevated ammonia when drinking alcohol.  Patient is awake and alert.  Resisting care.  Admits to drinking alcohol today.  Patient states he is hallucinating.  Frequently appears to be dodging things that is 'coming at him'.

## 2019-07-27 NOTE — ED Notes (Signed)
Pts mother left to go home. This tech informed pts mother that pts wife may not be allowed to come sit with pt when she "gets off work in a few hours."

## 2019-07-27 NOTE — ED Notes (Signed)
When attempting to provide care pt continuously thrashing in bed resisting care. When attempting to complete covid swab 2 staff members were holding arms and a third was attempting to hold head. Pt was still able to successfully turn head with swab in right nostril causing nose to bleed.

## 2019-07-27 NOTE — ED Notes (Addendum)
This tech in room with pt as 1:1 sitter. Mother present at bedside. MD in room to assess pt. Pt did have knife - security took knife, labeled it and took it to be locked up until pt is discharged.

## 2019-07-27 NOTE — ED Notes (Signed)
Pt awake

## 2019-07-27 NOTE — H&P (Addendum)
History and Physical    Joel Tanner ZLD:[id number] DOB: 06/30/1970 DOA: 07/27/2019  Referring MD/NP/PA:   PCP: Dortha Kern, MD   Patient coming from:  The patient is coming from home.  At baseline, pt is independent for most of ADL.        Chief Complaint: AMS  HPI: Joel SPINNEY Sr. is a 49 y.o. male with medical history significant of hepatitis C, tobacco abuse, alcohol abuse, seizure, GERD, hyperlipidemia, hepatic encephalopathy requiring intubation in the past, who presents with altered mental status.  Per EDP, pt's mother reported that patient was at work today, but was altered, acting abnormally and appeared quite confused.  They brought the patient to the emergency department. When I saw pt in ED, pt is very confused, not following command and restless. He moves all extremities. No facial droop.  Patient has a little nosebleeding after RN collected sample for COVID-19 test.  No active respiratory distress, cough, nausea, vomiting, diarrhea noted.  ED Course: pt was found to have ammonia 302, alcohol level 104, pending COVID-19 test, WBC 7.8, electrolytes renal function okay, liver function (AST 70, ALT 54, total bilirubin 0.9, ALP 75), temperature 96.5, blood pressure 150/82, tachycardia, oxygen saturation 99% on room air.  Patient is admitted to stepdown as inpatient.  PCCM, Dr. Karna Christmas is consulted.  Review of Systems: Could not reviewed due to altered mental status.  Allergy: No Known Allergies  Past Medical History:  Diagnosis Date  . Chronic alcohol abuse   . Elevated transaminase level   . Hepatitis C   . Seizure ([facility])   . Tobacco use     Past Surgical History:  Procedure Laterality Date  . CARPAL TUNNEL RELEASE      Social History:  reports that he has been smoking cigars. He has been smoking about 6.00 packs per day. He has never used smokeless tobacco. He reports current alcohol use of about 6.0 standard drinks of alcohol per week. He reports current  drug use. Drug: Marijuana.  Family History: No family history on file.  Could not reviewed due to altered mental status.  Prior to Admission medications   Medication Sig Start Date End Date Taking? Authorizing Provider  atorvastatin (LIPITOR) 40 MG tablet Take 1 tablet (40 mg total) by mouth daily at 6 PM. 04/02/19 07/01/19  Amin, Loura Halt, MD  B Complex-Biotin-FA (B-100 B-COMPLEX PO) Take 1 tablet by mouth daily.    [provider]  disulfiram (ANTABUSE) 250 MG tablet Take 250 mg by mouth daily. 05/28/19   [provider]  folic acid (FOLVITE) 1 MG tablet Take 1 tablet (1 mg total) by mouth daily. 05/14/19   Dhungel, Theda Belfast, MD  lactulose (CHRONULAC) 10 GM/15ML solution Take 15 mLs (10 g total) by mouth 2 (two) times daily as needed for mild constipation (And confusion). 04/02/19   Amin, Loura Halt, MD  Multiple Vitamins-Minerals (MULTIVITAMIN WITH MINERALS) tablet Take 1 tablet by mouth daily. Patient not taking: Reported on 05/30/2019 05/14/19 05/13/20  Dhungel, Theda Belfast, MD  omeprazole (PRILOSEC) 20 MG capsule Take 20 mg by mouth daily as needed.    [provider]  tadalafil (CIALIS) 20 MG tablet Take 20 mg by mouth daily as needed for erectile dysfunction.    [provider]  thiamine 100 MG tablet Take 1 tablet (100 mg total) by mouth daily. Patient not taking: Reported on 05/30/2019 05/14/19   Dhungel, Theda Belfast, MD  venlafaxine XR (EFFEXOR-XR) 150 MG 24 hr capsule Take 150  mg by mouth daily. 05/11/19   [provider]    Physical Exam: Vitals:   07/27/19 1313 07/27/19 1551 07/27/19 1554 07/27/19 1559  BP:    (!) 167/81  Pulse:  (!) 121 (!) 110 (!) 102  Resp:    17  Temp: (!) 96.5 F (35.8 C)     TempSrc: Axillary     SpO2:  100% 98% 96%  Weight:       General: Not in acute distress HEENT:       Eyes: PERRL, EOMI, no scleral icterus.       ENT: No discharge from the ears and nose.      Neck: No JVD, no bruit, no mass  felt. Heme: No neck lymph node enlargement. Cardiac: S1/S2, RRR, No murmurs, No gallops or rubs. Respiratory: No rales, wheezing, rhonchi or rubs. GI: Soft, nondistended, nontender, no organomegaly, BS present. GU: No hematuria Ext: No pitting leg edema bilaterally. 2+DP/PT pulse bilaterally. Musculoskeletal: No joint deformities, No joint redness or warmth, no limitation of ROM in spin. Skin: No rashes.  Neuro: confused, not oriented X3, cranial nerves II-XII grossly intact, moves all extremities. Psych: Patient is not psychotic, no suicidal or hemocidal ideation.  Labs on Admission: I have personally reviewed following labs and imaging studies  CBC: Recent Labs  Lab 07/27/19 1300  WBC 7.8  NEUTROABS 4.3  HGB 13.3  HCT 37.3*  MCV 91.0  PLT 262   Basic Metabolic Panel: Recent Labs  Lab 07/27/19 1300  NA 138  K 4.0  CL 106  CO2 20*  GLUCOSE 119*  BUN 14  CREATININE 0.81  CALCIUM 9.2  MG 2.1  PHOS 2.4*   GFR: Estimated Creatinine Clearance: 107.4 mL/min (by C-G formula based on SCr of 0.81 mg/dL). Liver Function Tests: Recent Labs  Lab 07/27/19 1300  AST 70*  ALT 54*  ALKPHOS 75  BILITOT 0.9  PROT 8.0  ALBUMIN 4.3   No results for input(s): LIPASE, AMYLASE in the last 168 hours. Recent Labs  Lab 07/27/19 1300  AMMONIA 302*   Coagulation Profile: No results for input(s): INR, PROTIME in the last 168 hours. Cardiac Enzymes: No results for input(s): CKTOTAL, CKMB, CKMBINDEX, TROPONINI in the last 168 hours. BNP (last 3 results) No results for input(s): PROBNP in the last 8760 hours. HbA1C: No results for input(s): HGBA1C in the last 72 hours. CBG: No results for input(s): GLUCAP in the last 168 hours. Lipid Profile: No results for input(s): CHOL, HDL, LDLCALC, TRIG, CHOLHDL, LDLDIRECT in the last 72 hours. Thyroid Function Tests: No results for input(s): TSH, T4TOTAL, FREET4, T3FREE, THYROIDAB in the last 72 hours. Anemia Panel: No results for  input(s): VITAMINB12, FOLATE, FERRITIN, TIBC, IRON, RETICCTPCT in the last 72 hours. Urine analysis:    Component Value Date/Time   COLORURINE YELLOW (A) 06/02/2019 0013   APPEARANCEUR CLEAR (A) 06/02/2019 0013   APPEARANCEUR Clear 10/13/2012 0351   LABSPEC 1.023 06/02/2019 0013   LABSPEC 1.003 10/13/2012 0351   PHURINE 6.0 06/02/2019 0013   GLUCOSEU NEGATIVE 06/02/2019 0013   GLUCOSEU Negative 10/13/2012 0351   HGBUR NEGATIVE 06/02/2019 0013   BILIRUBINUR NEGATIVE 06/02/2019 0013   BILIRUBINUR Negative 10/13/2012 0351   KETONESUR NEGATIVE 06/02/2019 0013   PROTEINUR NEGATIVE 06/02/2019 0013   NITRITE NEGATIVE 06/02/2019 0013   LEUKOCYTESUR NEGATIVE 06/02/2019 0013   LEUKOCYTESUR Negative 10/13/2012 0351   Sepsis Labs: @LABRCNTIP (procalcitonin:4,lacticidven:4) ) Recent Results (from the past 240 hour(s))  Respiratory Panel by RT PCR (Flu A&B, Covid) -  Nasopharyngeal Swab     Status: None   Collection Time: 07/27/19  3:34 PM   Specimen: Nasopharyngeal Swab  Result Value Ref Range Status   SARS Coronavirus 2 by RT PCR NEGATIVE NEGATIVE Final    Comment: (NOTE) SARS-CoV-2 target nucleic acids are NOT DETECTED. The SARS-CoV-2 RNA is generally detectable in upper respiratoy specimens during the acute phase of infection. The lowest concentration of SARS-CoV-2 viral copies this assay can detect is 131 copies/mL. A negative result does not preclude SARS-Cov-2 infection and should not be used as the sole basis for treatment or other patient management decisions. A negative result may occur with  improper specimen collection/handling, submission of specimen other than nasopharyngeal swab, presence of viral mutation(s) within the areas targeted by this assay, and inadequate number of viral copies (<131 copies/mL). A negative result must be combined with clinical observations, patient history, and epidemiological information. The expected result is Negative. Fact Sheet for Patients:   PinkCheek.be Fact Sheet for Healthcare Providers:  GravelBags.it This test is not yet ap proved or cleared by the [place] FDA and  has been authorized for detection and/or diagnosis of SARS-CoV-2 by FDA under an Emergency Use Authorization (EUA). This EUA will remain  in effect (meaning this test can be used) for the duration of the COVID-19 declaration under Section 564(b)(1) of the Act, 21 U.S.C. section 360bbb-3(b)(1), unless the authorization is terminated or revoked sooner.    Influenza A by PCR NEGATIVE NEGATIVE Final   Influenza B by PCR NEGATIVE NEGATIVE Final    Comment: (NOTE) The Xpert Xpress SARS-CoV-2/FLU/RSV assay is intended as an aid in  the diagnosis of influenza from Nasopharyngeal swab specimens and  should not be used as a sole basis for treatment. Nasal washings and  aspirates are unacceptable for Xpert Xpress SARS-CoV-2/FLU/RSV  testing. Fact Sheet for Patients: PinkCheek.be Fact Sheet for Healthcare Providers: GravelBags.it This test is not yet approved or cleared by the [place] FDA and  has been authorized for detection and/or diagnosis of SARS-CoV-2 by  FDA under an Emergency Use Authorization (EUA). This EUA will remain  in effect (meaning this test can be used) for the duration of the  Covid-19 declaration under Section 564(b)(1) of the Act, 21  U.S.C. section 360bbb-3(b)(1), unless the authorization is  terminated or revoked. Performed at [facility], [address], [place], [place] [address]      Radiological Exams on Admission: No results found.   EKG: Independently reviewed.  Sinus rhythm, QTC 472, no ischemic change  Assessment/Plan Principal Problem:   Acute hepatic encephalopathy Active Problems:   Hypothermia   HLD (hyperlipidemia)   GERD (gastroesophageal reflux disease)   Alcohol abuse    Tobacco abuse   Acute hepatic encephalopathy: Patient has altered mental status is most likely due to hepatic encephalopathy. He has hx of HCV and hepatic encephalopathy. Ammonia level is 302, which is consistent with hepatic encephalopathy.  -will admit to SDU as inp;t -start lactulose per rectum -Frequent neuro check -check UDS -PCCM, Dr. Lanney Gins is consulted for possible intubation -safety sitter at bedside -f/u CT-head  Hypothermia: -Bair Hugger  HLD (hyperlipidemia): -hold lipitor  GERD (gastroesophageal reflux disease): -IV pepcid  Alcohol abuse: -CIWA protocol  Tobacco abuse -Nicotine patch    DVT ppx: SCD Code Status: Full code Family Communication: not done, no family member is at bed side.     Disposition Plan:  Anticipate discharge back to previous home environment Consults called:  PCCM, Dr. Lanney Gins Admission  status:  SDU/inpation       Date of Service 07/27/2019    Lorretta Harp [facility]   If 7PM-7AM, please contact night-coverage [email or web address] 07/27/2019, 6:12 PM

## 2019-07-27 NOTE — Consult Note (Signed)
CRITICAL CARE PROGRESS NOTE    Name: Joel SENNE Sr. MRN: [id number] DOB: May 26, 1971     LOS: 0   SUBJECTIVE FINDINGS & SIGNIFICANT EVENTS   Patient description:  HPI: Joel KRONK Sr. is a 49 y.o. male with medical history significant of hepatitis C, tobacco abuse, alcohol abuse, seizure, GERD, hyperlipidemia, hepatic encephalopathy requiring intubation in the past, who presents with altered mental status.Per EDP, pt's mother reported that patient was at work today, but was altered, acting abnormally and appeared quite confused. They brought the patient to the emergency department. When I saw pt in ED, pt is very confused, not following command and restless. He moves all extremities. No facial droop.  Patient has a little nosebleeding after RN collected sample for COVID-19 test.  No active respiratory distress, cough, nausea, vomiting, diarrhea noted.Pt was found to have ammonia 302, alcohol level 104, pending COVID-19 test, WBC 7.8, electrolytes renal function okay, liver function (AST 70, ALT 54, total bilirubin 0.9, ALP 75), temperature 96.5, blood pressure 150/82, tachycardia, oxygen saturation 99% on room air.  Patient is admitted to stepdown as inpatient  Lines / Drains: PIV x2  Cultures / Sepsis markers: Blood cultures  Antibiotics: Rocephin   Protocols / Consultants: GI consultation  Tests / Events: KUB, CXR  Overnight: Patient was actively exsanguinating from his nose and mouth on arrival covering the entire bed and portion of floor with blood and required emergent intubation.   PAST MEDICAL HISTORY   Past Medical History:  Diagnosis Date  . Chronic alcohol abuse   . Elevated transaminase level   . Hepatitis C   . Seizure ([place])   . Tobacco use      SURGICAL HISTORY   Past Surgical  History:  Procedure Laterality Date  . CARPAL TUNNEL RELEASE       FAMILY HISTORY   No family history on file.   SOCIAL HISTORY   Social History   Tobacco Use  . Smoking status: Current Every Day Smoker    Packs/day: 6.00    Types: Cigars  . Smokeless tobacco: Never Used  Substance Use Topics  . Alcohol use: Yes    Alcohol/week: 6.0 standard drinks    Types: 6 Cans of beer per week    Comment: 18  . Drug use: Yes    Types: Marijuana     MEDICATIONS   Current Medication:  Current Facility-Administered Medications:  .  chlorhexidine gluconate (MEDLINE KIT) (PERIDEX) 0.12 % solution 15 mL, 15 mL, Mouth Rinse, BID, Lanney Gins, Ronnesha Mester, MD .  Derrill Memo ON 07/28/2019] Chlorhexidine Gluconate Cloth 2 % PADS 6 each, 6 each, Topical, Q0600, Lanney Gins, Monzerrat Wellen, MD .  dexmedetomidine (PRECEDEX) 400 MCG/100ML (4 mcg/mL) infusion, 0.4-1.2 mcg/kg/hr, Intravenous, Titrated, Yolanda Huffstetler, MD, Last Rate: 20.3 mL/hr at 07/27/19 1800, 1.2 mcg/kg/hr at 07/27/19 1800 .  [START ON 07/28/2019] enoxaparin (LOVENOX) injection 40 mg, 40 mg, Subcutaneous, Daily, Janthony Holleman, MD .  fentaNYL (SUBLIMAZE) bolus via infusion 50 mcg, 50 mcg, Intravenous, Q15 min PRN, Lanney Gins, Jamoni Broadfoot, MD .  fentaNYL (SUBLIMAZE) injection 50 mcg, 50 mcg, Intravenous, Once, Ahmaad Neidhardt, MD .  fentaNYL 2547mg in NS 2546m(1059mml) infusion-PREMIX, 0-400 mcg/hr, Intravenous, Continuous, Lasonya Hubner, MD, Last Rate: 30 mL/hr at 07/27/19 1800, 300 mcg/hr at 07/27/19 1800 .  lactated ringers infusion, , Intravenous, Continuous, Adrena Nakamura, MD .  lactulose (CHRONULAC) 10 GM/15ML solution 30 g, 30 g, Per Tube, TID, Teresa Lemmerman, MD .  LORazepam (ATIVAN) injection 0-4 mg, 0-4 mg, Intravenous, Q6H **  FOLLOWED BY** [START ON 07/29/2019] LORazepam (ATIVAN) injection 0-4 mg, 0-4 mg, Intravenous, Q12H, Ivor Costa, MD .  LORazepam (ATIVAN) injection 1 mg, 1 mg, Intravenous, Q2H PRN, Ivor Costa, MD .  LORazepam (ATIVAN) tablet  1-4 mg, 1-4 mg, Oral, Q1H PRN **OR** LORazepam (ATIVAN) injection 1-4 mg, 1-4 mg, Intravenous, Q1H PRN, Ivor Costa, MD .  MEDLINE mouth rinse, 15 mL, Mouth Rinse, 10 times per day, Ottie Glazier, MD .  multivitamin liquid 15 mL, 15 mL, Per Tube, Daily, Ottie Glazier, MD .  pantoprazole sodium (PROTONIX) 40 mg/20 mL oral suspension 40 mg, 40 mg, Per Tube, Daily, Zephan Beauchaine, MD .  propofol (DIPRIVAN) 1000 MG/100ML infusion, 5-80 mcg/kg/min, Intravenous, Titrated, Lakena Sparlin, MD, Last Rate: 16.25 mL/hr at 07/27/19 1800, 40 mcg/kg/min at 07/27/19 1800 .  thiamine 548m in normal saline (542m IVPB, 500 mg, Intravenous, Daily, SiCharlett NoseRPH .  [START ON 08/01/2019] thiamine tablet 100 mg, 100 mg, Per Tube, Daily, [facility]Char[facility] NoseRPSamar[facility]  ALLERGIES   Patient has no known allergies.    REVIEW OF SYSTEMS     Unable to obtain as patient is poorly responsive with severely altered sensorium  PHYSICAL EXAMINATION   Vital Signs: Temp:  [96.5 F (35.8 C)-98.1 F (36.7 C)] 98.1 F (36.7 C) (02/26 1800) Pulse Rate:  [91-121] 93 (02/26 1800) Resp:  [9-20] 9 (02/26 1800) BP: (87-211)/(66-126) 87/66 (02/26 1800) SpO2:  [96 %-100 %] 99 % (02/26 1800) FiO2 (%):  [45 %] 45 % (02/26 1800) Weight:  [67.7 kg-69.6 kg] 67.7 kg (02/26 1800)  GENERAL: Altered mental status with confusion HEAD: Normocephalic, atraumatic.  EYES: Pupils equal, round, reactive to light.  No scleral icterus.  MOUTH: Moist mucosal membrane. NECK: Supple. No thyromegaly. No nodules. No JVD.  PULMONARY: Decreased breath sounds bilaterally CARDIOVASCULAR: S1 and S2. Regular rate and rhythm. No murmurs, rubs, or gallops.  GASTROINTESTINAL: Soft, nontender, non-distended. No masses. Positive bowel sounds. No hepatosplenomegaly.  MUSCULOSKELETAL: No swelling, clubbing, or edema.  NEUROLOGIC: Mild distress due to acute illness SKIN:intact,warm,dry   PERTINENT DATA     Infusions: . dexmedetomidine  (PRECEDEX) IV infusion 1.2 mcg/kg/hr (07/27/19 1800)  . fentaNYL infusion INTRAVENOUS 300 mcg/hr (07/27/19 1800)  . lactated ringers    . propofol (DIPRIVAN) infusion 40 mcg/kg/min (07/27/19 1800)  . thiamine injection     Scheduled Medications: . chlorhexidine gluconate (MEDLINE KIT)  15 mL Mouth Rinse BID  . [START ON 07/28/2019] Chlorhexidine Gluconate Cloth  6 each Topical Q0600  . [START ON 07/28/2019] enoxaparin (LOVENOX) injection  40 mg Subcutaneous Daily  . fentaNYL (SUBLIMAZE) injection  50 mcg Intravenous Once  . lactulose  30 g Per Tube TID  . LORazepam  0-4 mg Intravenous Q6H   Followed by  . [START ON 07/29/2019] LORazepam  0-4 mg Intravenous Q12H  . mouth rinse  15 mL Mouth Rinse 10 times per day  . multivitamin  15 mL Per Tube Daily  . pantoprazole sodium  40 mg Per Tube Daily  . [START ON 08/01/2019] thiamine  100 mg Per Tube Daily   PRN Medications: fentaNYL, LORazepam, LORazepam **OR** LORazepam Hemodynamic parameters:   Intake/Output: No intake/output data recorded.  Ventilator  Settings: Vent Mode: PRVC FiO2 (%):  [45 %] 45 % Set Rate:  [16 bmp] 16 bmp Vt Set:  [500 mL] 500 mL PEEP:  [5 cmH20] 5 cmH20    LAB RESULTS:  Basic Metabolic Panel: Recent Labs  Lab 07/27/19 1300  NA 138  K  4.0  CL 106  CO2 20*  GLUCOSE 119*  BUN 14  CREATININE 0.81  CALCIUM 9.2  MG 2.1  PHOS 2.4*   Liver Function Tests: Recent Labs  Lab 07/27/19 1300  AST 70*  ALT 54*  ALKPHOS 75  BILITOT 0.9  PROT 8.0  ALBUMIN 4.3   No results for input(s): LIPASE, AMYLASE in the last 168 hours. Recent Labs  Lab 07/27/19 1300  AMMONIA 302*   CBC: Recent Labs  Lab 07/27/19 1300  WBC 7.8  NEUTROABS 4.3  HGB 13.3  HCT 37.3*  MCV 91.0  PLT 262   Cardiac Enzymes: No results for input(s): CKTOTAL, CKMB, CKMBINDEX, TROPONINI in the last 168 hours. BNP: Invalid input(s): POCBNP CBG: No results for input(s): GLUCAP in the last 168 hours.   IMAGING  RESULTS:  Imaging: No results found. _0 @   ASSESSMENT AND PLAN    -Multidisciplinary rounds held today  Acute Hypoxic Respiratory Failure -Due to multiple episodes of aspiration of bloody vomitus -Status post emergent endotracheal intubation -continue Bronchodilator Therapy -Wean Fio2 and PEEP as tolerated -will perform SAT/SBT when respiratory parameters are met   Altered mental status with lethargy   -Likely due to hepatic encephalopathy    -Ammonia profoundly elevated, status post lactulose   Acute upper GI bleed -Protonix 40 mg twice daily -GI consultation urgently -Monitor H&H -Lactulose due to elevated venous ammonia levels with likely hepatic encephalopathy ICU telemetry monitoring  Transaminitis  -History of alcoholism/substance abuse   Moderate protein calorie malnutrition   -Hypophosphatemia-monitor for refeeding syndrome   History of alcoholism/substance abuse -Banana bag with thiamine and folate repletion -Patient is currently on propofol and fentanyl however still need to monitor for signs of withdrawal -Street of seizure disorder  NEUROLOGY - intubated and sedated - minimal sedation to achieve a RASS goal: -1 Wake up assessment pending    ID -continue IV abx as prescibed -follow up cultures  GI/Nutrition GI PROPHYLAXIS as indicated DIET-->TF's as tolerated Constipation protocol as indicated  ENDO - ICU hypoglycemic\Hyperglycemia protocol -check FSBS per protocol   ELECTROLYTES -follow labs as needed -replace as needed -pharmacy consultation   DVT/GI PRX ordered -SCDs  TRANSFUSIONS AS NEEDED MONITOR FSBS ASSESS the need for LABS as needed   Critical care provider statement:    Critical care time (minutes):  33   Critical care time was exclusive of:  Separately billable procedures and treating other patients   Critical care was necessary to treat or prevent imminent or life-threatening deterioration of the following  conditions:   Altered mental status with lethargy, acute hypoxemic respiratory failure, upper GI bleed, hematemesis, substance abuse, alcoholism, transaminitis, hypophosphatemia, multiple comorbid conditions   Critical care was time spent personally by me on the following activities:  Development of treatment plan with patient or surrogate, discussions with consultants, evaluation of patient's response to treatment, examination of patient, obtaining history from patient or surrogate, ordering and performing treatments and interventions, ordering and review of laboratory studies and re-evaluation of patient's condition.  I assumed direction of critical care for this patient from another provider in my specialty: no    This document was prepared using Dragon voice recognition software and may include unintentional dictation errors.    Ottie Glazier, M.D.  Division of [facility]

## 2019-07-27 NOTE — ED Provider Notes (Signed)
[facility] Emergency Department Provider Note  Time seen: 1:12 PM  I have reviewed the triage vital signs and the nursing notes.   HISTORY  Chief Complaint Altered Mental Status   HPI Joel MAN Sr. is a 49 y.o. male with a past medical history of alcohol abuse, hepatitis, seizure disorder, presents to the emergency department for altered mental status/confusion.  According to the patient's mother the patient was at work today but was altered, acting abnormally and appeared quite confused.  They brought the patient to the emergency department.  Here the patient is confused, cannot provide any meaningful history.  Is having trouble following basic commands such as rolling over onto his back he will state that he is going to but then does not or tries and is unable.  He is quite fidgety/restless.  Mom believes the patient still drinks alcohol but does not know when he last drank.  Does not know if the patient takes lactulose.   Past Medical History:  Diagnosis Date  . Chronic alcohol abuse   . Elevated transaminase level   . Hepatitis C   . Seizure (HCC)   . Tobacco use     Patient Active Problem List   Diagnosis Date Noted  . Alcoholic intoxication with complication (HCC)   . Hypokalemia   . Seizures (HCC) 05/30/2019  . Hypothermia 05/13/2019  . Substance abuse (HCC) 04/01/2019  . Coagulopathy (HCC) 04/01/2019  . Alcohol use disorder, moderate, dependence (HCC) 04/01/2019  . Irritability 04/01/2019  . Acute hepatic encephalopathy 07/22/2015    Past Surgical History:  Procedure Laterality Date  . CARPAL TUNNEL RELEASE      Prior to Admission medications   Medication Sig Start Date End Date Taking? Authorizing Provider  atorvastatin (LIPITOR) 40 MG tablet Take 1 tablet (40 mg total) by mouth daily at 6 PM. 04/02/19 07/01/19  Amin, Loura Halt, MD  B Complex-Biotin-FA (B-100 B-COMPLEX PO) Take 1 tablet by mouth daily.    [provider]   disulfiram (ANTABUSE) 250 MG tablet Take 250 mg by mouth daily. 05/28/19   [provider]  folic acid (FOLVITE) 1 MG tablet Take 1 tablet (1 mg total) by mouth daily. 05/14/19   Dhungel, Theda Belfast, MD  lactulose (CHRONULAC) 10 GM/15ML solution Take 15 mLs (10 g total) by mouth 2 (two) times daily as needed for mild constipation (And confusion). 04/02/19   Amin, Loura Halt, MD  Multiple Vitamins-Minerals (MULTIVITAMIN WITH MINERALS) tablet Take 1 tablet by mouth daily. Patient not taking: Reported on 05/30/2019 05/14/19 05/13/20  Dhungel, Theda Belfast, MD  omeprazole (PRILOSEC) 20 MG capsule Take 20 mg by mouth daily as needed.    [provider]  tadalafil (CIALIS) 20 MG tablet Take 20 mg by mouth daily as needed for erectile dysfunction.    [provider]  thiamine 100 MG tablet Take 1 tablet (100 mg total) by mouth daily. Patient not taking: Reported on 05/30/2019 05/14/19   Dhungel, Theda Belfast, MD  venlafaxine XR (EFFEXOR-XR) 150 MG 24 hr capsule Take 150 mg by mouth daily. 05/11/19   [provider]    No Known Allergies  No family history on file.  Social History Social History   Tobacco Use  . Smoking status: Current Every Day Smoker    Packs/day: 6.00    Types: Cigars  . Smokeless tobacco: Never Used  Substance Use Topics  . Alcohol use: Yes    Alcohol/week: 6.0 standard drinks    Types: 6 Cans of  beer per week    Comment: 18  . Drug use: Yes    Types: Marijuana    Review of Systems Unable to obtain an adequate/accurate review of systems secondary to altered mental status  ____________________________________________   PHYSICAL EXAM:  VITAL SIGNS: ED Triage Vitals [07/27/19 1248]  Enc Vitals Group     BP (!) 150/82     Pulse Rate (!) 112     Resp 20     Temp      Temp src      SpO2 99 %     Weight 153 lb 7 oz (69.6 kg)     Height      Head Circumference      Peak Flow      Pain Score 0     Pain Loc      Pain Edu?       Excl. in [place]?    Constitutional: Patient is very restless, mostly lying on his stomach, occasionally will attempt to turn over to answer questions but is unable to adequately do so.  Unable to answer questions with any meaningful answer.  Not following commands well. Eyes: Normal exam ENT      Head: Normocephalic and atraumatic.      Mouth/Throat: Mucous membranes are moist. Cardiovascular: Normal rate, regular rhythm around 100 bpm. Respiratory: Normal respiratory effort without tachypnea nor retractions. Breath sounds are clear Gastrointestinal: Soft and nontender. No distention.  No reaction to abdominal palpation. Musculoskeletal: Appears to be moving all extremities well. Neurologic: Patient having difficulty answering questions, appears confused.  Moves all extremities. Skin:  Skin is warm Psychiatric: Quite confused.    INITIAL IMPRESSION / ASSESSMENT AND PLAN / ED COURSE  Pertinent labs & imaging results that were available during my care of the patient were reviewed by me and considered in my medical decision making (see chart for details).   Patient presents to the emergency department for altered mental status/confusion.  Differential is quite broad but would include hyperammonemia/hepatic encephalopathy, infectious etiology, metabolic or electrolyte abnormality.  We will check labs including ammonia level, urinalysis.  We will dose IV Ativan, IV fluids and continue to closely monitor.  Patient with a significantly elevated ammonia level of 302 consistent with hepatic encephalopathy.  We will attempt to dose oral lactulose.  We will obtain a Covid swab.  I spoke to the hospitalist will be admitting to their service.  Joel Lunger Sr. was evaluated in Emergency Department on 07/27/2019 for the symptoms described in the history of present illness. He was evaluated in the context of the global COVID-19 pandemic, which necessitated consideration that the patient might be at risk for  infection with the SARS-CoV-2 virus that causes COVID-19. Institutional protocols and algorithms that pertain to the evaluation of patients at risk for COVID-19 are in a state of rapid change based on information released by regulatory bodies including the CDC and federal and state organizations. These policies and algorithms were followed during the patient's care in the ED.  ____________________________________________   FINAL CLINICAL IMPRESSION(S) / ED DIAGNOSES  Confusion Hepatic encephalopathy   Harvest Dark, MD 07/27/19 1435

## 2019-07-27 NOTE — ED Notes (Addendum)
Pt extremely resistant to care. Swatting at staff when attempting to provide care. Unable to remain still in bed, thrashing limbs around continuously. Unable to attach monitors at this time to to pt inability to remain still and resistance to care.

## 2019-07-27 NOTE — ED Notes (Signed)
Pt sleeping. 

## 2019-07-27 NOTE — ED Notes (Signed)
Pt continues to thrash around in bed and attempting to sit up and attempting to climb out of the end of the bed. RN and tech redirecting pt repeatedly. Unable to obtain BP due to pt inability to remain still.

## 2019-07-28 ENCOUNTER — Inpatient Hospital Stay: Payer: No Typology Code available for payment source

## 2019-07-28 LAB — COMPREHENSIVE METABOLIC PANEL
ALT: 37 U/L (ref 0–44)
AST: 48 U/L — ABNORMAL HIGH (ref 15–41)
Albumin: 3.3 g/dL — ABNORMAL LOW (ref 3.5–5.0)
Alkaline Phosphatase: 47 U/L (ref 38–126)
Anion gap: 7 (ref 5–15)
BUN: 14 mg/dL (ref 6–20)
CO2: 19 mmol/L — ABNORMAL LOW (ref 22–32)
Calcium: 7.5 mg/dL — ABNORMAL LOW (ref 8.9–10.3)
Chloride: 117 mmol/L — ABNORMAL HIGH (ref 98–111)
Creatinine, Ser: 0.81 mg/dL (ref 0.61–1.24)
GFR calc Af Amer: >60 mL/min (ref >60)
GFR calc non Af Amer: >60 mL/min (ref >60)
Glucose, Bld: 119 mg/dL — ABNORMAL HIGH (ref 70–99)
Potassium: 3 mmol/L — ABNORMAL LOW (ref 3.5–5.1)
Sodium: 143 mmol/L (ref 135–145)
Total Bilirubin: 0.8 mg/dL (ref 0.3–1.2)
Total Protein: 6 g/dL — ABNORMAL LOW (ref 6.5–8.1)

## 2019-07-28 LAB — PROCALCITONIN: Procalcitonin: <0.1 ng/mL

## 2019-07-28 LAB — HEMOGLOBIN AND HEMATOCRIT, BLOOD
HCT: 28.9 % — ABNORMAL LOW (ref 39.0–52.0)
HCT: 30.5 % — ABNORMAL LOW (ref 39.0–52.0)
HCT: 31.1 % — ABNORMAL LOW (ref 39.0–52.0)
Hemoglobin: 10 g/dL — ABNORMAL LOW (ref 13.0–17.0)
Hemoglobin: 10 g/dL — ABNORMAL LOW (ref 13.0–17.0)
Hemoglobin: 10.3 g/dL — ABNORMAL LOW (ref 13.0–17.0)

## 2019-07-28 LAB — APTT: aPTT: 39 seconds — ABNORMAL HIGH (ref 24–36)

## 2019-07-28 LAB — CBC
HCT: 29.5 % — ABNORMAL LOW (ref 39.0–52.0)
Hemoglobin: 10 g/dL — ABNORMAL LOW (ref 13.0–17.0)
MCH: 32.2 pg (ref 26.0–34.0)
MCHC: 33.9 g/dL (ref 30.0–36.0)
MCV: 94.9 fL (ref 80.0–100.0)
Platelets: 178 10*3/uL (ref 150–400)
RBC: 3.11 MIL/uL — ABNORMAL LOW (ref 4.22–5.81)
RDW: 14.2 % (ref 11.5–15.5)
WBC: 11.7 10*3/uL — ABNORMAL HIGH (ref 4.0–10.5)
nRBC: 0 % (ref 0.0–0.2)

## 2019-07-28 LAB — MAGNESIUM: Magnesium: 1.7 mg/dL (ref 1.7–2.4)

## 2019-07-28 LAB — GLUCOSE, CAPILLARY
Glucose-Capillary: 108 mg/dL — ABNORMAL HIGH (ref 70–99)
Glucose-Capillary: 111 mg/dL — ABNORMAL HIGH (ref 70–99)
Glucose-Capillary: 112 mg/dL — ABNORMAL HIGH (ref 70–99)
Glucose-Capillary: 113 mg/dL — ABNORMAL HIGH (ref 70–99)
Glucose-Capillary: 116 mg/dL — ABNORMAL HIGH (ref 70–99)
Glucose-Capillary: 118 mg/dL — ABNORMAL HIGH (ref 70–99)
Glucose-Capillary: 120 mg/dL — ABNORMAL HIGH (ref 70–99)
Glucose-Capillary: 121 mg/dL — ABNORMAL HIGH (ref 70–99)
Glucose-Capillary: 126 mg/dL — ABNORMAL HIGH (ref 70–99)
Glucose-Capillary: 127 mg/dL — ABNORMAL HIGH (ref 70–99)
Glucose-Capillary: 130 mg/dL — ABNORMAL HIGH (ref 70–99)
Glucose-Capillary: 130 mg/dL — ABNORMAL HIGH (ref 70–99)
Glucose-Capillary: 136 mg/dL — ABNORMAL HIGH (ref 70–99)
Glucose-Capillary: 141 mg/dL — ABNORMAL HIGH (ref 70–99)
Glucose-Capillary: 149 mg/dL — ABNORMAL HIGH (ref 70–99)
Glucose-Capillary: 154 mg/dL — ABNORMAL HIGH (ref 70–99)
Glucose-Capillary: 164 mg/dL — ABNORMAL HIGH (ref 70–99)
Glucose-Capillary: 171 mg/dL — ABNORMAL HIGH (ref 70–99)
Glucose-Capillary: 46 mg/dL — ABNORMAL LOW (ref 70–99)
Glucose-Capillary: 71 mg/dL (ref 70–99)
Glucose-Capillary: 72 mg/dL (ref 70–99)
Glucose-Capillary: 86 mg/dL (ref 70–99)

## 2019-07-28 LAB — AMMONIA: Ammonia: 55 umol/L — ABNORMAL HIGH (ref 9–35)

## 2019-07-28 LAB — STREP PNEUMONIAE URINARY ANTIGEN: Strep Pneumo Urinary Antigen: NEGATIVE

## 2019-07-28 LAB — TRIGLYCERIDES: Triglycerides: 453 mg/dL — ABNORMAL HIGH (ref <150)

## 2019-07-28 LAB — PHOSPHORUS: Phosphorus: 4.2 mg/dL (ref 2.5–4.6)

## 2019-07-28 MED ORDER — POTASSIUM CHLORIDE 20 MEQ PO PACK
20.0000 meq | PACK | Freq: Once | ORAL | Status: AC
  Administered 2019-07-28: 15:00:00 20 meq
  Filled 2019-07-28: qty 1

## 2019-07-28 MED ORDER — GLUCAGON HCL RDNA (DIAGNOSTIC) 1 MG IJ SOLR
1.0000 mg | Freq: Once | INTRAMUSCULAR | Status: AC
  Administered 2019-07-28: 1 mg via INTRAVENOUS

## 2019-07-28 MED ORDER — LACTATED RINGERS IV BOLUS
1000.0000 mL | Freq: Once | INTRAVENOUS | Status: AC
  Administered 2019-07-28: 18:00:00 1000 mL via INTRAVENOUS

## 2019-07-28 MED ORDER — FENOFIBRATE 54 MG PO TABS
54.0000 mg | ORAL_TABLET | Freq: Every day | ORAL | Status: DC
  Administered 2019-07-28 – 2019-07-30 (×3): 54 mg
  Filled 2019-07-28 (×5): qty 1

## 2019-07-28 MED ORDER — MAGNESIUM SULFATE 2 GM/50ML IV SOLN
2.0000 g | Freq: Once | INTRAVENOUS | Status: AC
  Administered 2019-07-28: 07:00:00 2 g via INTRAVENOUS
  Filled 2019-07-28: qty 50

## 2019-07-28 MED ORDER — SODIUM CHLORIDE 0.9 % IV SOLN
0.0000 ug/min | INTRAVENOUS | Status: DC
  Administered 2019-07-28: 09:00:00 25 ug/min via INTRAVENOUS
  Administered 2019-07-28: 18:00:00 10 ug/min via INTRAVENOUS
  Administered 2019-07-28: 11:00:00 25 ug/min via INTRAVENOUS
  Administered 2019-07-29: 06:00:00 15 ug/min via INTRAVENOUS
  Administered 2019-07-30: 20 ug/min via INTRAVENOUS
  Filled 2019-07-28: qty 1
  Filled 2019-07-28 (×2): qty 10
  Filled 2019-07-28: qty 1
  Filled 2019-07-28: qty 10
  Filled 2019-07-28: qty 1
  Filled 2019-07-28: qty 10

## 2019-07-28 MED ORDER — GLUCAGON HCL RDNA (DIAGNOSTIC) 1 MG IJ SOLR
1.0000 mg | Freq: Once | INTRAMUSCULAR | Status: AC
  Administered 2019-07-28: 1 mg via INTRAVENOUS
  Filled 2019-07-28: qty 1

## 2019-07-28 MED ORDER — LACTATED RINGERS IV BOLUS
1000.0000 mL | Freq: Once | INTRAVENOUS | Status: AC
  Administered 2019-07-28: 08:00:00 1000 mL via INTRAVENOUS

## 2019-07-28 MED ORDER — DEXTROSE 50 % IV SOLN
50.0000 mL | Freq: Once | INTRAVENOUS | Status: AC
  Administered 2019-07-28: 50 mL via INTRAVENOUS

## 2019-07-28 MED ORDER — SODIUM CHLORIDE 0.9% FLUSH
10.0000 mL | Freq: Two times a day (BID) | INTRAVENOUS | Status: DC
  Administered 2019-07-28: 14:00:00 20 mL
  Administered 2019-07-28 – 2019-07-31 (×5): 10 mL
  Administered 2019-08-01: 10:00:00 30 mL
  Administered 2019-08-01: 21:00:00 10 mL
  Administered 2019-08-02: 30 mL
  Administered 2019-08-02: 21:00:00 10 mL
  Administered 2019-08-03: 09:00:00 20 mL
  Administered 2019-08-03 – 2019-08-06 (×6): 10 mL

## 2019-07-28 MED ORDER — POTASSIUM CHLORIDE 10 MEQ/100ML IV SOLN
10.0000 meq | INTRAVENOUS | Status: AC
  Administered 2019-07-28 (×5): 10 meq via INTRAVENOUS
  Filled 2019-07-28 (×5): qty 100

## 2019-07-28 MED ORDER — SODIUM CHLORIDE 0.9% FLUSH: 10.0000 mL | INTRAVENOUS | Status: DC | PRN

## 2019-07-28 NOTE — Progress Notes (Signed)
Patients had a brief bradycardic episode with sats teetering around 89-90%. FiO2 increased to 35%. Patient also breath stacking on vent, increased VT to and patient looked more comfortable with this setting, not breath stacking as much.

## 2019-07-28 NOTE — Progress Notes (Signed)
Pharmacy Electrolyte Monitoring Consult:  Pharmacy consulted to assist in monitoring and replacing electrolytes in this 49 y.o. male admitted on 07/27/2019 with Altered Mental Status. Patient is currently intubated and sedated on Fentanyl and Propofol infusions. Patient with past medical history significant for Hepatitis C, Alcohol abuse, hepatic encephalopathy.   Patient with epistaxis secondary to COVID swab. Patient being evaluated for GI Bleed.   Labs:  Sodium (mmol/L)  Date Value  07/28/2019 143  10/13/2012 133 (L)   Potassium (mmol/L)  Date Value  07/28/2019 3.0 (L)  10/13/2012 5.0   Magnesium (mg/dL)  Date Value  25/36/6440 1.7   Phosphorus (mg/dL)  Date Value  34/74/2595 4.2   Calcium (mg/dL)  Date Value  63/87/5643 7.5 (L)   Calcium, Total (mg/dL)  Date Value  32/95/1884 8.2 (L)   Albumin (g/dL)  Date Value  16/60/6301 3.3 (L)  10/13/2012 4.0    Assessment/Plan: 1. Electrolytes: received magnesium 2 g IV x 1, potassium 10 mEq IV x 5 in progress. Will add potassium 20 mEq per tube x 1 dose. Will replace to maintain Potassium ~ 4, Magnesium ~2, and phosphorus 2.5-3.   2. Glucose: no history of diabetes noted. Will monitor via BMPs and initiate SSI as appropriate.   3. Constipation: patient being evaluated for possible GI bleed and is NPO. Will continue to monitor.   4. Tube medications: patient currently NPO. Will continue to monitor.   Pharmacy will continue to monitor and adjust per consult.    Pricilla Riffle, PharmD 07/28/2019 8:14 AM

## 2019-07-28 NOTE — Progress Notes (Signed)
RT assisted with patient transport from ICU to CT and back with no complications while Pt being ventilated with Trilogy transport ventilator.

## 2019-07-28 NOTE — Procedures (Signed)
Endotracheal Intubation: Patient required placement of an artificial airway secondary to lethargy with severe epistaxis and hematemesis and aspiration of blood.   Consent: Emergent.   Hand washing performed prior to starting the procedure.   Medications administered for sedation prior to procedure:  Midazolam 2 mg IV,  Rocuronium 50 mg IV, Fentanyl 100 mcg IV.    A time out procedure was called and correct patient, name, & ID confirmed. Needed supplies and equipment were assembled and checked to include ETT, 10 ml syringe, Glidescope, Mac and Miller blades, suction, oxygen and bag mask valve, end tidal CO2 monitor.   Patient was positioned to align the mouth and pharynx to facilitate visualization of the glottis.   Heart rate, SpO2 and blood pressure was continuously monitored during the procedure. Pre-oxygenation was conducted prior to intubation and endotracheal tube was placed through the vocal cords into the trachea.     The artificial airway was placed under direct visualization via glidescope route using a 7 ETT on the first attempt.  ETT was secured at 23 cm mark.  Placement was confirmed by auscuitation of lungs with good breath sounds bilaterally and no stomach sounds.  Condensation was noted on endotracheal tube.   Pulse ox 98%.  CO2 detector in place with appropriate color change.   Complications: None .    Chest radiograph ordered and pending.   Comments: OGT placed via glidescope.   Ottie Glazier, M.D.  Pulmonary & [place]

## 2019-07-28 NOTE — Progress Notes (Signed)
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 His LFT's are almost back to within normal limits, and ammonia is trending downward as are triglycerides.  Currently there are no tentative plans for EGD as bleeding felt to be from epistaxis due to trauma from COVID swab.  All questions answered, Marvia Pickles is very appreciative of the update.    Harlon Ditty, AGACNP-BC [facility] Pager: [phone]

## 2019-07-28 NOTE — Progress Notes (Signed)
Warm blankets  

## 2019-07-28 NOTE — Progress Notes (Signed)
Dear Doctor: This patient has been identified as a candidate for PICC for the following reason (s): poor veins/poor circulatory system (CHF, COPD, emphysema, diabetes, steroid use, IV drug abuse, etc.), restarts due to phlebitis and infiltration in 24 hours and incompatible drugs (aminophyllin, TPN, heparin, given with an antibiotic) If you agree, please write an order for the indicated device. For any questions contact the Vascular Access Team at [phone] if no answer, please leave a message.  Thank you for supporting the early vascular access assessment program.

## 2019-07-28 NOTE — Progress Notes (Signed)
RN called RT to patient bedside for possible self extubation. ETT previously secured at 24cm. Upon arrival to bedside ETT was found at 20cm. Tube holder was dislodged from Right side of patient's face as well. Patient able to maintain adequate tidal volumes but was placed on 100% FIO2 due to SATs beginning to fall into upper 80s. Glidescope used for visual confirmation of intact airway. ETT advanced to 22cm. STAT CXR ordered. Per NP and CXR results ETT advanced to 25cm and secured with new tube holder. Secondary CXR confirmed satisfactory placement of ETT, approximately 4.2cm above carina. Patient FIO2 weaned back down to 40%. Ventilator check performed. Patient suctioned with moderate amount of thick pink tinged secretions returned. RN at bedside. Will continue to monitor.

## 2019-07-28 NOTE — Consult Note (Signed)
GI Inpatient Consult Note  Reason for Consult: Questionable GI Bleed   Attending Requesting Consult: Dr. Ottie Glazier  History of Present Illness: Joel SIEGERT Sr. is a 49 y.o. male seen for evaluation of possible GI bleed at the request of Dr. Lanney Gins. Pt has a PMH of Hepatitic C, tobacco and alcohol abuse, Hx of seizures, GERD, HLD, and Hx of hepatic encephalopathy requiring intubation in the past. He presented to the ED for altered mental status. Pt was apparently at work yesterday but was confused and acting abnormally so he was taken to the ED for further evaluation. Upon arrival to the ED, ammonia was elevated at 302, EtOH 104, WBC 7.8, AST 70, ALT 54 , tbili 0.9, alk phos 75, tachycardic, and otherwise normal vital signs. Patient was severely agitated in the ED and restless. Per nursing reports, patient did not tolerate the COVID swab and this subsequently caused epistaxis. Upon arrival to the stepdown unit, patient was actively bleeding from his nose and mouth covering the bed and surrounding floor and the decision was made to intubate the patient to protect his airway and for presumed aspiration. He was placed on Protonix and Octreotide infusions along with Rocephin. Hemoglobin dropped from 13 to 10, but he is currently hemodynamically stable. GI was consulted for possible GI bleed versus epistaxis.   Patient is examined this morning and currently intubated and sedated. There has been no ongoing signs of overt gastrointestinal blood loss. No acute events overnight. He was hypotensive this morning and receiving phenylephrine for stabilization. He has been receiving lactulose 30 cc per rectum, and ammonia 55 this morning. There is a long history of alcohol and substance abuse. He has been hospitalized for hepatic encephalopathy previously - 05/30/2019, 05/13/2019, 03/31/2019, 07/2015.    Last EGD/Colonoscopy: N/A    Past Medical History:  Past Medical History:  Diagnosis Date  .  Chronic alcohol abuse   . Elevated transaminase level   . Hepatitis C   . Seizure ([place])   . Tobacco use     Problem List: Patient Active Problem List   Diagnosis Date Noted  . HLD (hyperlipidemia) 07/27/2019  . GERD (gastroesophageal reflux disease) 07/27/2019  . Alcohol abuse 07/27/2019  . Tobacco abuse 07/27/2019  . Alcoholic intoxication with complication ([place])   . Hypokalemia   . Seizures ([place]) 05/30/2019  . Hypothermia 05/13/2019  . Substance abuse ([place]) 04/01/2019  . Coagulopathy ([place]) 04/01/2019  . Alcohol use disorder, moderate, dependence ([place]) 04/01/2019  . Irritability 04/01/2019  . Acute hepatic encephalopathy 07/22/2015    Past Surgical History: Past Surgical History:  Procedure Laterality Date  . CARPAL TUNNEL RELEASE      Allergies: No Known Allergies  Home Medications: Medications Prior to Admission  Medication Sig Dispense Refill Last Dose  . atorvastatin (LIPITOR) 40 MG tablet Take 40 mg by mouth daily.   Unknown at Unknown  . B Complex-Biotin-FA (B-100 B-COMPLEX PO) Take 1 tablet by mouth daily.   Unknown at Unknown  . disulfiram (ANTABUSE) 250 MG tablet Take 250 mg by mouth daily.   Unknown at Unknown  . omeprazole (PRILOSEC) 20 MG capsule Take 20 mg by mouth daily as needed (acid reflux).    Unknown at PRN  . tadalafil (CIALIS) 20 MG tablet Take 20 mg by mouth daily as needed for erectile dysfunction.   Unknown at PRN  . venlafaxine XR (EFFEXOR-XR) 150 MG 24 hr capsule Take 150 mg by mouth daily.   Unknown at Unknown  . folic  acid (FOLVITE) 1 MG tablet Take 1 tablet (1 mg total) by mouth daily. (Patient not taking: Reported on 07/27/2019) 30 tablet 0 Not Taking at Unknown time   Home medication reconciliation was completed with the patient.   Scheduled Inpatient Medications:   . chlorhexidine gluconate (MEDLINE KIT)  15 mL Mouth Rinse BID  . Chlorhexidine Gluconate Cloth  6 each Topical Q0600  . fenofibrate  54 mg Per Tube Daily  . fentaNYL  (SUBLIMAZE) injection  50 mcg Intravenous Once  . folic acid  1 mg Intravenous Daily  . lactulose  30 g Per Tube TID  . LORazepam  0-4 mg Intravenous Q6H   Followed by  . [START ON 07/29/2019] LORazepam  0-4 mg Intravenous Q12H  . mouth rinse  15 mL Mouth Rinse 10 times per day  . multivitamin  15 mL Per Tube Daily  . [START ON 07/31/2019] pantoprazole  40 mg Intravenous Q12H  . potassium chloride  20 mEq Per Tube Once  . [START ON 08/01/2019] thiamine  100 mg Per Tube Daily    Continuous Inpatient Infusions:   . cefTRIAXone (ROCEPHIN)  IV 2 g (07/27/19 2118)  . dexmedetomidine (PRECEDEX) IV infusion 0.7 mcg/kg/hr (07/28/19 0832)  . dextrose 100 mL/hr at 07/28/19 0747  . fentaNYL infusion INTRAVENOUS 275 mcg/hr (07/28/19 0818)  . lactated ringers 50 mL/hr at 07/27/19 2316  . midazolam 3 mg/hr (07/28/19 0749)  . octreotide  (SANDOSTATIN)    IV infusion 50 mcg/hr (07/28/19 0749)  . pantoprozole (PROTONIX) infusion 8 mg/hr (07/28/19 0749)  . phenylephrine (NEO-SYNEPHRINE) Adult infusion    . potassium chloride 10 mEq (07/28/19 0749)  . thiamine injection 500 mg (07/27/19 2000)    PRN Inpatient Medications:  fentaNYL, LORazepam, LORazepam **OR** LORazepam  Family History: family history is not on file.  The patient's family history is negative for inflammatory bowel disorders, GI malignancy, or solid organ transplantation.  Social History:   reports that he has been smoking cigars. He has been smoking about 6.00 packs per day. He has never used smokeless tobacco. He reports current alcohol use of about 6.0 standard drinks of alcohol per week. He reports current drug use. Drug: Marijuana. The patient denies ETOH, tobacco, or drug use.   Review of Systems:  Unable to obtain due to pt intubated and sedated   Physical Examination: BP (!) 83/56   Pulse 68   Temp 98.4 F (36.9 C) (Core)   Resp 15   Ht 5' 7.99" (1.727 m)   Wt 67.7 kg   SpO2 99%   BMI 22.70 kg/m  Gen: Intubated and  sedated HEENT: PEERLA, EOMI, Neck: supple, no JVD or thyromegaly Chest: Decreased breath sounds bilaterally CV: RRR, no m/g/c/r Abd: soft, NT, ND, +BS in all four quadrants; no HSM, guarding, ridigity, or rebound tenderness Ext: no edema, well perfused with 2+ pulses, Skin: no rash or lesions noted Lymph: no LAD  Data: Lab Results  Component Value Date   WBC 11.7 (H) 07/28/2019   HGB 10.0 (L) 07/28/2019   HCT 29.5 (L) 07/28/2019   MCV 94.9 07/28/2019   PLT 178 07/28/2019   Recent Labs  Lab 07/27/19 1929 07/27/19 2359 07/28/19 0503  HGB 11.0* 10.0* 10.0*   Lab Results  Component Value Date   NA 143 07/28/2019   K 3.0 (L) 07/28/2019   CL 117 (H) 07/28/2019   CO2 19 (L) 07/28/2019   BUN 14 07/28/2019   CREATININE 0.81 07/28/2019   Lab Results  Component Value  Date   ALT 37 07/28/2019   AST 48 (H) 07/28/2019   ALKPHOS 47 07/28/2019   BILITOT 0.8 07/28/2019   Recent Labs  Lab 07/27/19 1929 07/27/19 2359  APTT  --  39*  INR 1.2  --    Assessment/Plan:  49 y/o Caucasian male with a PMH of Hepatitic C, tobacco and alcohol abuse, Hx of seizures, GERD, HLD, and Hx of hepatic encephalopathy requiring intubation in the past who was admitted for altered mental status  1. Acute hepatic encephalopathy - ammonia on admission 302 2. Acute hypoxic respiratory failure s/p intubation 3. UGI bleed vs Epistaxis  4. Transaminitis - likely 2/2 alcohol abuse  - Pt currently intubated and sedated due to acute hypoxic respiratory failure 2/2 multiple aspiration of bloody emesis - No signs of overt gastrointestinal blood loss. He is hemodynamically stable. Hemoglobin 10.0 this morning. - Bleeding most likely 2/2 epistaxis and oropharyngeal trauma - Continue acid suppression and Octreotide for at least 72 hours - Continue to monitor H&H and transfuse as necessary for Hgb <7.0. - Continue Ammonia 30 cc TID per rectum for hepatic encephalopathy - Currently no plans for endoscopy as  there is no overt bleeding - Due to multiple admissions for hepatic encephalopathy, pt is at high risk for poor long term survival due to ongoing alcohol and substance abuse - Following along with you. Please call Dr. Alice Reichert immediately for any hemodynamic changes or precipitous drop in hemoglobin.    Thank you for the consult. Please call with questions or concerns.  Reeves Forth [facility] [phone] (Cell)

## 2019-07-28 NOTE — Care Plan (Signed)
With sedation lightened, patient was beginning to cough. RN attempted to suction ET tube as O2 sats dropped to 83. This resulted in a bradycardic episode with HR trending down to 26. Increased to 100% on vent, precedex stopped, versed increased. HR returned to 60 SR. MD at bedside. CT trip delayed until patient stabilizes.

## 2019-07-28 NOTE — Consult Note (Signed)
CRITICAL CARE PROGRESS NOTE    Name: Joel FREESE Sr. MRN: [id number] DOB: 1970/09/12     LOS: 1   SUBJECTIVE FINDINGS & SIGNIFICANT EVENTS   Patient description:   Joel FITZSIMMONS Sr. is a 49 y.o. male with medical history significant of hepatitis C, tobacco abuse, alcohol abuse,cirrhosis, seizures, GERD, hyperlipidemia, hepatic encephalopathy requiring intubation in the past, who presents with altered mental status.Per EDP, pt's mother reported that patient was at work today, but was altered, acting abnormally and appeared quite confused.  On arrival pt was encephalopathic with active epistaxis and hematemesis  Pt with elevated venous ammonia and etoh levels.    Lines / Drains: PIV x2  Cultures / Sepsis markers: Blood cultures  Antibiotics: Rocephin  Protocols / Consultants: GI consultation  Tests / Events: KUB, CXR  Overnight: Patient was actively exsanguinating from his nose and mouth on arrival covering the entire bed and large area of floor with blood and required emergent intubation.   PAST MEDICAL HISTORY   Past Medical History:  Diagnosis Date  . Chronic alcohol abuse   . Elevated transaminase level   . Hepatitis C   . Seizure ([place])   . Tobacco use      SURGICAL HISTORY   Past Surgical History:  Procedure Laterality Date  . CARPAL TUNNEL RELEASE       FAMILY HISTORY   No family history on file.   SOCIAL HISTORY   Social History   Tobacco Use  . Smoking status: Current Every Day Smoker    Packs/day: 6.00    Types: Cigars  . Smokeless tobacco: Never Used  Substance Use Topics  . Alcohol use: Yes    Alcohol/week: 6.0 standard drinks    Types: 6 Cans of beer per week    Comment: 18  . Drug use: Yes    Types: Marijuana     MEDICATIONS   Current  Medication:  Current Facility-Administered Medications:  .  cefTRIAXone (ROCEPHIN) 2 g in sodium chloride 0.9 % 100 mL IVPB, 2 g, Intravenous, Q24H, Darel Hong D, NP, Last Rate: 200 mL/hr at 07/27/19 2118, 2 g at 07/27/19 2118 .  chlorhexidine gluconate (MEDLINE KIT) (PERIDEX) 0.12 % solution 15 mL, 15 mL, Mouth Rinse, BID, Lanney Gins, Arnisha Laffoon, MD, 15 mL at 07/28/19 0747 .  Chlorhexidine Gluconate Cloth 2 % PADS 6 each, 6 each, Topical, Q0600, Lanney Gins, Terianne Thaker, MD .  dexmedetomidine (PRECEDEX) 400 MCG/100ML (4 mcg/mL) infusion, 0.4-1.2 mcg/kg/hr, Intravenous, Titrated, Kiona Blume, MD, Last Rate: 11.85 mL/hr at 07/28/19 0913, 0.7 mcg/kg/hr at 07/28/19 0913 .  dextrose 10 % infusion, , Intravenous, Continuous, Darel Hong D, NP, Last Rate: 100 mL/hr at 07/28/19 0747, Rate Change at 07/28/19 0747 .  fenofibrate tablet 54 mg, 54 mg, Per Tube, Daily, Darel Hong D, NP, 54 mg at 07/28/19 0839 .  fentaNYL (SUBLIMAZE) bolus via infusion 50 mcg, 50 mcg, Intravenous, Q15 min PRN, Lanney Gins, Zakhai Meisinger, MD .  fentaNYL (SUBLIMAZE) injection 50 mcg, 50 mcg, Intravenous, Once, Kenzlei Runions, MD .  fentaNYL 2547mg in NS 2565m(101mml) infusion-PREMIX, 0-400 mcg/hr, Intravenous, Continuous, Naelani Lafrance, MD, Last Rate: 25 mL/hr at 07/28/19 0913, 250 mcg/hr at 07/28/19 0913 .  folic acid injection 1 mg, 1 mg, Intravenous, Daily, AleOttie GlazierD, 1 mg at 07/28/19 [id number] lactated ringers infusion, , Intravenous, Continuous, KeeDarel Hong NP, Last Rate: 50 mL/hr at 07/27/19 2316, New Bag at 07/27/19 2316 .  lactulose (CHRONULAC) 10 GM/15ML solution 30 g, 30 g, Per  Tube, TID, Ottie Glazier, MD, 30 g at 07/28/19 4709 .  LORazepam (ATIVAN) injection 0-4 mg, 0-4 mg, Intravenous, Q6H **FOLLOWED BY** [START ON 07/29/2019] LORazepam (ATIVAN) injection 0-4 mg, 0-4 mg, Intravenous, Q12H, Ivor Costa, MD .  LORazepam (ATIVAN) injection 1 mg, 1 mg, Intravenous, Q2H PRN, Ivor Costa, MD .  LORazepam  (ATIVAN) tablet 1-4 mg, 1-4 mg, Oral, Q1H PRN **OR** LORazepam (ATIVAN) injection 1-4 mg, 1-4 mg, Intravenous, Q1H PRN, Ivor Costa, MD .  MEDLINE mouth rinse, 15 mL, Mouth Rinse, 10 times per day, Ottie Glazier, MD, 15 mL at 07/28/19 0957 .  midazolam (VERSED) 50 mg/50 mL (1 mg/mL) premix infusion, 0.5-5 mg/hr, Intravenous, Titrated, Darel Hong D, NP, Last Rate: 2 mL/hr at 07/28/19 0913, 2 mg/hr at 07/28/19 0913 .  multivitamin liquid 15 mL, 15 mL, Per Tube, Daily, Ottie Glazier, MD, 15 mL at 07/28/19 1006 .  octreotide (SANDOSTATIN) 500 mcg in sodium chloride 0.9 % 250 mL (2 mcg/mL) infusion, 50 mcg/hr, Intravenous, Continuous, Hiawatha Merriott, MD, Last Rate: 25 mL/hr at 07/28/19 0913, 50 mcg/hr at 07/28/19 0913 .  pantoprazole (PROTONIX) 80 mg in sodium chloride 0.9 % 250 mL (0.32 mg/mL) infusion, 8 mg/hr, Intravenous, Continuous, Darel Hong D, NP, Last Rate: 25 mL/hr at 07/28/19 0913, 8 mg/hr at 07/28/19 0913 .  [START ON 07/31/2019] pantoprazole (PROTONIX) injection 40 mg, 40 mg, Intravenous, Q12H, Darel Hong D, NP .  phenylephrine (NEO-SYNEPHRINE) 10 mg in sodium chloride 0.9 % 250 mL (0.04 mg/mL) infusion, 0-400 mcg/min, Intravenous, Titrated, Darel Hong D, NP, Last Rate: 37.5 mL/hr at 07/28/19 0909, 25 mcg/min at 07/28/19 0909 .  potassium chloride (KLOR-CON) packet 20 mEq, 20 mEq, Per Tube, Once, Sreenath, Sudheer B, MD .  potassium chloride 10 mEq in 100 mL IVPB, 10 mEq, Intravenous, Q1 Hr x 5, Darel Hong D, NP, Last Rate: 100 mL/hr at 07/28/19 1019, 10 mEq at 07/28/19 1019 .  thiamine 555m in normal saline (537m IVPB, 500 mg, Intravenous, Daily, SiCharlett NoseRPH, Last Rate: 100 mL/hr at 07/28/19 1004, 500 mg at 07/28/19 1004 .  [START ON 08/01/2019] thiamine tablet 100 mg, 100 mg, Per Tube, Daily, SiCharlett NoseRPMarshfield [facility]tr [facility]  ALLERGIES   Patient has no known allergies.    REVIEW OF SYSTEMS     Unable to obtain as patient is poorly responsive with  severely altered sensorium  PHYSICAL EXAMINATION   Vital Signs: Temp:  [95.4 F (35.2 C)-99.1 F (37.3 C)] 98.4 F (36.9 C) (02/27 0730) Pulse Rate:  [61-121] 64 (02/27 1000) Resp:  [9-20] 15 (02/27 1000) BP: (75-211)/(53-126) 113/80 (02/27 1000) SpO2:  [96 %-100 %] 99 % (02/27 1000) FiO2 (%):  [30 %-45 %] 30 % (02/27 0915) Weight:  [67.7 kg-69.6 kg] 67.7 kg (02/26 1800)  GENERAL: acutely ill appearing HEAD: Normocephalic, atraumatic.  EYES: Pupils equal, round, reactive to light.  No scleral icterus.  MOUTH: Moist mucosal membrane. NECK: Supple. No thyromegaly. No nodules. No JVD.  PULMONARY: Decreased breath sounds bilaterally CARDIOVASCULAR: S1 and S2. Regular rate and rhythm. No murmurs, rubs, or gallops.  GASTROINTESTINAL: Soft, nontender, non-distended. No masses. Positive bowel sounds. No hepatosplenomegaly.  MUSCULOSKELETAL: No swelling, clubbing, or edema.  NEUROLOGIC: Rass -2  SKIN:intact,warm,dry   PERTINENT DATA     Infusions: . cefTRIAXone (ROCEPHIN)  IV 2 g (07/27/19 2118)  . dexmedetomidine (PRECEDEX) IV infusion 0.7 mcg/kg/hr (07/28/19 0913)  . dextrose 100 mL/hr at 07/28/19 0747  . fentaNYL infusion INTRAVENOUS 250 mcg/hr (07/28/19 0913)  . lactated  ringers 50 mL/hr at 07/27/19 2316  . midazolam 2 mg/hr (07/28/19 0913)  . octreotide  (SANDOSTATIN)    IV infusion 50 mcg/hr (07/28/19 0913)  . pantoprozole (PROTONIX) infusion 8 mg/hr (07/28/19 0913)  . phenylephrine (NEO-SYNEPHRINE) Adult infusion 25 mcg/min (07/28/19 0909)  . potassium chloride 10 mEq (07/28/19 1019)  . thiamine injection 500 mg (07/28/19 1004)   Scheduled Medications: . chlorhexidine gluconate (MEDLINE KIT)  15 mL Mouth Rinse BID  . Chlorhexidine Gluconate Cloth  6 each Topical Q0600  . fenofibrate  54 mg Per Tube Daily  . fentaNYL (SUBLIMAZE) injection  50 mcg Intravenous Once  . folic acid  1 mg Intravenous Daily  . lactulose  30 g Per Tube TID  . LORazepam  0-4 mg Intravenous  Q6H   Followed by  . [START ON 07/29/2019] LORazepam  0-4 mg Intravenous Q12H  . mouth rinse  15 mL Mouth Rinse 10 times per day  . multivitamin  15 mL Per Tube Daily  . [START ON 07/31/2019] pantoprazole  40 mg Intravenous Q12H  . potassium chloride  20 mEq Per Tube Once  . [START ON 08/01/2019] thiamine  100 mg Per Tube Daily   PRN Medications: fentaNYL, LORazepam, LORazepam **OR** LORazepam Hemodynamic parameters:   Intake/Output: 02/26 0701 - 02/27 0700 In: 3577.7 [I.V.:2143.2; IV Piggyback:1434.5] Out: 1400 [Urine:1100; Emesis/NG output:300]  Ventilator  Settings: Vent Mode: PRVC FiO2 (%):  [30 %-45 %] 30 % Set Rate:  [16 bmp] 16 bmp Vt Set:  [500 mL] 500 mL PEEP:  [5 cmH20] 5 cmH20 Plateau Pressure:  [8 cmH20] 8 cmH20    LAB RESULTS:  Basic Metabolic Panel: Recent Labs  Lab 07/27/19 1300 07/27/19 1300 07/27/19 1929 07/28/19 0503  NA 138  --  140 143  K 4.0   < > 4.3 3.0*  CL 106  --  113* 117*  CO2 20*  --  19* 19*  GLUCOSE 119*  --  118* 119*  BUN 14  --  15 14  CREATININE 0.81  --  0.81 0.81  CALCIUM 9.2  --  7.8* 7.5*  MG 2.1  --   --  1.7  PHOS 2.4*  --   --  4.2   < > = values in this interval not displayed.   Liver Function Tests: Recent Labs  Lab 07/27/19 1300 07/27/19 1929 07/28/19 0503  AST 70* 54* 48*  ALT 54* 46* 37  ALKPHOS 75 66 47  BILITOT 0.9 0.7 0.8  PROT 8.0 6.5 6.0*  ALBUMIN 4.3 3.6 3.3*   Recent Labs  Lab 07/27/19 1937  LIPASE 23  AMYLASE 80   Recent Labs  Lab 07/27/19 1300 07/28/19 0503  AMMONIA 302* 55*   CBC: Recent Labs  Lab 07/27/19 1300 07/27/19 1929 07/27/19 2359 07/28/19 0503  WBC 7.8 8.9  --  11.7*  NEUTROABS 4.3  --   --   --   HGB 13.3 11.0* 10.0* 10.0*  HCT 37.3* 31.7* 28.9* 29.5*  MCV 91.0 93.2  --  94.9  PLT 262 211  --  178   Cardiac Enzymes: No results for input(s): CKTOTAL, CKMB, CKMBINDEX, TROPONINI in the last 168 hours. BNP: Invalid input(s): POCBNP CBG: Recent Labs  Lab 07/28/19 0647  07/28/19 0743 07/28/19 0837 07/28/19 0936 07/28/19 1025  GLUCAP 130* 118* 126* 154* 164*     IMAGING RESULTS:  Imaging: DG Chest Port 1 View  Result Date: 07/28/2019 CLINICAL DATA:  Acute respiratory failure, intubated EXAM: PORTABLE CHEST 1 VIEW  COMPARISON:  07/27/2019 FINDINGS: Single frontal view of the chest demonstrates stable position of the endotracheal tube and enteric catheters. The cardiac silhouette is unchanged. Stable diffuse interstitial prominence without airspace disease, effusion, or pneumothorax. There is mild central vascular congestion. IMPRESSION: 1. Central vascular congestion without superimposed airspace disease. 2. Stable support devices. Electronically Signed   By: Randa Ngo M.D.   On: 07/28/2019 03:20   DG Chest Port 1 View  Result Date: 07/27/2019 CLINICAL DATA:  Intubated, hepatic encephalopathy EXAM: PORTABLE CHEST 1 VIEW COMPARISON:  05/31/2019 FINDINGS: Single frontal view of the chest demonstrates endotracheal tube overlying tracheal air column tip just below thoracic inlet. Enteric catheter tip and side port project over gastric body. Cardiac silhouette is unremarkable. Diffuse interstitial prominence throughout the lungs consistent with history of tobacco abuse, stable. No airspace disease, effusion, or pneumothorax. IMPRESSION: 1. Chronic interstitial lung disease, no acute process. 2. No complication after intubation. Electronically Signed   By: Randa Ngo M.D.   On: 07/27/2019 19:44   DG Abd Portable 1V  Result Date: 07/27/2019 CLINICAL DATA:  Enteric catheter placement, hepatic encephalopathy EXAM: PORTABLE ABDOMEN - 1 VIEW COMPARISON:  None. FINDINGS: Supine frontal view of the lower chest and upper abdomen demonstrates enteric catheter tip and side port projecting over gastric body. Bowel gas pattern is unremarkable. No bowel obstruction or ileus. No masses or abnormal calcifications. Lung bases are clear. IMPRESSION: 1. Enteric catheter  overlying gastric body. Electronically Signed   By: Randa Ngo M.D.   On: 07/27/2019 19:43   DG Hand Complete Right  Result Date: 07/27/2019 CLINICAL DATA:  Right hand injury EXAM: RIGHT HAND - COMPLETE 3+ VIEW COMPARISON:  05/30/2019 FINDINGS: Frontal, oblique, and lateral views of the right hand are obtained. Prior healed distal right radial fracture with resulting dorsal angulation of the radiocarpal joint unchanged. No acute displaced fractures. Likely previous healed fracture distal aspect right fifth metacarpal. There is radiocarpal joint space narrowing. Remaining joint spaces are relatively well preserved. Soft tissues appear normal. IMPRESSION: 1. Sequela of chronic healed distal right radial fracture, with stable dorsal angulation and radiocarpal osteoarthritis. 2. No acute bony abnormality of the right hand. Electronically Signed   By: Randa Ngo M.D.   On: 07/27/2019 19:46      ASSESSMENT AND PLAN    -Multidisciplinary rounds held today  Acute Hypoxic Respiratory Failure -Due to multiple episodes of aspiration of bloody vomitus -Status post emergent endotracheal intubation -continue Bronchodilator Therapy -Wean Fio2 and PEEP as tolerated -will perform SAT/SBT when respiratory parameters are met   Altered mental status with lethargy   -Likely due to hepatic encephalopathy    -Ammonia profoundly elevated, status post lactulose   Acute upper GI bleed -Protonix 40 mg twice daily -GI consultation urgently -Monitor H&H -Lactulose due to elevated venous ammonia levels with likely hepatic encephalopathy ICU telemetry monitoring  Transaminitis  -History of alcoholism/substance abuse   Moderate protein calorie malnutrition   -Hypophosphatemia-monitor for refeeding syndrome   History of alcoholism/substance abuse -Banana bag with thiamine and folate repletion -Patient is currently on propofol and fentanyl however still need to monitor for signs of  withdrawal -Street of seizure disorder  NEUROLOGY - intubated and sedated - minimal sedation to achieve a RASS goal: -1 Wake up assessment pending    ID -continue IV abx as prescibed -follow up cultures  GI/Nutrition GI PROPHYLAXIS as indicated DIET-->TF's as tolerated Constipation protocol as indicated  ENDO - ICU hypoglycemic\Hyperglycemia protocol -check FSBS per protocol   ELECTROLYTES -  follow labs as needed -replace as needed -pharmacy consultation   DVT/GI PRX ordered -SCDs  TRANSFUSIONS AS NEEDED MONITOR FSBS ASSESS the need for LABS as needed   Critical care provider statement:    Critical care time (minutes):  33   Critical care time was exclusive of:  Separately billable procedures and treating other patients   Critical care was necessary to treat or prevent imminent or life-threatening deterioration of the following conditions:   Altered mental status with lethargy, acute hypoxemic respiratory failure, upper GI bleed, hematemesis, substance abuse, alcoholism, transaminitis, hypophosphatemia, multiple comorbid conditions   Critical care was time spent personally by me on the following activities:  Development of treatment plan with patient or surrogate, discussions with consultants, evaluation of patient's response to treatment, examination of patient, obtaining history from patient or surrogate, ordering and performing treatments and interventions, ordering and review of laboratory studies and re-evaluation of patient's condition.  I assumed direction of critical care for this patient from another provider in my specialty: no    This document was prepared using Dragon voice recognition software and may include unintentional dictation errors.    Ottie Glazier, M.D.  Division of [facility]

## 2019-07-29 ENCOUNTER — Inpatient Hospital Stay: Payer: No Typology Code available for payment source

## 2019-07-29 LAB — CULTURE, RESPIRATORY W GRAM STAIN: Culture: NORMAL

## 2019-07-29 LAB — AMMONIA: Ammonia: 54 umol/L — ABNORMAL HIGH (ref 9–35)

## 2019-07-29 LAB — HEMOGLOBIN AND HEMATOCRIT, BLOOD
HCT: 30.2 % — ABNORMAL LOW (ref 39.0–52.0)
HCT: 30.5 % — ABNORMAL LOW (ref 39.0–52.0)
HCT: 31.6 % — ABNORMAL LOW (ref 39.0–52.0)
Hemoglobin: 10 g/dL — ABNORMAL LOW (ref 13.0–17.0)
Hemoglobin: 10.4 g/dL — ABNORMAL LOW (ref 13.0–17.0)
Hemoglobin: 9.9 g/dL — ABNORMAL LOW (ref 13.0–17.0)

## 2019-07-29 LAB — CBC
HCT: 30.8 % — ABNORMAL LOW (ref 39.0–52.0)
Hemoglobin: 10 g/dL — ABNORMAL LOW (ref 13.0–17.0)
MCH: 31.5 pg (ref 26.0–34.0)
MCHC: 32.5 g/dL (ref 30.0–36.0)
MCV: 97.2 fL (ref 80.0–100.0)
Platelets: 150 10*3/uL (ref 150–400)
RBC: 3.17 MIL/uL — ABNORMAL LOW (ref 4.22–5.81)
RDW: 14.6 % (ref 11.5–15.5)
WBC: 9.2 10*3/uL (ref 4.0–10.5)
nRBC: 0 % (ref 0.0–0.2)

## 2019-07-29 LAB — COMPREHENSIVE METABOLIC PANEL
ALT: 30 U/L (ref 0–44)
AST: 36 U/L (ref 15–41)
Albumin: 3 g/dL — ABNORMAL LOW (ref 3.5–5.0)
Alkaline Phosphatase: 65 U/L (ref 38–126)
Anion gap: 9 (ref 5–15)
BUN: 10 mg/dL (ref 6–20)
CO2: 16 mmol/L — ABNORMAL LOW (ref 22–32)
Calcium: 7.7 mg/dL — ABNORMAL LOW (ref 8.9–10.3)
Chloride: 112 mmol/L — ABNORMAL HIGH (ref 98–111)
Creatinine, Ser: 0.93 mg/dL (ref 0.61–1.24)
GFR calc Af Amer: >60 mL/min (ref >60)
GFR calc non Af Amer: >60 mL/min (ref >60)
Glucose, Bld: 116 mg/dL — ABNORMAL HIGH (ref 70–99)
Potassium: 3.9 mmol/L (ref 3.5–5.1)
Sodium: 137 mmol/L (ref 135–145)
Total Bilirubin: 0.7 mg/dL (ref 0.3–1.2)
Total Protein: 5.8 g/dL — ABNORMAL LOW (ref 6.5–8.1)

## 2019-07-29 LAB — GLUCOSE, CAPILLARY
Glucose-Capillary: 120 mg/dL — ABNORMAL HIGH (ref 70–99)
Glucose-Capillary: 125 mg/dL — ABNORMAL HIGH (ref 70–99)
Glucose-Capillary: 120 mg/dL — ABNORMAL HIGH (ref 70–99)
Glucose-Capillary: 113 mg/dL — ABNORMAL HIGH (ref 70–99)
Glucose-Capillary: 116 mg/dL — ABNORMAL HIGH (ref 70–99)
Glucose-Capillary: 130 mg/dL — ABNORMAL HIGH (ref 70–99)
Glucose-Capillary: 107 mg/dL — ABNORMAL HIGH (ref 70–99)
Glucose-Capillary: 105 mg/dL — ABNORMAL HIGH (ref 70–99)

## 2019-07-29 LAB — LEGIONELLA PNEUMOPHILA SEROGP 1 UR AG: L. pneumophila Serogp 1 Ur Ag: NEGATIVE

## 2019-07-29 LAB — PROCALCITONIN: Procalcitonin: 0.42 ng/mL

## 2019-07-29 LAB — MAGNESIUM: Magnesium: 1.9 mg/dL (ref 1.7–2.4)

## 2019-07-29 LAB — PHOSPHORUS: Phosphorus: 3 mg/dL (ref 2.5–4.6)

## 2019-07-29 LAB — TRIGLYCERIDES: Triglycerides: 348 mg/dL — ABNORMAL HIGH (ref <150)

## 2019-07-29 MED ORDER — MIDAZOLAM HCL 2 MG/2ML IJ SOLN
INTRAMUSCULAR | Status: AC
  Administered 2019-07-29: 10:00:00 2 mg
  Filled 2019-07-29: qty 2

## 2019-07-29 NOTE — Progress Notes (Signed)
GI Inpatient Follow-up Note  Subjective:  Patient seen in follow-up for hepatic encephalopathy and potential UGI bleed verus epistaxis. No further bleeding. Hemoglobin stable at 10.0. He is still intubated and sedated. He did have CT abd/pelvis wo contrast performed yesterday showing cirrhosis with hepatomegaly and nonspecific gallbladder wall thickening. Extubation trial planned for today.  Scheduled Inpatient Medications:  . chlorhexidine gluconate (MEDLINE KIT)  15 mL Mouth Rinse BID  . Chlorhexidine Gluconate Cloth  6 each Topical Q0600  . fenofibrate  54 mg Per Tube Daily  . fentaNYL (SUBLIMAZE) injection  50 mcg Intravenous Once  . folic acid  1 mg Intravenous Daily  . lactulose  30 g Per Tube TID  . LORazepam  0-4 mg Intravenous Q6H   Followed by  . LORazepam  0-4 mg Intravenous Q12H  . mouth rinse  15 mL Mouth Rinse 10 times per day  . multivitamin  15 mL Per Tube Daily  . [START ON 07/31/2019] pantoprazole  40 mg Intravenous Q12H  . sodium chloride flush  10-40 mL Intracatheter Q12H  . [START ON 08/01/2019] thiamine  100 mg Per Tube Daily    Continuous Inpatient Infusions:   . cefTRIAXone (ROCEPHIN)  IV Stopped (07/28/19 1932)  . dexmedetomidine (PRECEDEX) IV infusion Stopped (07/29/19 0811)  . dextrose 50 mL/hr at 07/28/19 1927  . fentaNYL infusion INTRAVENOUS Stopped (07/29/19 0810)  . lactated ringers 50 mL/hr at 07/29/19 0900  . midazolam Stopped (07/29/19 0742)  . octreotide  (SANDOSTATIN)    IV infusion 50 mcg/hr (07/29/19 0900)  . pantoprozole (PROTONIX) infusion 8 mg/hr (07/29/19 0900)  . phenylephrine (NEO-SYNEPHRINE) Adult infusion Stopped (07/29/19 0733)  . thiamine injection 500 mg (07/29/19 0919)    PRN Inpatient Medications:  fentaNYL, LORazepam, LORazepam **OR** LORazepam, sodium chloride flush  Review of Systems:  Unable to obtain due to patient's critical condition    Physical Examination: BP 95/60   Pulse 69   Temp 98.6 F (37 C)   Resp 16   Ht  5' 7.99" (1.727 m)   Wt 67.7 kg   SpO2 100%   BMI 22.70 kg/m  Gen: Intubated and sedated HEENT: PEERLA, EOMI, Neck: supple, no JVD or thyromegaly Chest: Decreased breath sounds bilaterally CV: RRR, no m/g/c/r Abd: soft, NT, ND, +BS in all four quadrants; no HSM, guarding, ridigity, or rebound tenderness Ext: no edema, well perfused with 2+ pulses, Skin: no rash or lesions noted Lymph: no LAD  Data: Lab Results  Component Value Date   WBC 9.2 07/29/2019   HGB 10.0 (L) 07/29/2019   HCT 30.8 (L) 07/29/2019   MCV 97.2 07/29/2019   PLT 150 07/29/2019   Recent Labs  Lab 07/28/19 1727 07/29/19 0006 07/29/19 0501  HGB 10.0* 9.9* 10.0*   Lab Results  Component Value Date   NA 137 07/29/2019   K 3.9 07/29/2019   CL 112 (H) 07/29/2019   CO2 16 (L) 07/29/2019   BUN 10 07/29/2019   CREATININE 0.93 07/29/2019   Lab Results  Component Value Date   ALT 30 07/29/2019   AST 36 07/29/2019   ALKPHOS 65 07/29/2019   BILITOT 0.7 07/29/2019   Recent Labs  Lab 07/27/19 1929 07/27/19 2359  APTT  --  39*  INR 1.2  --    Assessment/Plan:  49 y/o Caucasian male with a PMH of Hepatitic C, tobacco and alcohol abuse, Hx of seizures, GERD, HLD, and Hx of hepatic encephalopathy requiring intubation in the past who was admitted for altered mental status  2/2 hepatic encephalopathy    1. Decompensated alcoholic cirrhosis 2. Acute hypoxic respiratory failure s/p intubation 3. Epistaxis - GI bleed not evident 4. Transaminitis - likely 2/2 alcohol abuse   -CT abd/pelvis wo contrast yesterday showed evidence of mild cirrhosis with hepatomegaly. Likely 2/2 chronic alcoholism versus HCV -MELD 8, Child's Class B (8 pts) -Hepatic encephalopathy is improving with lactulose per rectum, but awaiting improvement of mental status. Extubation trial planned for this afternoon -Continue octreotide for another 24 hours, then discontinue -No evidence of GI bleed. Bleeding likely 2/2 epistaxis and  trauma from placement of COVID swab. Hemodynamically stable. -No plans for endoscopy at present -He will need to follow-up as an outpatient for discussion about cirrhosis and treatment moving forward to include variceal screening, ruling out other causes of liver disease (viral hepatitis, AIH, Wilson's, HH, etc) -Imperative that patient discontinue alcohol abuse as this is contributing to further decompensated liver disease -Following    Please call with questions or concerns.    Octavia Bruckner, PA-C [facility] [phone] (Cell)

## 2019-07-29 NOTE — Progress Notes (Signed)
Initial Nutrition Assessment  RD working remotely.   DOCUMENTATION CODES:   Not applicable  INTERVENTION:  - if patient unable to be extubated within the next 24 hours, recommend initiation of TF. - recommend Osmolite 1.5 @ 30 ml/hr to advance by 10 ml every 8 hours to reach goal rate of 50 ml/hr with 30 ml prostat once/day. - at goal rate, this regimen will provide 1900 kcal (98% estimated kcal need), 90 grams protein, and 914 ml free water.   Monitor magnesium, potassium, and phosphorus daily for at least 3 days, MD to replete as needed, as pt is at risk for refeeding syndrome given hx of alcohol abuse, unsure nutrition intake PTA.    NUTRITION DIAGNOSIS:   Inadequate oral intake related to inability to eat as evidenced by NPO status.  GOAL:   Patient will meet greater than or equal to 90% of their needs  MONITOR:   Vent status, Labs, Weight trends  REASON FOR ASSESSMENT:   Ventilator  ASSESSMENT:   49 y.o. male with medical history of hepatitis C, tobacco abuse, alcohol abuse, seizure, GERD, hyperlipidemia, and hepatic encephalopathy requiring intubation in the past. He presented to the ED with altered mental status. Patient's mother reported he was at work on 2/26 was altered, acting abnormally and appeared to be confused. In the ED, he was very confused, not following commands, and was restless.  Patient was intubated yesterday at ~1030. OGT in place. TF recommendations outlined above should patient remain intubated and require TF. Per chart review, weight on 2/26 was 149 lb and weight on 06/03/19 was 153 lb. This indicates 4 lb weight loss (2.6% body weight) in the past 1.5 months; not significant for time frame.   Per notes: - acute hypoxic respiratory failure d/t multiple episodes of aspiration of bloody emesis - failed spontaneous breathing trial this AM with hopeful plan to re-try later today - CT abd/pelvis showed mild cirrhosis and hepatomegaly - acute UGIB -  moderate protein calorie malnutrition   Patient is currently intubated on ventilator support MV: 9.8 L/min Temp (24hrs), Avg:98.4 F (36.9 C), Min:97 F (36.1 C), Max:100.9 F (38.3 C) Propofol: none   Labs reviewed; CBGs: 120, 125, 120, 113 mg/dl, Cl: 397 mmol/l, Ca: 7.7 mg/dl, ammonia: 54 umol/l. Medications reviewed; 1 mg folic acid/day, 30 g lactulose per tube TID, 15 ml multivitamin/day, 20 mEq Klor-Con x1 dose 2/27, 10 mEq IV KCl x5 runs 2/27, 500 mg IV thiamine x5, 100 mg thiamine per tube/day.  IVF; D10 @ 50 (408 kcal).    NUTRITION - FOCUSED PHYSICAL EXAM:  unable to complete at this time.   Diet Order:   Diet Order            Diet NPO time specified  Diet effective now              EDUCATION NEEDS:   No education needs have been identified at this time  Skin:  Skin Assessment: Reviewed RN Assessment  Last BM:  PTA/unknown  Height:   Ht Readings from Last 1 Encounters:  07/28/19 5' 7.99" (1.727 m)    Weight:   Wt Readings from Last 1 Encounters:  07/27/19 67.7 kg    Ideal Body Weight:  70 kg  BMI:  Body mass index is 22.7 kg/m.  Estimated Nutritional Needs:   Kcal:  1942 kcal  Protein:  81-101 grams  Fluid:  >/= 2 L/day     Trenton Gammon, MS, RD, LDN, CNSC Inpatient Clinical Dietitian RD  pager # available in [place]  After hours/weekend pager # available in [facility]

## 2019-07-29 NOTE — Progress Notes (Signed)
CRITICAL CARE PROGRESS NOTE    Name: TYBERIUS RYNER Sr. MRN: [id number] DOB: 1970-08-25     LOS: 2   SUBJECTIVE FINDINGS & SIGNIFICANT EVENTS   Patient description:   KANNAN PROIA Sr. is a 49 y.o. male with medical history significant of hepatitis C, tobacco abuse, alcohol abuse,cirrhosis, seizures, GERD, hyperlipidemia, hepatic encephalopathy requiring intubation in the past, who presents with altered mental status.Per EDP, pt's mother reported that patient was at work today, but was altered, acting abnormally and appeared quite confused.  On arrival pt was encephalopathic with active epistaxis and hematemesis  Pt with elevated venous ammonia and etoh levels.    Lines / Drains: PIV x2  Cultures / Sepsis markers: Blood cultures  Antibiotics: Rocephin  Protocols / Consultants: GI consultation  Tests / Events: KUB, CXR  07/29/2019-patient is having bloody aspirate from endotracheal tube, will hold off on extubation today. Marland Kitchen   PAST MEDICAL HISTORY   Past Medical History:  Diagnosis Date  . Chronic alcohol abuse   . Elevated transaminase level   . Hepatitis C   . Seizure ([place])   . Tobacco use      SURGICAL HISTORY   Past Surgical History:  Procedure Laterality Date  . CARPAL TUNNEL RELEASE       FAMILY HISTORY   No family history on file.   SOCIAL HISTORY   Social History   Tobacco Use  . Smoking status: Current Every Day Smoker    Packs/day: 6.00    Types: Cigars  . Smokeless tobacco: Never Used  Substance Use Topics  . Alcohol use: Yes    Alcohol/week: 6.0 standard drinks    Types: 6 Cans of beer per week    Comment: 18  . Drug use: Yes    Types: Marijuana     MEDICATIONS   Current Medication:  Current Facility-Administered Medications:  .  cefTRIAXone (ROCEPHIN)  2 g in sodium chloride 0.9 % 100 mL IVPB, 2 g, Intravenous, Q24H, Bradly Bienenstock, NP, Stopped at 07/28/19 1932 .  chlorhexidine gluconate (MEDLINE KIT) (PERIDEX) 0.12 % solution 15 mL, 15 mL, Mouth Rinse, BID, Lanney Gins, , MD, 15 mL at 07/29/19 0740 .  Chlorhexidine Gluconate Cloth 2 % PADS 6 each, 6 each, Topical, Q0600, Ottie Glazier, MD, 6 each at 07/28/19 1424 .  dexmedetomidine (PRECEDEX) 400 MCG/100ML (4 mcg/mL) infusion, 0.4-1.2 mcg/kg/hr, Intravenous, Titrated, , , MD, Last Rate: 20.3 mL/hr at 07/29/19 1100, 1.2 mcg/kg/hr at 07/29/19 1100 .  dextrose 10 % infusion, , Intravenous, Continuous, , , MD, Last Rate: 50 mL/hr at 07/28/19 1927, New Bag at 07/28/19 1927 .  fenofibrate tablet 54 mg, 54 mg, Per Tube, Daily, Darel Hong D, NP, 54 mg at 07/29/19 1124 .  fentaNYL (SUBLIMAZE) bolus via infusion 50 mcg, 50 mcg, Intravenous, Q15 min PRN, Lanney Gins, , MD .  fentaNYL (SUBLIMAZE) injection 50 mcg, 50 mcg, Intravenous, Once, , , MD .  fentaNYL 2571mg in NS 2510m(1063mml) infusion-PREMIX, 0-400 mcg/hr, Intravenous, Continuous, , , MD, Last Rate: 40 mL/hr at 07/29/19 1100, 400 mcg/hr at 07/29/19 1100 .  folic acid injection 1 mg, 1 mg, Intravenous, Daily, AleOttie GlazierD, 1 mg at 07/29/19 0917 .  lactated ringers infusion, , Intravenous, Continuous, KeeDarel Hong NP, Last Rate: 50 mL/hr at 07/29/19 1100, Rate Verify at 07/29/19 1100 .  lactulose (CHRONULAC) 10 GM/15ML solution 30 g, 30 g, Per Tube, TID, AleLanney Ginsuad, MD, 30 g at 07/29/19 0909 .  LORazepam (ATIVAN) injection 0-4  mg, 0-4 mg, Intravenous, Q6H, 2 mg at 07/29/19 0933 **FOLLOWED BY** LORazepam (ATIVAN) injection 0-4 mg, 0-4 mg, Intravenous, Q12H, Ivor Costa, MD .  LORazepam (ATIVAN) injection 1 mg, 1 mg, Intravenous, Q2H PRN, Ivor Costa, MD .  LORazepam (ATIVAN) tablet 1-4 mg, 1-4 mg, Oral, Q1H PRN **OR** LORazepam (ATIVAN) injection 1-4 mg, 1-4 mg,  Intravenous, Q1H PRN, Ivor Costa, MD .  MEDLINE mouth rinse, 15 mL, Mouth Rinse, 10 times per day, Ottie Glazier, MD, 15 mL at 07/29/19 1133 .  midazolam (VERSED) 50 mg/50 mL (1 mg/mL) premix infusion, 0.5-5 mg/hr, Intravenous, Titrated, Bradly Bienenstock, NP, Stopped at 07/29/19 [phone]  multivitamin liquid 15 mL, 15 mL, Per Tube, Daily, Ottie Glazier, MD, 15 mL at 07/29/19 0912 .  octreotide (SANDOSTATIN) 500 mcg in sodium chloride 0.9 % 250 mL (2 mcg/mL) infusion, 50 mcg/hr, Intravenous, Continuous, Annya Lizana, MD, Last Rate: 25 mL/hr at 07/29/19 1100, 50 mcg/hr at 07/29/19 1100 .  pantoprazole (PROTONIX) 80 mg in sodium chloride 0.9 % 250 mL (0.32 mg/mL) infusion, 8 mg/hr, Intravenous, Continuous, Darel Hong D, NP, Last Rate: 25 mL/hr at 07/29/19 1100, 8 mg/hr at 07/29/19 1100 .  [START ON 07/31/2019] pantoprazole (PROTONIX) injection 40 mg, 40 mg, Intravenous, Q12H, Darel Hong D, NP .  phenylephrine (NEO-SYNEPHRINE) 10 mg in sodium chloride 0.9 % 250 mL (0.04 mg/mL) infusion, 0-400 mcg/min, Intravenous, Titrated, Darel Hong D, NP, Last Rate: 30 mL/hr at 07/29/19 1100, 20 mcg/min at 07/29/19 1100 .  sodium chloride flush (NS) 0.9 % injection 10-40 mL, 10-40 mL, Intracatheter, Q12H, Ottie Glazier, MD, 10 mL at 07/28/19 2102 .  sodium chloride flush (NS) 0.9 % injection 10-40 mL, 10-40 mL, Intracatheter, PRN, Lanney Gins, Jeorgia Helming, MD .  thiamine 573m in normal saline (57m IVPB, 500 mg, Intravenous, Daily, SiCharlett NoseRPH, Last Rate: 100 mL/hr at 07/29/19 0919, 500 mg at 07/29/19 0919 .  [START ON 08/01/2019] thiamine tablet 100 mg, 100 mg, Per Tube, Daily, SiCharlett NoseRPSurgcenter Northeast LLC  ALLERGIES   Patient has no known allergies.    REVIEW OF SYSTEMS     Unable to obtain as patient is poorly responsive with severely altered sensorium  PHYSICAL EXAMINATION   Vital Signs: Temp:  [97 F (36.1 C)-100.9 F (38.3 C)] 98.6 F (37 C) (02/28 0900) Pulse Rate:   [61-79] 73 (02/28 1100) Resp:  [14-17] 16 (02/28 1100) BP: (86-116)/(58-77) 113/71 (02/28 1100) SpO2:  [85 %-100 %] 98 % (02/28 1121) FiO2 (%):  [35 %-50 %] 45 % (02/28 1121)  GENERAL: acutely ill appearing HEAD: Normocephalic, atraumatic.  EYES: Pupils equal, round, reactive to light.  No scleral icterus.  MOUTH: Moist mucosal membrane. NECK: Supple. No thyromegaly. No nodules. No JVD.  PULMONARY: Decreased breath sounds bilaterally CARDIOVASCULAR: S1 and S2. Regular rate and rhythm. No murmurs, rubs, or gallops.  GASTROINTESTINAL: Soft, nontender, non-distended. No masses. Positive bowel sounds. No hepatosplenomegaly.  MUSCULOSKELETAL: No swelling, clubbing, or edema.  NEUROLOGIC: Rass -2  SKIN:intact,warm,dry   PERTINENT DATA     Infusions: . cefTRIAXone (ROCEPHIN)  IV Stopped (07/28/19 1932)  . dexmedetomidine (PRECEDEX) IV infusion 1.2 mcg/kg/hr (07/29/19 1100)  . dextrose 50 mL/hr at 07/28/19 1927  . fentaNYL infusion INTRAVENOUS 400 mcg/hr (07/29/19 1100)  . lactated ringers 50 mL/hr at 07/29/19 1100  . midazolam Stopped (07/29/19 0742)  . octreotide  (SANDOSTATIN)    IV infusion 50 mcg/hr (07/29/19 1100)  . pantoprozole (PROTONIX) infusion 8 mg/hr (07/29/19 1100)  . phenylephrine (NEO-SYNEPHRINE) Adult infusion  20 mcg/min (07/29/19 1100)  . thiamine injection 500 mg (07/29/19 0919)   Scheduled Medications: . chlorhexidine gluconate (MEDLINE KIT)  15 mL Mouth Rinse BID  . Chlorhexidine Gluconate Cloth  6 each Topical Q0600  . fenofibrate  54 mg Per Tube Daily  . fentaNYL (SUBLIMAZE) injection  50 mcg Intravenous Once  . folic acid  1 mg Intravenous Daily  . lactulose  30 g Per Tube TID  . LORazepam  0-4 mg Intravenous Q6H   Followed by  . LORazepam  0-4 mg Intravenous Q12H  . mouth rinse  15 mL Mouth Rinse 10 times per day  . multivitamin  15 mL Per Tube Daily  . [START ON 07/31/2019] pantoprazole  40 mg Intravenous Q12H  . sodium chloride flush  10-40 mL  Intracatheter Q12H  . [START ON 08/01/2019] thiamine  100 mg Per Tube Daily   PRN Medications: fentaNYL, LORazepam, LORazepam **OR** LORazepam, sodium chloride flush Hemodynamic parameters:   Intake/Output: 02/27 0701 - 02/28 0700 In: 8444.9 [I.V.:6415.4; IV Piggyback:2029.5] Out: 2705 [Urine:2155; Emesis/NG output:550]  Ventilator  Settings: Vent Mode: PRVC FiO2 (%):  [35 %-50 %] 45 % Set Rate:  [0 bmp-16 bmp] 16 bmp Vt Set:  [550 mL] 550 mL PEEP:  [5 cmH20] 5 cmH20 Pressure Support:  [5 cmH20] 5 cmH20 Plateau Pressure:  [11 RJJ88-41 cmH20] 18 cmH20    LAB RESULTS:  Basic Metabolic Panel: Recent Labs  Lab 07/27/19 1300 07/27/19 1300 07/27/19 1929 07/27/19 1929 07/28/19 0503 07/29/19 0501  NA 138  --  140  --  143 137  K 4.0   < > 4.3   < > 3.0* 3.9  CL 106  --  113*  --  117* 112*  CO2 20*  --  19*  --  19* 16*  GLUCOSE 119*  --  118*  --  119* 116*  BUN 14  --  15  --  14 10  CREATININE 0.81  --  0.81  --  0.81 0.93  CALCIUM 9.2  --  7.8*  --  7.5* 7.7*  MG 2.1  --   --   --  1.7 1.9  PHOS 2.4*  --   --   --  4.2 3.0   < > = values in this interval not displayed.   Liver Function Tests: Recent Labs  Lab 07/27/19 1300 07/27/19 1929 07/28/19 0503 07/29/19 0501  AST 70* 54* 48* 36  ALT 54* 46* 37 30  ALKPHOS 75 66 47 65  BILITOT 0.9 0.7 0.8 0.7  PROT 8.0 6.5 6.0* 5.8*  ALBUMIN 4.3 3.6 3.3* 3.0*   Recent Labs  Lab 07/27/19 1937  LIPASE 23  AMYLASE 80   Recent Labs  Lab 07/27/19 1300 07/28/19 0503 07/29/19 0501  AMMONIA 302* 55* 54*   CBC: Recent Labs  Lab 07/27/19 1300 07/27/19 1300 07/27/19 1929 07/27/19 2359 07/28/19 0503 07/28/19 1230 07/28/19 1727 07/29/19 0006 07/29/19 0501  WBC 7.8  --  8.9  --  11.7*  --   --   --  9.2  NEUTROABS 4.3  --   --   --   --   --   --   --   --   HGB 13.3   < > 11.0*   < > 10.0* 10.3* 10.0* 9.9* 10.0*  HCT 37.3*   < > 31.7*   < > 29.5* 31.1* 30.5* 30.2* 30.8*  MCV 91.0  --  93.2  --  94.9  --   --    --  97.2  PLT 262  --  211  --  178  --   --   --  150   < > = values in this interval not displayed.   Cardiac Enzymes: No results for input(s): CKTOTAL, CKMB, CKMBINDEX, TROPONINI in the last 168 hours. BNP: Invalid input(s): POCBNP CBG: Recent Labs  Lab 07/29/19 0310 07/29/19 0516 07/29/19 0738 07/29/19 1023 07/29/19 1132  GLUCAP 125* 120* 113* 116* 130*     IMAGING RESULTS:  Imaging: CT ABDOMEN PELVIS WO CONTRAST  Result Date: 07/28/2019 CLINICAL DATA:  Nausea and vomiting. Hepatitis C. Tobacco abuse. Alcohol abuse. Cirrhosis. Hepatic encephalopathy. Mental status changes. EXAM: CT ABDOMEN AND PELVIS WITHOUT CONTRAST TECHNIQUE: Multidetector CT imaging of the abdomen and pelvis was performed following the standard protocol without IV contrast. COMPARISON:  Plain films 07/27/2019. Ultrasound 02/12/2019. No prior CT. FINDINGS: Lower chest: Bibasilar atelectasis.  Normal heart size. Hepatobiliary: Liver not well evaluated secondary to lack of IV contrast and patient arm position. Hepatomegaly at 22 cm. There also extensive EKG and wire lead artifacts. Mild cirrhosis. Nonspecific gallbladder wall thickening is suspected. No calcified stone or pericholecystic edema. No biliary duct dilatation. Pancreas: Normal, without mass or ductal dilatation. Spleen: Normal in size, without focal abnormality. Adrenals/Urinary Tract: Normal adrenal glands. No renal calculi or hydronephrosis. Foley catheter within the urinary bladder. Stomach/Bowel: Tiny hiatal hernia. Nasogastric tube terminating at the gastric body. Rectal catheter. Normal colon, appendix, and terminal ileum. Normal small bowel. Vascular/Lymphatic: Aortic atherosclerosis. No abdominopelvic adenopathy. Reproductive: Normal prostate. Other: Trace pelvic fluid.  No free intraperitoneal air. Musculoskeletal: No acute osseous abnormality. Degenerative disc disease at L4-5 and L5-S1. IMPRESSION: 1. Multifactorial degradation, primarily involving  the upper abdomen, as detailed above. 2.  No acute process in the abdomen or pelvis. 3. Cirrhosis and hepatomegaly. 4. Trace fluid within the pelvis. 5.  Aortic Atherosclerosis (ICD10-I70.0). Electronically Signed   By: Abigail Miyamoto M.D.   On: 07/28/2019 13:48   CT HEAD WO CONTRAST  Result Date: 07/28/2019 CLINICAL DATA:  Encephalopathy. EXAM: CT HEAD WITHOUT CONTRAST TECHNIQUE: Contiguous axial images were obtained from the base of the skull through the vertex without intravenous contrast. COMPARISON:  May 30, 2019 FINDINGS: Brain: No evidence of acute infarction, hemorrhage, hydrocephalus, extra-axial collection or mass lesion/mass effect. Vascular: No hyperdense vessel or unexpected calcification. Skull: Normal. Negative for fracture or focal lesion. Sinuses/Orbits: Mild sphenoid and scattered ethmoid sinus disease. Visualized portions the orbits are unremarkable. Other: None. IMPRESSION: 1. No evidence of acute intracranial pathology. 2. Mild sphenoid and scattered ethmoid sinus disease. Electronically Signed   By: Zetta Bills M.D.   On: 07/28/2019 13:41   DG Chest Port 1 View  Result Date: 07/29/2019 CLINICAL DATA:  Intubated EXAM: PORTABLE CHEST 1 VIEW COMPARISON:  07/28/2019 FINDINGS: Endotracheal tube with the tip 3.4 cm above the carina. Nasogastric tube with the tip projecting over the stomach. Bilateral patchy interstitial and alveolar airspace opacities. No pleural effusion or pneumothorax. Stable cardiomediastinal silhouette. No acute osseous abnormality. IMPRESSION: 1. Endotracheal tube with the tip 3.4 cm above the carina. 2. Bilateral interstitial and patchy alveolar airspace opacities which may reflect mild pulmonary edema versus multilobar pneumonia. Electronically Signed   By: Kathreen Devoid   On: 07/29/2019 10:48   DG Chest Port 1 View  Result Date: 07/28/2019 CLINICAL DATA:  Endotracheal tube EXAM: PORTABLE CHEST 1 VIEW COMPARISON:  07/28/2019 FINDINGS: The endotracheal tube  terminates approximately 4.2 cm above the carina. The enteric tube extends below the left hemidiaphragm.  There are perihilar airspace opacities with prominent interstitial lung markings. No pneumothorax. No convincing pleural effusion. The heart size is stable from prior study. IMPRESSION: Satisfactory positioning of the endotracheal and enteric tubes. Developing perihilar airspace opacities which may represent pulmonary edema in the appropriate clinical setting. Electronically Signed   By: Constance Holster M.D.   On: 07/28/2019 23:34   DG Chest Port 1 View  Result Date: 07/28/2019 CLINICAL DATA:  Acute respiratory failure, intubated EXAM: PORTABLE CHEST 1 VIEW COMPARISON:  07/27/2019 FINDINGS: Single frontal view of the chest demonstrates stable position of the endotracheal tube and enteric catheters. The cardiac silhouette is unchanged. Stable diffuse interstitial prominence without airspace disease, effusion, or pneumothorax. There is mild central vascular congestion. IMPRESSION: 1. Central vascular congestion without superimposed airspace disease. 2. Stable support devices. Electronically Signed   By: Randa Ngo M.D.   On: 07/28/2019 03:20   DG Chest Port 1 View  Result Date: 07/27/2019 CLINICAL DATA:  Intubated, hepatic encephalopathy EXAM: PORTABLE CHEST 1 VIEW COMPARISON:  05/31/2019 FINDINGS: Single frontal view of the chest demonstrates endotracheal tube overlying tracheal air column tip just below thoracic inlet. Enteric catheter tip and side port project over gastric body. Cardiac silhouette is unremarkable. Diffuse interstitial prominence throughout the lungs consistent with history of tobacco abuse, stable. No airspace disease, effusion, or pneumothorax. IMPRESSION: 1. Chronic interstitial lung disease, no acute process. 2. No complication after intubation. Electronically Signed   By: Randa Ngo M.D.   On: 07/27/2019 19:44   DG Abd Portable 1V  Result Date: 07/27/2019 CLINICAL  DATA:  Enteric catheter placement, hepatic encephalopathy EXAM: PORTABLE ABDOMEN - 1 VIEW COMPARISON:  None. FINDINGS: Supine frontal view of the lower chest and upper abdomen demonstrates enteric catheter tip and side port projecting over gastric body. Bowel gas pattern is unremarkable. No bowel obstruction or ileus. No masses or abnormal calcifications. Lung bases are clear. IMPRESSION: 1. Enteric catheter overlying gastric body. Electronically Signed   By: Randa Ngo M.D.   On: 07/27/2019 19:43   DG Hand Complete Right  Result Date: 07/27/2019 CLINICAL DATA:  Right hand injury EXAM: RIGHT HAND - COMPLETE 3+ VIEW COMPARISON:  05/30/2019 FINDINGS: Frontal, oblique, and lateral views of the right hand are obtained. Prior healed distal right radial fracture with resulting dorsal angulation of the radiocarpal joint unchanged. No acute displaced fractures. Likely previous healed fracture distal aspect right fifth metacarpal. There is radiocarpal joint space narrowing. Remaining joint spaces are relatively well preserved. Soft tissues appear normal. IMPRESSION: 1. Sequela of chronic healed distal right radial fracture, with stable dorsal angulation and radiocarpal osteoarthritis. 2. No acute bony abnormality of the right hand. Electronically Signed   By: Randa Ngo M.D.   On: 07/27/2019 19:46      ASSESSMENT AND PLAN    -Multidisciplinary rounds held today  Acute Hypoxic Respiratory Failure -Due to multiple episodes of aspiration of bloody vomitus/epistaxis -Status post emergent endotracheal intubation -continue Bronchodilator Therapy -Wean Fio2 and PEEP as tolerated -will perform SAT/SBT when respiratory parameters are met   Altered mental status with lethargy   -Likely due to hepatic encephalopathy    -venous mmonia profoundly elevated, status post lactulose with improvement   Acute upper GI bleed -Protonix 40 mg twice daily -GI consultation -signing off for now -medical management  only -Monitor H&H -Lactulose due to elevated venous ammonia levels with likely hepatic encephalopathy ICU telemetry monitoring  Transaminitis  -History of alcoholism/substance abuse   Moderate protein calorie malnutrition   -Hypophosphatemia-monitor  for refeeding syndrome   History of alcoholism/substance abuse -Banana bag with thiamine and folate repletion -Patient is currently on propofol and fentanyl however still need to monitor for signs of withdrawal -Street of seizure disorder  NEUROLOGY - intubated and sedated - minimal sedation to achieve a RASS goal: -1 Wake up assessment pending    ID -continue IV abx as prescibed -follow up cultures  GI/Nutrition GI PROPHYLAXIS as indicated DIET-->TF's as tolerated Constipation protocol as indicated  ENDO - ICU hypoglycemic\Hyperglycemia protocol -check FSBS per protocol   ELECTROLYTES -follow labs as needed -replace as needed -pharmacy consultation   DVT/GI PRX ordered -SCDs  TRANSFUSIONS AS NEEDED MONITOR FSBS ASSESS the need for LABS as needed   Critical care provider statement:    Critical care time (minutes):  33   Critical care time was exclusive of:  Separately billable procedures and treating other patients   Critical care was necessary to treat or prevent imminent or life-threatening deterioration of the following conditions:   Altered mental status with lethargy, acute hypoxemic respiratory failure, upper GI bleed, hematemesis, substance abuse, alcoholism, transaminitis, hypophosphatemia, multiple comorbid conditions   Critical care was time spent personally by me on the following activities:  Development of treatment plan with patient or surrogate, discussions with consultants, evaluation of patient's response to treatment, examination of patient, obtaining history from patient or surrogate, ordering and performing treatments and interventions, ordering and review of laboratory studies and re-evaluation  of patient's condition.  I assumed direction of critical care for this patient from another provider in my specialty: no    This document was prepared using Dragon voice recognition software and may include unintentional dictation errors.    Ottie Glazier, M.D.  Division of [place]

## 2019-07-29 NOTE — Plan of Care (Signed)

## 2019-07-29 NOTE — Progress Notes (Addendum)
Shift summary:  - Sedation vacation this AM; tolerating well so far.  - SBT: patient develops bradycardia and subsequently hypoxia. - SBT terminated with a plan to retry later in the morning once sedation has cleared (patient has a history of liver impairment).  - Foley catheter removed this AM (no indication for use). Condom cath placed.   - Patient failed second attempt at SBT: patient began violently thrashing in the bed leading to increasing bloody secretions from ETT, unable to reorient and redirect. Patient was re-sedated per Dr. Karna Christmas.

## 2019-07-29 NOTE — Progress Notes (Signed)
Pharmacy Electrolyte Monitoring Consult:  Pharmacy consulted to assist in monitoring and replacing electrolytes in this 49 y.o. male admitted on 07/27/2019 with Altered Mental Status. Patient is currently intubated and sedated on Fentanyl and Propofol infusions. Patient with past medical history significant for Hepatitis C, Alcohol abuse, hepatic encephalopathy.   Patient with epistaxis secondary to COVID swab. Patient being evaluated for GI Bleed.   Labs:  Sodium (mmol/L)  Date Value  07/29/2019 137  10/13/2012 133 (L)   Potassium (mmol/L)  Date Value  07/29/2019 3.9  10/13/2012 5.0   Magnesium (mg/dL)  Date Value  42/90/3795 1.9   Phosphorus (mg/dL)  Date Value  58/31/6742 3.0   Calcium (mg/dL)  Date Value  55/25/8948 7.7 (L)   Calcium, Total (mg/dL)  Date Value  34/75/8307 8.2 (L)   Albumin (g/dL)  Date Value  46/00/2984 3.0 (L)  10/13/2012 4.0    Assessment/Plan: 1. Electrolytes: no electrolyte replacement at this time. Will replace to maintain Potassium ~ 4, Magnesium ~2, and phosphorus 2.5-3. Electrolytes with morning labs.  2. Glucose: no history of diabetes noted. Will monitor via BMPs and initiate SSI as appropriate.  3. Constipation: patient being evaluated for possible GI bleed and is NPO. Will continue to monitor.   4. Tube medications: patient currently NPO. Will continue to monitor.   Pharmacy will continue to monitor and adjust per consult.    Pricilla Riffle, PharmD 07/29/2019 7:45 AM

## 2019-07-30 ENCOUNTER — Inpatient Hospital Stay: Payer: No Typology Code available for payment source

## 2019-07-30 DIAGNOSIS — J9601 Acute respiratory failure with hypoxia: Secondary | ICD-10-CM

## 2019-07-30 DIAGNOSIS — K7200 Acute and subacute hepatic failure without coma: Principal | ICD-10-CM

## 2019-07-30 DIAGNOSIS — F1010 Alcohol abuse, uncomplicated: Secondary | ICD-10-CM

## 2019-07-30 LAB — CBC
HCT: 31.2 % — ABNORMAL LOW (ref 39.0–52.0)
Hemoglobin: 10.2 g/dL — ABNORMAL LOW (ref 13.0–17.0)
MCH: 32.2 pg (ref 26.0–34.0)
MCHC: 32.7 g/dL (ref 30.0–36.0)
MCV: 98.4 fL (ref 80.0–100.0)
Platelets: 154 10*3/uL (ref 150–400)
RBC: 3.17 MIL/uL — ABNORMAL LOW (ref 4.22–5.81)
RDW: 14.1 % (ref 11.5–15.5)
WBC: 11.2 10*3/uL — ABNORMAL HIGH (ref 4.0–10.5)
nRBC: 0 % (ref 0.0–0.2)

## 2019-07-30 LAB — COMPREHENSIVE METABOLIC PANEL
ALT: 24 U/L (ref 0–44)
AST: 29 U/L (ref 15–41)
Albumin: 2.7 g/dL — ABNORMAL LOW (ref 3.5–5.0)
Alkaline Phosphatase: 67 U/L (ref 38–126)
Anion gap: 6 (ref 5–15)
BUN: 10 mg/dL (ref 6–20)
CO2: 22 mmol/L (ref 22–32)
Calcium: 7.7 mg/dL — ABNORMAL LOW (ref 8.9–10.3)
Chloride: 112 mmol/L — ABNORMAL HIGH (ref 98–111)
Creatinine, Ser: 0.81 mg/dL (ref 0.61–1.24)
GFR calc Af Amer: >60 mL/min (ref >60)
GFR calc non Af Amer: >60 mL/min (ref >60)
Glucose, Bld: 90 mg/dL (ref 70–99)
Potassium: 3.9 mmol/L (ref 3.5–5.1)
Sodium: 140 mmol/L (ref 135–145)
Total Bilirubin: 0.6 mg/dL (ref 0.3–1.2)
Total Protein: 5.8 g/dL — ABNORMAL LOW (ref 6.5–8.1)

## 2019-07-30 LAB — GLUCOSE, CAPILLARY
Glucose-Capillary: 107 mg/dL — ABNORMAL HIGH (ref 70–99)
Glucose-Capillary: 110 mg/dL — ABNORMAL HIGH (ref 70–99)
Glucose-Capillary: 69 mg/dL — ABNORMAL LOW (ref 70–99)
Glucose-Capillary: 75 mg/dL (ref 70–99)
Glucose-Capillary: 78 mg/dL (ref 70–99)
Glucose-Capillary: 79 mg/dL (ref 70–99)
Glucose-Capillary: 85 mg/dL (ref 70–99)

## 2019-07-30 LAB — HEMOGLOBIN AND HEMATOCRIT, BLOOD
HCT: 29.9 % — ABNORMAL LOW (ref 39.0–52.0)
HCT: 27.7 % — ABNORMAL LOW (ref 39.0–52.0)
Hemoglobin: 10 g/dL — ABNORMAL LOW (ref 13.0–17.0)
Hemoglobin: 9.2 g/dL — ABNORMAL LOW (ref 13.0–17.0)

## 2019-07-30 LAB — PHOSPHORUS: Phosphorus: 3 mg/dL (ref 2.5–4.6)

## 2019-07-30 LAB — MAGNESIUM: Magnesium: 2 mg/dL (ref 1.7–2.4)

## 2019-07-30 LAB — TRIGLYCERIDES: Triglycerides: 213 mg/dL — ABNORMAL HIGH (ref <150)

## 2019-07-30 LAB — AMMONIA: Ammonia: 48 umol/L — ABNORMAL HIGH (ref 9–35)

## 2019-07-30 MED ORDER — OSMOLITE 1.5 CAL PO LIQD: 1000.0000 mL | ORAL | Status: DC

## 2019-07-30 MED ORDER — SODIUM CHLORIDE 0.9 % IV SOLN
3.0000 g | Freq: Four times a day (QID) | INTRAVENOUS | Status: DC
  Administered 2019-07-30 – 2019-08-05 (×21): 3 g via INTRAVENOUS
  Filled 2019-07-30 (×6): qty 3
  Filled 2019-07-30: qty 8
  Filled 2019-07-30: qty 3
  Filled 2019-07-30: qty 8
  Filled 2019-07-30 (×4): qty 3
  Filled 2019-07-30 (×3): qty 8
  Filled 2019-07-30 (×6): qty 3
  Filled 2019-07-30 (×3): qty 8
  Filled 2019-07-30: qty 3

## 2019-07-30 MED ORDER — ORAL CARE MOUTH RINSE
15.0000 mL | Freq: Two times a day (BID) | OROMUCOSAL | Status: DC
  Administered 2019-07-31 – 2019-08-04 (×10): 15 mL via OROMUCOSAL

## 2019-07-30 MED ORDER — PHENOBARBITAL 32.4 MG PO TABS: 64.8000 mg | ORAL_TABLET | Freq: Three times a day (TID) | ORAL | Status: DC

## 2019-07-30 MED ORDER — PANTOPRAZOLE SODIUM 40 MG IV SOLR
40.0000 mg | Freq: Two times a day (BID) | INTRAVENOUS | Status: DC
  Administered 2019-07-30 – 2019-08-02 (×8): 40 mg via INTRAVENOUS
  Filled 2019-07-30 (×9): qty 40

## 2019-07-30 MED ORDER — DEXTROSE IN LACTATED RINGERS 5 % IV SOLN
INTRAVENOUS | Status: DC
  Administered 2019-07-30: 75 mL/h via INTRAVENOUS

## 2019-07-30 MED ORDER — SODIUM CHLORIDE 0.9 % IV SOLN
3.0000 g | Freq: Two times a day (BID) | INTRAVENOUS | Status: DC
  Administered 2019-07-30: 3 g via INTRAVENOUS
  Filled 2019-07-30: qty 3
  Filled 2019-07-30 (×2): qty 8

## 2019-07-30 MED ORDER — LORAZEPAM 1 MG PO TABS: 1.0000 mg | ORAL_TABLET | ORAL | Status: DC | PRN

## 2019-07-30 MED ORDER — PHENOBARBITAL 32.4 MG PO TABS: 32.4000 mg | ORAL_TABLET | Freq: Three times a day (TID) | ORAL | Status: DC

## 2019-07-30 MED ORDER — MAGNESIUM SULFATE 2 GM/50ML IV SOLN
2.0000 g | Freq: Once | INTRAVENOUS | Status: AC
  Administered 2019-07-30: 2 g via INTRAVENOUS
  Filled 2019-07-30: qty 50

## 2019-07-30 MED ORDER — PHENOBARBITAL 32.4 MG PO TABS: 97.2000 mg | ORAL_TABLET | Freq: Three times a day (TID) | ORAL | Status: DC

## 2019-07-30 MED ORDER — PRO-STAT SUGAR FREE PO LIQD
30.0000 mL | Freq: Every day | ORAL | Status: DC
  Administered 2019-07-30: 30 mL

## 2019-07-30 MED ORDER — LORAZEPAM 2 MG/ML IJ SOLN
1.0000 mg | INTRAMUSCULAR | Status: DC | PRN
  Administered 2019-07-30 – 2019-07-31 (×3): 2 mg via INTRAVENOUS
  Administered 2019-07-31: 4 mg via INTRAVENOUS
  Administered 2019-07-31 (×2): 2 mg via INTRAVENOUS
  Filled 2019-07-30 (×5): qty 1
  Filled 2019-07-30: qty 2
  Filled 2019-07-30: qty 1

## 2019-07-30 MED ORDER — VITAL HIGH PROTEIN PO LIQD: 1000.0000 mL | ORAL | Status: DC

## 2019-07-30 NOTE — Progress Notes (Signed)
Pharmacy Electrolyte Monitoring Consult:  Pharmacy consulted to assist in monitoring and replacing electrolytes in this 49 y.o. male admitted on 07/27/2019 with Altered Mental Status. Patient is currently intubated and sedated on Fentanyl and Propofol infusions. Patient with past medical history significant for Hepatitis C, Alcohol abuse, hepatic encephalopathy.   Patient with epistaxis secondary to COVID swab. Patient being evaluated for GI Bleed.   Labs:  Sodium (mmol/L)  Date Value  07/30/2019 140  10/13/2012 133 (L)   Potassium (mmol/L)  Date Value  07/30/2019 3.9  10/13/2012 5.0   Magnesium (mg/dL)  Date Value  57/32/2025 2.0   Phosphorus (mg/dL)  Date Value  42/70/6237 3.0   Calcium (mg/dL)  Date Value  62/83/1517 7.7 (L)   Calcium, Total (mg/dL)  Date Value  61/60/7371 8.2 (L)   Albumin (g/dL)  Date Value  11/24/9483 2.7 (L)  10/13/2012 4.0   Corrected Ca: 8.74 mg/dL  Assessment/Plan: 1. Electrolytes: no electrolyte replacement at this time.  Will replace to maintain Potassium ~ 4, Magnesium ~2, and other electrolytes WNL Electrolytes with morning labs.  2. Glucose: no history of diabetes noted. Will monitor via BMPs and initiate SSI as appropriate.  3. Constipation: patient being evaluated for possible GI bleed and is NPO. Will continue to monitor.   4. Tube medications: patient currently NPO. Will continue to monitor.   Pharmacy will continue to monitor and adjust per consult.    Joel Tanner, PharmD 07/30/2019 7:16 AM

## 2019-07-30 NOTE — Clinical Social Work Note (Signed)
CSW acknowledges consult for substance abuse resources. Patient currently on ventilator. Will continue to follow. Patient has been offered/given resources for treatment the past few admissions.  Charlynn Court, CSW [phone]

## 2019-07-30 NOTE — Progress Notes (Signed)
Follow up - Critical Care Medicine Note  Patient Details:    Joel SAVASTANO Sr. is an 49 y.o. malewith medical history significant ofhepatitis C, tobacco abuse, alcohol abuse,cirrhosis, seizures, GERD, hyperlipidemia, hepatic encephalopathy requiring intubation in the past, who presents with altered mental status. Per EDP, pt's mother reported thatpatient was at work today,but was altered, acting abnormally and appeared quite confused.  On arrival pt was encephalopathic with active epistaxis and hematemesis  Pt with elevated venous ammonia and etoh levels.  Patient had aspiration event.  Required intubation and mechanical ventilation  Lines, Airways, Drains: Urethral Catheter merline G Straight-tip 16 Fr. (Active)  Indication for Insertion or Continuance of Catheter Acute urinary retention (I&O Cath for 24 hrs prior to catheter insertion- Inpatient Only) 07/30/19 1928  Site Assessment Clean;Intact 07/30/19 1928  Catheter Maintenance Bag below level of bladder;Drainage bag/tubing not touching floor;No dependent loops 07/30/19 1928  Collection Container Standard drainage bag 07/30/19 1928  Securement Method Securing device (Describe) 07/30/19 1928  Urinary Catheter Interventions (if applicable) Unclamped 07/30/19 1928  Output (mL) 125 mL 07/30/19 1929    Anti-infectives:  Anti-infectives (From admission, onward)   Start     Dose/Rate Route Frequency Ordered Stop   07/30/19 2045  Ampicillin-Sulbactam (UNASYN) 3 g in sodium chloride 0.9 % 100 mL IVPB     3 g 200 mL/hr over 30 Minutes Intravenous Every 6 hours 07/30/19 1515     07/30/19 1200  Ampicillin-Sulbactam (UNASYN) 3 g in sodium chloride 0.9 % 100 mL IVPB  Status:  Discontinued     3 g 200 mL/hr over 30 Minutes Intravenous Every 12 hours 07/30/19 1049 07/30/19 1515   07/27/19 1945  cefTRIAXone (ROCEPHIN) 2 g in sodium chloride 0.9 % 100 mL IVPB  Status:  Discontinued     2 g 200 mL/hr over 30 Minutes Intravenous Every 24 hours  07/27/19 1931 07/30/19 1049   07/27/19 1930  cefTRIAXone (ROCEPHIN) 1 g in sodium chloride 0.9 % 100 mL IVPB  Status:  Discontinued     1 g 200 mL/hr over 30 Minutes Intravenous Every 24 hours 07/27/19 1903 07/27/19 1931      Microbiology: Results for orders placed or performed during the hospital encounter of 07/27/19  Respiratory Panel by RT PCR (Flu A&B, Covid) - Nasopharyngeal Swab     Status: None   Collection Time: 07/27/19  3:34 PM   Specimen: Nasopharyngeal Swab  Result Value Ref Range Status   SARS Coronavirus 2 by RT PCR NEGATIVE NEGATIVE Final    Comment: (NOTE) SARS-CoV-2 target nucleic acids are NOT DETECTED. The SARS-CoV-2 RNA is generally detectable in upper respiratoy specimens during the acute phase of infection. The lowest concentration of SARS-CoV-2 viral copies this assay can detect is 131 copies/mL. A negative result does not preclude SARS-Cov-2 infection and should not be used as the sole basis for treatment or other patient management decisions. A negative result may occur with  improper specimen collection/handling, submission of specimen other than nasopharyngeal swab, presence of viral mutation(s) within the areas targeted by this assay, and inadequate number of viral copies (<131 copies/mL). A negative result must be combined with clinical observations, patient history, and epidemiological information. The expected result is Negative. Fact Sheet for Patients:  https://www.moore.com/ Fact Sheet for Healthcare Providers:  https://www.young.biz/ This test is not yet ap proved or cleared by the [place] FDA and  has been authorized for detection and/or diagnosis of SARS-CoV-2 by FDA under an Emergency Use Authorization (EUA). This EUA will  remain  in effect (meaning this test can be used) for the duration of the COVID-19 declaration under Section 564(b)(1) of the Act, 21 U.S.C. section 360bbb-3(b)(1), unless the  authorization is terminated or revoked sooner.    Influenza A by PCR NEGATIVE NEGATIVE Final   Influenza B by PCR NEGATIVE NEGATIVE Final    Comment: (NOTE) The Xpert Xpress SARS-CoV-2/FLU/RSV assay is intended as an aid in  the diagnosis of influenza from Nasopharyngeal swab specimens and  should not be used as a sole basis for treatment. Nasal washings and  aspirates are unacceptable for Xpert Xpress SARS-CoV-2/FLU/RSV  testing. Fact Sheet for Patients: https://www.moore.com/ Fact Sheet for Healthcare Providers: https://www.young.biz/ This test is not yet approved or cleared by the [place] FDA and  has been authorized for detection and/or diagnosis of SARS-CoV-2 by  FDA under an Emergency Use Authorization (EUA). This EUA will remain  in effect (meaning this test can be used) for the duration of the  Covid-19 declaration under Section 564(b)(1) of the Act, 21  U.S.C. section 360bbb-3(b)(1), unless the authorization is  terminated or revoked. Performed at [facility], [address] [place], [place] [address]   MRSA PCR Screening     Status: None   Collection Time: 07/27/19  5:39 PM   Specimen: Nasopharyngeal Swab  Result Value Ref Range Status   MRSA by PCR NEGATIVE NEGATIVE Final    Comment:        The GeneXpert MRSA Assay (FDA approved for NASAL specimens only), is one component of a comprehensive MRSA colonization surveillance program. It is not intended to diagnose MRSA infection nor to guide or monitor treatment for MRSA infections. Performed at [facility], [address] [place], [place] [address]   Respiratory Panel by RT PCR (Flu A&B, Covid) - Nasopharyngeal Swab     Status: None   Collection Time: 07/27/19  5:39 PM   Specimen: Nasopharyngeal Swab  Result Value Ref Range Status   SARS Coronavirus 2 by RT PCR NEGATIVE NEGATIVE Final    Comment: (NOTE) SARS-CoV-2 target nucleic acids are NOT  DETECTED. The SARS-CoV-2 RNA is generally detectable in upper respiratoy specimens during the acute phase of infection. The lowest concentration of SARS-CoV-2 viral copies this assay can detect is 131 copies/mL. A negative result does not preclude SARS-Cov-2 infection and should not be used as the sole basis for treatment or other patient management decisions. A negative result may occur with  improper specimen collection/handling, submission of specimen other than nasopharyngeal swab, presence of viral mutation(s) within the areas targeted by this assay, and inadequate number of viral copies (<131 copies/mL). A negative result must be combined with clinical observations, patient history, and epidemiological information. The expected result is Negative. Fact Sheet for Patients:  https://www.moore.com/ Fact Sheet for Healthcare Providers:  https://www.young.biz/ This test is not yet ap proved or cleared by the [place] FDA and  has been authorized for detection and/or diagnosis of SARS-CoV-2 by FDA under an Emergency Use Authorization (EUA). This EUA will remain  in effect (meaning this test can be used) for the duration of the COVID-19 declaration under Section 564(b)(1) of the Act, 21 U.S.C. section 360bbb-3(b)(1), unless the authorization is terminated or revoked sooner.    Influenza A by PCR NEGATIVE NEGATIVE Final   Influenza B by PCR NEGATIVE NEGATIVE Final    Comment: (NOTE) The Xpert Xpress SARS-CoV-2/FLU/RSV assay is intended as an aid in  the diagnosis of influenza from Nasopharyngeal swab specimens and  should not be used as a sole basis for treatment. Nasal washings and  aspirates are unacceptable for Xpert Xpress SARS-CoV-2/FLU/RSV  testing. Fact Sheet for Patients: PinkCheek.be Fact Sheet for Healthcare Providers: GravelBags.it This test is not yet approved or cleared  by the [place] FDA and  has been authorized for detection and/or diagnosis of SARS-CoV-2 by  FDA under an Emergency Use Authorization (EUA). This EUA will remain  in effect (meaning this test can be used) for the duration of the  Covid-19 declaration under Section 564(b)(1) of the Act, 21  U.S.C. section 360bbb-3(b)(1), unless the authorization is  terminated or revoked. Performed at [facility], [address] [place], [place] [address]   CULTURE, BLOOD (ROUTINE X 2) w Reflex to ID Panel     Status: None (Preliminary result)   Collection Time: 07/27/19  7:29 PM   Specimen: BLOOD  Result Value Ref Range Status   Specimen Description BLOOD BLOOD RIGHT HAND  Final   Special Requests   Final    BOTTLES DRAWN AEROBIC AND ANAEROBIC Blood Culture adequate volume   Culture   Final    NO GROWTH 3 DAYS Performed at [facility], [address], [place], [place] [address]    Report Status PENDING  Incomplete  Culture, respiratory (non-expectorated)     Status: None   Collection Time: 07/27/19  7:55 PM   Specimen: Tracheal Aspirate; Respiratory  Result Value Ref Range Status   Specimen Description   Final    TRACHEAL ASPIRATE Performed at [facility], [address] [place], [place] [address]    Special Requests NONE  Final   Gram Stain   Final    FEW WBC PRESENT, PREDOMINANTLY PMN RARE SQUAMOUS EPITHELIAL CELLS PRESENT ABUNDANT GRAM POSITIVE COCCI MODERATE GRAM POSITIVE RODS    Culture   Final    Consistent with normal respiratory flora. Performed at [facility], [place] [address] [place], [place] [address]    Report Status 07/29/2019 FINAL  Final  Culture, blood (Routine X 2) w Reflex to ID Panel     Status: None (Preliminary result)   Collection Time: 07/27/19 11:59 PM   Specimen: BLOOD  Result Value Ref Range Status   Specimen Description BLOOD LEFT HAND  Final   Special Requests   Final    BOTTLES DRAWN AEROBIC AND ANAEROBIC Blood  Culture adequate volume   Culture   Final    NO GROWTH 2 DAYS Performed at [facility], [address], [place], [place] [address]    Report Status PENDING  Incomplete    Best Practice/Protocols:  VTE Prophylaxis: Mechanical GI Prophylaxis: Proton Pump Inhibitor CIWA/ sedation protocols  Events: 2/28-patient is having bloody aspirate from endotracheal tube, will hold off on extubation today. 3/01-agitated but redirectable, tolerating spontaneous breathing trial, plan extubation   Studies: CT ABDOMEN PELVIS WO CONTRAST  Result Date: 07/28/2019 CLINICAL DATA:  Nausea and vomiting. Hepatitis C. Tobacco abuse. Alcohol abuse. Cirrhosis. Hepatic encephalopathy. Mental status changes. EXAM: CT ABDOMEN AND PELVIS WITHOUT CONTRAST TECHNIQUE: Multidetector CT imaging of the abdomen and pelvis was performed following the standard protocol without IV contrast. COMPARISON:  Plain films 07/27/2019. Ultrasound 02/12/2019. No prior CT. FINDINGS: Lower chest: Bibasilar atelectasis.  Normal heart size. Hepatobiliary: Liver not well evaluated secondary to lack of IV contrast and patient arm position. Hepatomegaly at 22 cm. There also extensive EKG and wire lead artifacts. Mild cirrhosis. Nonspecific gallbladder wall thickening is suspected. No calcified stone or  pericholecystic edema. No biliary duct dilatation. Pancreas: Normal, without mass or ductal dilatation. Spleen: Normal in size, without focal abnormality. Adrenals/Urinary Tract: Normal adrenal glands. No renal calculi or hydronephrosis. Foley catheter within the urinary bladder. Stomach/Bowel: Tiny hiatal hernia. Nasogastric tube terminating at the gastric body. Rectal catheter. Normal colon, appendix, and terminal ileum. Normal small bowel. Vascular/Lymphatic: Aortic atherosclerosis. No abdominopelvic adenopathy. Reproductive: Normal prostate. Other: Trace pelvic fluid.  No free intraperitoneal air. Musculoskeletal: No acute osseous  abnormality. Degenerative disc disease at L4-5 and L5-S1. IMPRESSION: 1. Multifactorial degradation, primarily involving the upper abdomen, as detailed above. 2.  No acute process in the abdomen or pelvis. 3. Cirrhosis and hepatomegaly. 4. Trace fluid within the pelvis. 5.  Aortic Atherosclerosis (ICD10-I70.0). Electronically Signed   By: Jeronimo Greaves M.D.   On: 07/28/2019 13:48   CT HEAD WO CONTRAST  Result Date: 07/28/2019 CLINICAL DATA:  Encephalopathy. EXAM: CT HEAD WITHOUT CONTRAST TECHNIQUE: Contiguous axial images were obtained from the base of the skull through the vertex without intravenous contrast. COMPARISON:  May 30, 2019 FINDINGS: Brain: No evidence of acute infarction, hemorrhage, hydrocephalus, extra-axial collection or mass lesion/mass effect. Vascular: No hyperdense vessel or unexpected calcification. Skull: Normal. Negative for fracture or focal lesion. Sinuses/Orbits: Mild sphenoid and scattered ethmoid sinus disease. Visualized portions the orbits are unremarkable. Other: None. IMPRESSION: 1. No evidence of acute intracranial pathology. 2. Mild sphenoid and scattered ethmoid sinus disease. Electronically Signed   By: Donzetta Kohut M.D.   On: 07/28/2019 13:41   DG Chest Port 1 View  Result Date: 07/30/2019 CLINICAL DATA:  Hypoxia EXAM: PORTABLE CHEST 1 VIEW COMPARISON:  July 29, 2019 FINDINGS: Endotracheal tube is seen 3 cm above the carina. NG tube is seen below the diaphragm. There is interval slight worsening in the hazy/patchy airspace opacity within the right lower lung. Again noted is patchy air left infrahilar opacity. No pleural effusion is seen. No acute osseous abnormality. IMPRESSION: ET tube and NG tube in satisfactory position. Multifocal airspace opacities, with interval worsening in the right lower lung. Electronically Signed   By: Jonna Clark M.D.   On: 07/30/2019 04:36   DG Chest Port 1 View  Result Date: 07/29/2019 CLINICAL DATA:  Intubated EXAM: PORTABLE  CHEST 1 VIEW COMPARISON:  07/28/2019 FINDINGS: Endotracheal tube with the tip 3.4 cm above the carina. Nasogastric tube with the tip projecting over the stomach. Bilateral patchy interstitial and alveolar airspace opacities. No pleural effusion or pneumothorax. Stable cardiomediastinal silhouette. No acute osseous abnormality. IMPRESSION: 1. Endotracheal tube with the tip 3.4 cm above the carina. 2. Bilateral interstitial and patchy alveolar airspace opacities which may reflect mild pulmonary edema versus multilobar pneumonia. Electronically Signed   By: Elige Ko   On: 07/29/2019 10:48   DG Chest Port 1 View  Result Date: 07/28/2019 CLINICAL DATA:  Endotracheal tube EXAM: PORTABLE CHEST 1 VIEW COMPARISON:  07/28/2019 FINDINGS: The endotracheal tube terminates approximately 4.2 cm above the carina. The enteric tube extends below the left hemidiaphragm. There are perihilar airspace opacities with prominent interstitial lung markings. No pneumothorax. No convincing pleural effusion. The heart size is stable from prior study. IMPRESSION: Satisfactory positioning of the endotracheal and enteric tubes. Developing perihilar airspace opacities which may represent pulmonary edema in the appropriate clinical setting. Electronically Signed   By: Katherine Mantle M.D.   On: 07/28/2019 23:34   DG Chest Port 1 View  Result Date: 07/28/2019 CLINICAL DATA:  Acute respiratory failure, intubated EXAM: PORTABLE CHEST 1 VIEW COMPARISON:  07/27/2019 FINDINGS: Single frontal view of the chest demonstrates stable position of the endotracheal tube and enteric catheters. The cardiac silhouette is unchanged. Stable diffuse interstitial prominence without airspace disease, effusion, or pneumothorax. There is mild central vascular congestion. IMPRESSION: 1. Central vascular congestion without superimposed airspace disease. 2. Stable support devices. Electronically Signed   By: Sharlet Salina M.D.   On: 07/28/2019 03:20   DG  Chest Port 1 View  Result Date: 07/27/2019 CLINICAL DATA:  Intubated, hepatic encephalopathy EXAM: PORTABLE CHEST 1 VIEW COMPARISON:  05/31/2019 FINDINGS: Single frontal view of the chest demonstrates endotracheal tube overlying tracheal air column tip just below thoracic inlet. Enteric catheter tip and side port project over gastric body. Cardiac silhouette is unremarkable. Diffuse interstitial prominence throughout the lungs consistent with history of tobacco abuse, stable. No airspace disease, effusion, or pneumothorax. IMPRESSION: 1. Chronic interstitial lung disease, no acute process. 2. No complication after intubation. Electronically Signed   By: Sharlet Salina M.D.   On: 07/27/2019 19:44   DG Abd Portable 1V  Result Date: 07/27/2019 CLINICAL DATA:  Enteric catheter placement, hepatic encephalopathy EXAM: PORTABLE ABDOMEN - 1 VIEW COMPARISON:  None. FINDINGS: Supine frontal view of the lower chest and upper abdomen demonstrates enteric catheter tip and side port projecting over gastric body. Bowel gas pattern is unremarkable. No bowel obstruction or ileus. No masses or abnormal calcifications. Lung bases are clear. IMPRESSION: 1. Enteric catheter overlying gastric body. Electronically Signed   By: Sharlet Salina M.D.   On: 07/27/2019 19:43   DG Hand Complete Right  Result Date: 07/27/2019 CLINICAL DATA:  Right hand injury EXAM: RIGHT HAND - COMPLETE 3+ VIEW COMPARISON:  05/30/2019 FINDINGS: Frontal, oblique, and lateral views of the right hand are obtained. Prior healed distal right radial fracture with resulting dorsal angulation of the radiocarpal joint unchanged. No acute displaced fractures. Likely previous healed fracture distal aspect right fifth metacarpal. There is radiocarpal joint space narrowing. Remaining joint spaces are relatively well preserved. Soft tissues appear normal. IMPRESSION: 1. Sequela of chronic healed distal right radial fracture, with stable dorsal angulation and  radiocarpal osteoarthritis. 2. No acute bony abnormality of the right hand. Electronically Signed   By: Sharlet Salina M.D.   On: 07/27/2019 19:46    Consults:    Subjective:    Overnight Issues: Requiring large amounts of sedation.  Precedex on board.  Tolerating spontaneous breathing trial.  Objective:  Vital signs for last 24 hours: Temp:  [96.8 F (36 C)-98.2 F (36.8 C)] 98.1 F (36.7 C) (03/01 1929) Pulse Rate:  [48-90] 72 (03/01 2100) Resp:  [5-28] 5 (03/01 2100) BP: (90-136)/(57-90) 118/58 (03/01 2100) SpO2:  [89 %-100 %] 93 % (03/01 2100) FiO2 (%):  [36 %-60 %] 36 % (03/01 1925)  Hemodynamic parameters for last 24 hours:    Intake/Output from previous day: 02/28 0701 - 03/01 0700 In: 5607.7 [I.V.:4637.7; NG/GT:770; IV Piggyback:200] Out: 2625 [Urine:1525; Emesis/NG output:1100]  Intake/Output this shift: Total I/O In: 480 [I.V.:400.8; IV Piggyback:79.2] Out: 125 [Urine:125]  Vent settings for last 24 hours: Vent Mode: PRVC FiO2 (%):  [36 %-60 %] 36 % Set Rate:  [16 bmp] 16 bmp Vt Set:  [550 mL] 550 mL PEEP:  [5 cmH20-10 cmH20] 10 cmH20 Plateau Pressure:  [16 cmH20-19 cmH20] 19 cmH20  Physical Exam:  GENERAL: Sedated but arousable, somewhat agitated but redirectable. HEAD: Normocephalic, atraumatic.  EYES: Pupils equal, round, reactive to light.  No scleral icterus.  MOUTH: Orotracheally intubated, OG in place. NECK: Supple.  No thyromegaly. No nodules. No JVD.  Trachea midline. PULMONARY: Rhonchi at bases, no wheezes.  Synchronous with vent. CARDIOVASCULAR: S1 and S2. Regular rate and rhythm.  No rubs, murmurs or gallops appreciated GASTROINTESTINAL: Soft, nondistended, normoactive bowel sounds. MUSCULOSKELETAL: No joint deformity, no clubbing, no edema.  NEUROLOGIC: Agitated.  Moves all 4 spontaneously, no overt focal deficit. SKIN: Intact,warm,dry.  No rashes. PSYCH: Agitated but able to redirect.  Assessment/Plan:  Acute Hypoxic Respiratory  Failure Aspiration pneumonia Due to aspiration of bloody vomitus/epistaxis Status post emergent endotracheal intubation continue Bronchodilator Therapy Wean Fio2 and PEEP as tolerated Tolerating SBT, consider extubation today Unasyn for aspiration pneumonia   Acute encephalopathy Alcohol withdrawal Element of hepatic encephalopathy Ammonia level has responded to interventions CIWA for alcohol withdrawal Phenobarbital for withdrawal/agitation   Acute upper GI bleed Continue Protonix 40 mg twice daily GI consultants recommended medical management only Monitor H&H, transfuse as needed for Hgb less than 7 Lactulose for elevated ammonia levels    Transaminitis Alcoholic hepatitis Supportive care   Moderate protein calorie malnutrition On tube feeds Electrolyte replacement as necessary  Alcoholism/substance abuse High-dose thiamine Multivitamin supplementation Precedex for withdrawal issues     LOS: 3 days   Additional comments: Multidisciplinary rounds were performed with ICU team, wife was updated at bedside.  Critical Care Total Time*: 45 Minutes  C. Danice Goltz, MD [facility] P[facility] 07/30/2019  *Care during the described time interval was provided by me and/or other providers on the critical care team.  I have reviewed this patient's available data, including medical history, events of note, physical examination and test results as part of my evaluation.  **This note was dictated using voice recognition software/Dragon.  Despite best efforts to proofread, errors can occur which can change the meaning.  Any change was purely unintentional.

## 2019-07-30 NOTE — Progress Notes (Signed)
1345 extubated to 6 L nasal canula.

## 2019-07-30 NOTE — Progress Notes (Addendum)
1400 remains extubated but wild Precedex increased to 1.0 per MD orders.1430 Patient still agitated and fighting. Bilateral wrist restraints remain in place. Still trying to get out of bed with legs and sitting up. Treated with Ativan infrequently- remained on Precedex drip 1.0-1.2 all day

## 2019-07-31 ENCOUNTER — Inpatient Hospital Stay: Payer: No Typology Code available for payment source

## 2019-07-31 DIAGNOSIS — Z720 Tobacco use: Secondary | ICD-10-CM

## 2019-07-31 LAB — BASIC METABOLIC PANEL
Anion gap: 8 (ref 5–15)
BUN: 11 mg/dL (ref 6–20)
CO2: 21 mmol/L — ABNORMAL LOW (ref 22–32)
Calcium: 7.7 mg/dL — ABNORMAL LOW (ref 8.9–10.3)
Chloride: 112 mmol/L — ABNORMAL HIGH (ref 98–111)
Creatinine, Ser: 0.82 mg/dL (ref 0.61–1.24)
GFR calc Af Amer: >60 mL/min (ref >60)
GFR calc non Af Amer: >60 mL/min (ref >60)
Glucose, Bld: 126 mg/dL — ABNORMAL HIGH (ref 70–99)
Potassium: 3.6 mmol/L (ref 3.5–5.1)
Sodium: 141 mmol/L (ref 135–145)

## 2019-07-31 LAB — BLOOD GAS, ARTERIAL
Acid-base deficit: 1.3 mmol/L (ref 0.0–2.0)
Bicarbonate: 22.7 mmol/L (ref 20.0–28.0)
FIO2: 1
O2 Saturation: 87.7 %
Patient temperature: 37
pCO2 arterial: 35 mmHg (ref 32.0–48.0)
pH, Arterial: 7.42 (ref 7.350–7.450)
pO2, Arterial: 53 mmHg — ABNORMAL LOW (ref 83.0–108.0)

## 2019-07-31 LAB — CBC WITH DIFFERENTIAL/PLATELET
Abs Immature Granulocytes: 0.03 10*3/uL (ref 0.00–0.07)
Basophils Absolute: 0.1 10*3/uL (ref 0.0–0.1)
Basophils Relative: 1 %
Eosinophils Absolute: 0.1 10*3/uL (ref 0.0–0.5)
Eosinophils Relative: 1 %
HCT: 27.8 % — ABNORMAL LOW (ref 39.0–52.0)
Hemoglobin: 9.4 g/dL — ABNORMAL LOW (ref 13.0–17.0)
Immature Granulocytes: 0 %
Lymphocytes Relative: 12 %
Lymphs Abs: 1.1 10*3/uL (ref 0.7–4.0)
MCH: 31.9 pg (ref 26.0–34.0)
MCHC: 33.8 g/dL (ref 30.0–36.0)
MCV: 94.2 fL (ref 80.0–100.0)
Monocytes Absolute: 0.6 10*3/uL (ref 0.1–1.0)
Monocytes Relative: 7 %
Neutro Abs: 7 10*3/uL (ref 1.7–7.7)
Neutrophils Relative %: 79 %
Platelets: 129 10*3/uL — ABNORMAL LOW (ref 150–400)
RBC: 2.95 MIL/uL — ABNORMAL LOW (ref 4.22–5.81)
RDW: 13.6 % (ref 11.5–15.5)
WBC: 8.9 10*3/uL (ref 4.0–10.5)
nRBC: 0 % (ref 0.0–0.2)

## 2019-07-31 LAB — GLUCOSE, CAPILLARY
Glucose-Capillary: 121 mg/dL — ABNORMAL HIGH (ref 70–99)
Glucose-Capillary: 122 mg/dL — ABNORMAL HIGH (ref 70–99)
Glucose-Capillary: 126 mg/dL — ABNORMAL HIGH (ref 70–99)
Glucose-Capillary: 128 mg/dL — ABNORMAL HIGH (ref 70–99)
Glucose-Capillary: 129 mg/dL — ABNORMAL HIGH (ref 70–99)
Glucose-Capillary: 140 mg/dL — ABNORMAL HIGH (ref 70–99)

## 2019-07-31 LAB — MAGNESIUM: Magnesium: 2 mg/dL (ref 1.7–2.4)

## 2019-07-31 LAB — HEMOGLOBIN AND HEMATOCRIT, BLOOD
HCT: 27.5 % — ABNORMAL LOW (ref 39.0–52.0)
HCT: 29.7 % — ABNORMAL LOW (ref 39.0–52.0)
HCT: 28.3 % — ABNORMAL LOW (ref 39.0–52.0)
Hemoglobin: 9.6 g/dL — ABNORMAL LOW (ref 13.0–17.0)
Hemoglobin: 10.1 g/dL — ABNORMAL LOW (ref 13.0–17.0)
Hemoglobin: 9.9 g/dL — ABNORMAL LOW (ref 13.0–17.0)

## 2019-07-31 LAB — PHOSPHORUS: Phosphorus: 2.4 mg/dL — ABNORMAL LOW (ref 2.5–4.6)

## 2019-07-31 MED ORDER — PHENOBARBITAL SODIUM 65 MG/ML IJ SOLN
40.0000 mg | Freq: Three times a day (TID) | INTRAMUSCULAR | Status: DC
  Administered 2019-07-31 – 2019-08-01 (×3): 40 mg via INTRAVENOUS
  Filled 2019-07-31 (×3): qty 1

## 2019-07-31 MED ORDER — FENTANYL CITRATE (PF) 100 MCG/2ML IJ SOLN
INTRAMUSCULAR | Status: AC
  Administered 2019-07-31: 50 ug via INTRAVENOUS
  Filled 2019-07-31: qty 2

## 2019-07-31 MED ORDER — IPRATROPIUM-ALBUTEROL 0.5-2.5 (3) MG/3ML IN SOLN
3.0000 mL | RESPIRATORY_TRACT | Status: DC
  Administered 2019-07-31 – 2019-08-01 (×7): 3 mL via RESPIRATORY_TRACT
  Filled 2019-07-31 (×7): qty 3

## 2019-07-31 MED ORDER — DIAZEPAM 5 MG/ML IJ SOLN
5.0000 mg | Freq: Four times a day (QID) | INTRAMUSCULAR | Status: DC | PRN
  Administered 2019-07-31 – 2019-08-04 (×7): 5 mg via INTRAVENOUS
  Filled 2019-07-31 (×7): qty 2

## 2019-07-31 MED ORDER — FUROSEMIDE 10 MG/ML IJ SOLN
40.0000 mg | Freq: Once | INTRAMUSCULAR | Status: AC
  Administered 2019-07-31: 40 mg via INTRAVENOUS
  Filled 2019-07-31: qty 4

## 2019-07-31 MED ORDER — ZIPRASIDONE MESYLATE 20 MG IM SOLR
20.0000 mg | Freq: Once | INTRAMUSCULAR | Status: AC
  Administered 2019-07-31: 20 mg via INTRAMUSCULAR
  Filled 2019-07-31: qty 20

## 2019-07-31 MED ORDER — FENTANYL CITRATE (PF) 100 MCG/2ML IJ SOLN: 50.0000 ug | Freq: Once | INTRAMUSCULAR | Status: AC

## 2019-07-31 MED ORDER — FUROSEMIDE 10 MG/ML IJ SOLN
20.0000 mg | Freq: Once | INTRAMUSCULAR | Status: AC
  Administered 2019-07-31: 20 mg via INTRAVENOUS
  Filled 2019-07-31: qty 2

## 2019-07-31 MED ORDER — NICOTINE 21 MG/24HR TD PT24
21.0000 mg | MEDICATED_PATCH | Freq: Every day | TRANSDERMAL | Status: DC
  Administered 2019-07-31 – 2019-08-05 (×7): 21 mg via TRANSDERMAL
  Filled 2019-07-31 (×8): qty 1

## 2019-07-31 MED ORDER — FENTANYL CITRATE (PF) 100 MCG/2ML IJ SOLN
25.0000 ug | INTRAMUSCULAR | Status: DC | PRN
  Administered 2019-07-31 – 2019-08-03 (×3): 50 ug via INTRAVENOUS
  Filled 2019-07-31 (×3): qty 2

## 2019-07-31 MED ORDER — ALBUTEROL SULFATE (2.5 MG/3ML) 0.083% IN NEBU: 2.5000 mg | INHALATION_SOLUTION | RESPIRATORY_TRACT | Status: DC | PRN

## 2019-07-31 MED ORDER — DIAZEPAM 5 MG/ML IJ SOLN
2.5000 mg | INTRAMUSCULAR | Status: AC
  Administered 2019-07-31: 2.5 mg via INTRAVENOUS

## 2019-07-31 MED ORDER — DIAZEPAM 5 MG/ML IJ SOLN
INTRAMUSCULAR | Status: AC
  Filled 2019-07-31: qty 2

## 2019-07-31 MED ORDER — PHENOBARBITAL SODIUM 65 MG/ML IJ SOLN
30.0000 mg | Freq: Three times a day (TID) | INTRAMUSCULAR | Status: DC
  Administered 2019-07-31: 30 mg via INTRAVENOUS
  Filled 2019-07-31: qty 1

## 2019-07-31 MED ORDER — POTASSIUM PHOSPHATES 15 MMOLE/5ML IV SOLN
10.0000 mmol | Freq: Once | INTRAVENOUS | Status: AC
  Administered 2019-07-31: 10 mmol via INTRAVENOUS
  Filled 2019-07-31: qty 3.33

## 2019-07-31 MED ORDER — BISACODYL 10 MG RE SUPP
10.0000 mg | Freq: Every day | RECTAL | Status: DC | PRN
  Administered 2019-07-31: 10 mg via RECTAL
  Filled 2019-07-31: qty 1

## 2019-07-31 MED ORDER — IPRATROPIUM-ALBUTEROL 0.5-2.5 (3) MG/3ML IN SOLN
RESPIRATORY_TRACT | Status: AC
  Administered 2019-07-31: 3 mL
  Filled 2019-07-31: qty 3

## 2019-07-31 NOTE — Progress Notes (Signed)
Pt extremely restless and agitated secondary to ETOH withdrawal symptoms despite receiving IM geodon, multiple doses of ativan per CIWA protocol, iv fentanyl for possible pain, and precedex gtt currently infusing at 1.2 mcg/kg/hr.  He is currently on 95% HFNC, which is concerning he will eventually not be able to protect his airway due to sedating medication requirements.  I spoke with pts wife Karron Alvizo via telephone and informed her pt will likely require reintubation.  Mrs. Brunkhorst stated she would like to see the pt first before making the decision to reintubate.  I told Mrs. Mcenery I would allow her to see the pt first.  Dr. Francene Boyers agreeable with current plan of care as outlined above, and also recommended starting iv phenobarbital 30 mg tid to see if this would improve his withdrawal symptoms.  Will continue to monitor and assess pt.  Sonda Rumble, AGNP  Pulmonary/Critical Care Pager [phone] (please enter 7 digits) PCCM Consult Pager [phone] (please enter 7 digits)

## 2019-07-31 NOTE — Progress Notes (Signed)
Pts wife Archibald Marchetta is currently at bedside and stated if possible can we hold off on mechanical intubation for now.  She would like to stay at pts bedside and see if she can possibly calm him down.  Will continue to monitor and assess pt  Sonda Rumble, [facility]  Pulmonary/Critical Care Pager [phone] (please enter 7 digits) PCCM Consult Pager [phone] (please enter 7 digits)

## 2019-07-31 NOTE — Progress Notes (Signed)
Pharmacy Electrolyte Monitoring Consult:  Pharmacy consulted to assist in monitoring and replacing electrolytes in this 49 y.o. male admitted on 07/27/2019 with alcohol withdrawal and a GI bleed. Patient is extubated and sedated on Fentanyl and Propofol infusions. Patient with past medical history significant for Hepatitis C, Alcohol abuse, hepatic encephalopathy.   Labs:  Sodium (mmol/L)  Date Value  07/31/2019 141  10/13/2012 133 (L)   Potassium (mmol/L)  Date Value  07/31/2019 3.6  10/13/2012 5.0   Magnesium (mg/dL)  Date Value  59/53/9672 2.0   Phosphorus (mg/dL)  Date Value  89/79/1504 2.4 (L)   Calcium (mg/dL)  Date Value  13/64/3837 7.7 (L)   Calcium, Total (mg/dL)  Date Value  79/39/6886 8.2 (L)   Albumin (g/dL)  Date Value  48/47/2072 2.7 (L)  10/13/2012 4.0   Corrected Ca: 8.74 mg/dL  Assessment/Plan: 1. Electrolytes: Goals of Therapy: K 4.0 - 5.1 mmol/L, Mg 2.0 - 2.4 mg/dL, others wnl  Replace with 15 mmol IV potassium phosphate (this provides 14.5 mEq IV potassium)  Re-check electrolytes with morning labs  2. Glucose: no history of diabetes noted. Will monitor via BMPs and initiate SSI as appropriate.  3. Constipation: patient being evaluated for possible GI bleed and is NPO. Will continue to monitor.   Pharmacy will continue to monitor and adjust per consult.    Lowella Bandy, PharmD 07/31/2019 7:19 AM

## 2019-07-31 NOTE — Progress Notes (Signed)
0900 Better day. Restraints and mitts off. Calmed down. Alternating between Fentanyl and Valium for sedation not Ativan. Better calming results from that combo of medications. Wife in and out. Patient's speech less garbled and more understandable. Safety sitter still needed.

## 2019-07-31 NOTE — Progress Notes (Addendum)
Patient had been restless at shift. Sitter at bedside. Patient been mouth breathing with 4LNC on and desatting to 80s,. Place NRB on, which agitated the patient more. Later, patient became combative. Charge RN and ICU NP came to bedside. ICU NP gave new orders for Geodon IM, and to increase Precedex rate to 1.5. 4 mg of ativan also given. Patient now resting with sitter at bedside. Will continue to monitor.

## 2019-07-31 NOTE — Progress Notes (Addendum)
Wife remains at bedside with patient. States she does not want patient re-intubated unless absolutely necessary. Wife is aware of his high respiratory rate and work of breathing. Also aware of extra oxygen need. Patient remains combative, trying to get OOB and pulling at oxygen mask. 0900 ABG done CO2 within normal range but PO2 low. Patient is also a smoker. Dr. Marcos Eke aware.

## 2019-07-31 NOTE — Progress Notes (Signed)
Follow up - Critical Care Medicine Note  Patient Details:    Joel Tanner Sr. is an 49 y.o. malewith medical history significant ofhepatitis C, tobacco abuse, alcohol abuse,cirrhosis, seizures, GERD, hyperlipidemia, hepatic encephalopathy requiring intubation in the past, who presents with altered mental status. Per EDP, pt's mother reported thatpatient was at work today,but was altered, acting abnormally and appeared quite confused.  On arrival pt was encephalopathic with active epistaxis and hematemesis  Pt with elevated venous ammonia and etoh levels.  Patient had aspiration event.  Required intubation and mechanical ventilation  Lines, Airways, Drains: Urethral Catheter merline G Straight-tip 16 Fr. (Active)  Indication for Insertion or Continuance of Catheter Acute urinary retention (I&O Cath for 24 hrs prior to catheter insertion- Inpatient Only) 07/30/19 1928  Site Assessment Clean;Intact 07/30/19 1928  Catheter Maintenance Bag below level of bladder;Drainage bag/tubing not touching floor;No dependent loops 07/30/19 1928  Collection Container Standard drainage bag 07/30/19 1928  Securement Method Securing device (Describe) 07/30/19 1928  Urinary Catheter Interventions (if applicable) Unclamped 07/30/19 1928  Output (mL) 125 mL 07/30/19 1929    Anti-infectives:  Anti-infectives (From admission, onward)   Start     Dose/Rate Route Frequency Ordered Stop   07/30/19 2045  Ampicillin-Sulbactam (UNASYN) 3 g in sodium chloride 0.9 % 100 mL IVPB     3 g 200 mL/hr over 30 Minutes Intravenous Every 6 hours 07/30/19 1515     07/30/19 1200  Ampicillin-Sulbactam (UNASYN) 3 g in sodium chloride 0.9 % 100 mL IVPB  Status:  Discontinued     3 g 200 mL/hr over 30 Minutes Intravenous Every 12 hours 07/30/19 1049 07/30/19 1515   07/27/19 1945  cefTRIAXone (ROCEPHIN) 2 g in sodium chloride 0.9 % 100 mL IVPB  Status:  Discontinued     2 g 200 mL/hr over 30 Minutes Intravenous Every 24 hours  07/27/19 1931 07/30/19 1049   07/27/19 1930  cefTRIAXone (ROCEPHIN) 1 g in sodium chloride 0.9 % 100 mL IVPB  Status:  Discontinued     1 g 200 mL/hr over 30 Minutes Intravenous Every 24 hours 07/27/19 1903 07/27/19 1931      Microbiology: Results for orders placed or performed during the hospital encounter of 07/27/19  Respiratory Panel by RT PCR (Flu A&B, Covid) - Nasopharyngeal Swab     Status: None   Collection Time: 07/27/19  3:34 PM   Specimen: Nasopharyngeal Swab  Result Value Ref Range Status   SARS Coronavirus 2 by RT PCR NEGATIVE NEGATIVE Final    Comment: (NOTE) SARS-CoV-2 target nucleic acids are NOT DETECTED. The SARS-CoV-2 RNA is generally detectable in upper respiratoy specimens during the acute phase of infection. The lowest concentration of SARS-CoV-2 viral copies this assay can detect is 131 copies/mL. A negative result does not preclude SARS-Cov-2 infection and should not be used as the sole basis for treatment or other patient management decisions. A negative result may occur with  improper specimen collection/handling, submission of specimen other than nasopharyngeal swab, presence of viral mutation(s) within the areas targeted by this assay, and inadequate number of viral copies (<131 copies/mL). A negative result must be combined with clinical observations, patient history, and epidemiological information. The expected result is Negative. Fact Sheet for Patients:  https://www.moore.com/ Fact Sheet for Healthcare Providers:  https://www.young.biz/ This test is not yet ap proved or cleared by the [place] FDA and  has been authorized for detection and/or diagnosis of SARS-CoV-2 by FDA under an Emergency Use Authorization (EUA). This EUA will  remain  in effect (meaning this test can be used) for the duration of the COVID-19 declaration under Section 564(b)(1) of the Act, 21 U.S.C. section 360bbb-3(b)(1), unless the  authorization is terminated or revoked sooner.    Influenza A by PCR NEGATIVE NEGATIVE Final   Influenza B by PCR NEGATIVE NEGATIVE Final    Comment: (NOTE) The Xpert Xpress SARS-CoV-2/FLU/RSV assay is intended as an aid in  the diagnosis of influenza from Nasopharyngeal swab specimens and  should not be used as a sole basis for treatment. Nasal washings and  aspirates are unacceptable for Xpert Xpress SARS-CoV-2/FLU/RSV  testing. Fact Sheet for Patients: https://www.moore.com/ Fact Sheet for Healthcare Providers: https://www.young.biz/ This test is not yet approved or cleared by the [place] FDA and  has been authorized for detection and/or diagnosis of SARS-CoV-2 by  FDA under an Emergency Use Authorization (EUA). This EUA will remain  in effect (meaning this test can be used) for the duration of the  Covid-19 declaration under Section 564(b)(1) of the Act, 21  U.S.C. section 360bbb-3(b)(1), unless the authorization is  terminated or revoked. Performed at [facility], [address] [place], [place] [address]   MRSA PCR Screening     Status: None   Collection Time: 07/27/19  5:39 PM   Specimen: Nasopharyngeal Swab  Result Value Ref Range Status   MRSA by PCR NEGATIVE NEGATIVE Final    Comment:        The GeneXpert MRSA Assay (FDA approved for NASAL specimens only), is one component of a comprehensive MRSA colonization surveillance program. It is not intended to diagnose MRSA infection nor to guide or monitor treatment for MRSA infections. Performed at [facility], [address] [place], [place] [address]   Respiratory Panel by RT PCR (Flu A&B, Covid) - Nasopharyngeal Swab     Status: None   Collection Time: 07/27/19  5:39 PM   Specimen: Nasopharyngeal Swab  Result Value Ref Range Status   SARS Coronavirus 2 by RT PCR NEGATIVE NEGATIVE Final    Comment: (NOTE) SARS-CoV-2 target nucleic acids are NOT  DETECTED. The SARS-CoV-2 RNA is generally detectable in upper respiratoy specimens during the acute phase of infection. The lowest concentration of SARS-CoV-2 viral copies this assay can detect is 131 copies/mL. A negative result does not preclude SARS-Cov-2 infection and should not be used as the sole basis for treatment or other patient management decisions. A negative result may occur with  improper specimen collection/handling, submission of specimen other than nasopharyngeal swab, presence of viral mutation(s) within the areas targeted by this assay, and inadequate number of viral copies (<131 copies/mL). A negative result must be combined with clinical observations, patient history, and epidemiological information. The expected result is Negative. Fact Sheet for Patients:  https://www.moore.com/ Fact Sheet for Healthcare Providers:  https://www.young.biz/ This test is not yet ap proved or cleared by the [place] FDA and  has been authorized for detection and/or diagnosis of SARS-CoV-2 by FDA under an Emergency Use Authorization (EUA). This EUA will remain  in effect (meaning this test can be used) for the duration of the COVID-19 declaration under Section 564(b)(1) of the Act, 21 U.S.C. section 360bbb-3(b)(1), unless the authorization is terminated or revoked sooner.    Influenza A by PCR NEGATIVE NEGATIVE Final   Influenza B by PCR NEGATIVE NEGATIVE Final    Comment: (NOTE) The Xpert Xpress SARS-CoV-2/FLU/RSV assay is intended as an aid in  the diagnosis of influenza from Nasopharyngeal swab specimens and  should not be used as a sole basis for treatment. Nasal washings and  aspirates are unacceptable for Xpert Xpress SARS-CoV-2/FLU/RSV  testing. Fact Sheet for Patients: PinkCheek.be Fact Sheet for Healthcare Providers: GravelBags.it This test is not yet approved or cleared  by the [place] FDA and  has been authorized for detection and/or diagnosis of SARS-CoV-2 by  FDA under an Emergency Use Authorization (EUA). This EUA will remain  in effect (meaning this test can be used) for the duration of the  Covid-19 declaration under Section 564(b)(1) of the Act, 21  U.S.C. section 360bbb-3(b)(1), unless the authorization is  terminated or revoked. Performed at [facility], [address] [place], [place] [address]   CULTURE, BLOOD (ROUTINE X 2) w Reflex to ID Panel     Status: None (Preliminary result)   Collection Time: 07/27/19  7:29 PM   Specimen: BLOOD  Result Value Ref Range Status   Specimen Description BLOOD BLOOD RIGHT HAND  Final   Special Requests   Final    BOTTLES DRAWN AEROBIC AND ANAEROBIC Blood Culture adequate volume   Culture   Final    NO GROWTH 4 DAYS Performed at [facility], [address], [place], [place] [address]    Report Status PENDING  Incomplete  Culture, respiratory (non-expectorated)     Status: None   Collection Time: 07/27/19  7:55 PM   Specimen: Tracheal Aspirate; Respiratory  Result Value Ref Range Status   Specimen Description   Final    TRACHEAL ASPIRATE Performed at [facility], [address] [place], [place] [address]    Special Requests NONE  Final   Gram Stain   Final    FEW WBC PRESENT, PREDOMINANTLY PMN RARE SQUAMOUS EPITHELIAL CELLS PRESENT ABUNDANT GRAM POSITIVE COCCI MODERATE GRAM POSITIVE RODS    Culture   Final    Consistent with normal respiratory flora. Performed at [facility], [address], [place], [place] [address]    Report Status 07/29/2019 FINAL  Final  Culture, blood (Routine X 2) w Reflex to ID Panel     Status: None (Preliminary result)   Collection Time: 07/27/19 11:59 PM   Specimen: BLOOD  Result Value Ref Range Status   Specimen Description BLOOD LEFT HAND  Final   Special Requests   Final    BOTTLES DRAWN AEROBIC AND ANAEROBIC Blood  Culture adequate volume   Culture   Final    NO GROWTH 3 DAYS Performed at [facility], [address] [place], [place] [address]    Report Status PENDING  Incomplete    Best Practice/Protocols:  VTE Prophylaxis: Mechanical GI Prophylaxis: Proton Pump Inhibitor CIWA/ sedation protocols  Events: 2/28-patient is having bloody aspirate from endotracheal tube, will hold off on extubation today. 3/01-agitated but redirectable, tolerating spontaneous breathing trial, plan extubation 3/02-severe agitation overnight started IV phenobarbital, switch benzodiazepine to Valium (per wife works best for patient)   Studies: CT ABDOMEN PELVIS WO CONTRAST  Result Date: 07/28/2019 CLINICAL DATA:  Nausea and vomiting. Hepatitis C. Tobacco abuse. Alcohol abuse. Cirrhosis. Hepatic encephalopathy. Mental status changes. EXAM: CT ABDOMEN AND PELVIS WITHOUT CONTRAST TECHNIQUE: Multidetector CT imaging of the abdomen and pelvis was performed following the standard protocol without IV contrast. COMPARISON:  Plain films 07/27/2019. Ultrasound 02/12/2019. No prior CT. FINDINGS: Lower chest: Bibasilar atelectasis.  Normal heart size. Hepatobiliary: Liver not well evaluated secondary to lack of IV contrast and patient arm position. Hepatomegaly at 22 cm. There also extensive EKG  and wire lead artifacts. Mild cirrhosis. Nonspecific gallbladder wall thickening is suspected. No calcified stone or pericholecystic edema. No biliary duct dilatation. Pancreas: Normal, without mass or ductal dilatation. Spleen: Normal in size, without focal abnormality. Adrenals/Urinary Tract: Normal adrenal glands. No renal calculi or hydronephrosis. Foley catheter within the urinary bladder. Stomach/Bowel: Tiny hiatal hernia. Nasogastric tube terminating at the gastric body. Rectal catheter. Normal colon, appendix, and terminal ileum. Normal small bowel. Vascular/Lymphatic: Aortic atherosclerosis. No abdominopelvic adenopathy.  Reproductive: Normal prostate. Other: Trace pelvic fluid.  No free intraperitoneal air. Musculoskeletal: No acute osseous abnormality. Degenerative disc disease at L4-5 and L5-S1. IMPRESSION: 1. Multifactorial degradation, primarily involving the upper abdomen, as detailed above. 2.  No acute process in the abdomen or pelvis. 3. Cirrhosis and hepatomegaly. 4. Trace fluid within the pelvis. 5.  Aortic Atherosclerosis (ICD10-I70.0). Electronically Signed   By: Jeronimo Greaves M.D.   On: 07/28/2019 13:48   CT HEAD WO CONTRAST  Result Date: 07/28/2019 CLINICAL DATA:  Encephalopathy. EXAM: CT HEAD WITHOUT CONTRAST TECHNIQUE: Contiguous axial images were obtained from the base of the skull through the vertex without intravenous contrast. COMPARISON:  May 30, 2019 FINDINGS: Brain: No evidence of acute infarction, hemorrhage, hydrocephalus, extra-axial collection or mass lesion/mass effect. Vascular: No hyperdense vessel or unexpected calcification. Skull: Normal. Negative for fracture or focal lesion. Sinuses/Orbits: Mild sphenoid and scattered ethmoid sinus disease. Visualized portions the orbits are unremarkable. Other: None. IMPRESSION: 1. No evidence of acute intracranial pathology. 2. Mild sphenoid and scattered ethmoid sinus disease. Electronically Signed   By: Donzetta Kohut M.D.   On: 07/28/2019 13:41   DG Chest Port 1 View  Result Date: 07/31/2019 CLINICAL DATA:  Acute respiratory failure EXAM: PORTABLE CHEST 1 VIEW COMPARISON:  07/30/2019, 07/29/2019 FINDINGS: Interval removal of endotracheal and esophageal tubes. Significant interval worsening of bilateral airspace disease. Possible pleural effusions. Stable cardiomediastinal silhouette. No pneumothorax. IMPRESSION: 1. Removal of endotracheal and esophageal tubes. 2. Significant interval worsening of bilateral airspace disease with development of possible pleural effusions. Electronically Signed   By: Jasmine Pang M.D.   On: 07/31/2019 20:21   DG  Chest Port 1 View  Result Date: 07/30/2019 CLINICAL DATA:  Hypoxia EXAM: PORTABLE CHEST 1 VIEW COMPARISON:  July 29, 2019 FINDINGS: Endotracheal tube is seen 3 cm above the carina. NG tube is seen below the diaphragm. There is interval slight worsening in the hazy/patchy airspace opacity within the right lower lung. Again noted is patchy air left infrahilar opacity. No pleural effusion is seen. No acute osseous abnormality. IMPRESSION: ET tube and NG tube in satisfactory position. Multifocal airspace opacities, with interval worsening in the right lower lung. Electronically Signed   By: Jonna Clark M.D.   On: 07/30/2019 04:36   DG Chest Port 1 View  Result Date: 07/29/2019 CLINICAL DATA:  Intubated EXAM: PORTABLE CHEST 1 VIEW COMPARISON:  07/28/2019 FINDINGS: Endotracheal tube with the tip 3.4 cm above the carina. Nasogastric tube with the tip projecting over the stomach. Bilateral patchy interstitial and alveolar airspace opacities. No pleural effusion or pneumothorax. Stable cardiomediastinal silhouette. No acute osseous abnormality. IMPRESSION: 1. Endotracheal tube with the tip 3.4 cm above the carina. 2. Bilateral interstitial and patchy alveolar airspace opacities which may reflect mild pulmonary edema versus multilobar pneumonia. Electronically Signed   By: Elige Ko   On: 07/29/2019 10:48   DG Chest Port 1 View  Result Date: 07/28/2019 CLINICAL DATA:  Endotracheal tube EXAM: PORTABLE CHEST 1 VIEW COMPARISON:  07/28/2019 FINDINGS: The endotracheal tube  terminates approximately 4.2 cm above the carina. The enteric tube extends below the left hemidiaphragm. There are perihilar airspace opacities with prominent interstitial lung markings. No pneumothorax. No convincing pleural effusion. The heart size is stable from prior study. IMPRESSION: Satisfactory positioning of the endotracheal and enteric tubes. Developing perihilar airspace opacities which may represent pulmonary edema in the appropriate  clinical setting. Electronically Signed   By: Katherine Mantle M.D.   On: 07/28/2019 23:34   DG Chest Port 1 View  Result Date: 07/28/2019 CLINICAL DATA:  Acute respiratory failure, intubated EXAM: PORTABLE CHEST 1 VIEW COMPARISON:  07/27/2019 FINDINGS: Single frontal view of the chest demonstrates stable position of the endotracheal tube and enteric catheters. The cardiac silhouette is unchanged. Stable diffuse interstitial prominence without airspace disease, effusion, or pneumothorax. There is mild central vascular congestion. IMPRESSION: 1. Central vascular congestion without superimposed airspace disease. 2. Stable support devices. Electronically Signed   By: Sharlet Salina M.D.   On: 07/28/2019 03:20   DG Chest Port 1 View  Result Date: 07/27/2019 CLINICAL DATA:  Intubated, hepatic encephalopathy EXAM: PORTABLE CHEST 1 VIEW COMPARISON:  05/31/2019 FINDINGS: Single frontal view of the chest demonstrates endotracheal tube overlying tracheal air column tip just below thoracic inlet. Enteric catheter tip and side port project over gastric body. Cardiac silhouette is unremarkable. Diffuse interstitial prominence throughout the lungs consistent with history of tobacco abuse, stable. No airspace disease, effusion, or pneumothorax. IMPRESSION: 1. Chronic interstitial lung disease, no acute process. 2. No complication after intubation. Electronically Signed   By: Sharlet Salina M.D.   On: 07/27/2019 19:44   DG Abd Portable 1V  Result Date: 07/27/2019 CLINICAL DATA:  Enteric catheter placement, hepatic encephalopathy EXAM: PORTABLE ABDOMEN - 1 VIEW COMPARISON:  None. FINDINGS: Supine frontal view of the lower chest and upper abdomen demonstrates enteric catheter tip and side port projecting over gastric body. Bowel gas pattern is unremarkable. No bowel obstruction or ileus. No masses or abnormal calcifications. Lung bases are clear. IMPRESSION: 1. Enteric catheter overlying gastric body. Electronically  Signed   By: Sharlet Salina M.D.   On: 07/27/2019 19:43   DG Hand Complete Right  Result Date: 07/27/2019 CLINICAL DATA:  Right hand injury EXAM: RIGHT HAND - COMPLETE 3+ VIEW COMPARISON:  05/30/2019 FINDINGS: Frontal, oblique, and lateral views of the right hand are obtained. Prior healed distal right radial fracture with resulting dorsal angulation of the radiocarpal joint unchanged. No acute displaced fractures. Likely previous healed fracture distal aspect right fifth metacarpal. There is radiocarpal joint space narrowing. Remaining joint spaces are relatively well preserved. Soft tissues appear normal. IMPRESSION: 1. Sequela of chronic healed distal right radial fracture, with stable dorsal angulation and radiocarpal osteoarthritis. 2. No acute bony abnormality of the right hand. Electronically Signed   By: Sharlet Salina M.D.   On: 07/27/2019 19:46    Consults:    Subjective:    Overnight Issues: Requiring large amounts of sedation.  Precedex on board.  Very agitated overnight, there was a question of needing to be reintubated for management of agitation, wife requested intubation only if emergent.  Objective:  Vital signs for last 24 hours: Temp:  [97.5 F (36.4 C)-98.2 F (36.8 C)] 98.2 F (36.8 C) (03/02 1950) Pulse Rate:  [37-106] 77 (03/02 2000) Resp:  [14-41] 35 (03/02 2000) BP: (115-168)/(71-137) 126/71 (03/02 2000) SpO2:  [88 %-99 %] 96 % (03/02 2003) FiO2 (%):  [70 %-98 %] 70 % (03/02 2003) Weight:  [89.1 kg] 89.1 kg (03/02 0401)  Hemodynamic parameters for last 24 hours:    Intake/Output from previous day: 03/01 0701 - 03/02 0700 In: 3305.9 [I.V.:2826.7; IV Piggyback:479.2] Out: 1025 [Urine:1025]  Intake/Output this shift: Total I/O In: 463.9 [I.V.:319.2; IV Piggyback:144.8] Out: 1900 [Urine:1900]  Vent settings for last 24 hours: FiO2 (%):  [70 %-98 %] 70 %  Physical Exam:  GENERAL: Agitated when not heavily sedated.  Remains off vent requiring high flow  nasal cannula +100% nonrebreather. HEAD: Normocephalic, atraumatic.  EYES: Pupils equal, round, reactive to light.  No scleral icterus.  MOUTH: Oral mucosa moist, no thrush NECK: Supple. No thyromegaly. No nodules. No JVD.  Trachea midline. PULMONARY: Coarse breath sounds, otherwise no adventitious sounds. CARDIOVASCULAR: S1 and S2. Regular rate and rhythm.  No rubs, murmurs or gallops appreciated GASTROINTESTINAL: Soft, nondistended, normoactive bowel sounds. MUSCULOSKELETAL: No joint deformity, no clubbing, no edema.  NEUROLOGIC: Agitated.  Moves all 4 spontaneously, no overt focal deficit. SKIN: Intact,warm,dry.  No rashes. PSYCH: Agitated.  Assessment/Plan:  Acute Hypoxic Respiratory Failure Aspiration pneumonia Due to aspiration of bloody vomitus/epistaxis Status post emergent endotracheal intubation Extubated 3/01 Continue as needed bronchodilator therapy Continue Unasyn for aspiration pneumonia   Acute encephalopathy Alcohol withdrawal Element of hepatic encephalopathy Ammonia level has responded to interventions CIWA for alcohol withdrawal Phenobarbital for withdrawal/agitation Add Valium for CIWA   Acute upper GI bleed Continue Protonix 40 mg twice daily GI consultants recommended medical management only Monitor H&H, transfuse as needed for Hgb less than 7 Lactulose for elevated ammonia levels    Transaminitis Alcoholic hepatitis Supportive care   Moderate protein calorie malnutrition Swallow evaluation when mentation clears Electrolyte replacement as necessary  Alcoholism/substance abuse High-dose thiamine Multivitamin supplementation Precedex for withdrawal issues     LOS: 4 days   Additional comments: Multidisciplinary rounds were performed with ICU team, wife was updated at bedside.  Critical Care Total Time*: 40 Minutes  C. Danice Goltz, MD [place] [facility] 07/31/2019  *Care during the described time interval was provided by me  and/or other providers on the critical care team.  I have reviewed this patient's available data, including medical history, events of note, physical examination and test results as part of my evaluation.  **This note was dictated using voice recognition software/Dragon.  Despite best efforts to proofread, errors can occur which can change the meaning.  Any change was purely unintentional.

## 2019-08-01 LAB — RENAL FUNCTION PANEL
Albumin: 2.6 g/dL — ABNORMAL LOW (ref 3.5–5.0)
Anion gap: 8 (ref 5–15)
BUN: 6 mg/dL (ref 6–20)
CO2: 29 mmol/L (ref 22–32)
Calcium: 7.9 mg/dL — ABNORMAL LOW (ref 8.9–10.3)
Chloride: 101 mmol/L (ref 98–111)
Creatinine, Ser: 0.87 mg/dL (ref 0.61–1.24)
GFR calc Af Amer: >60 mL/min (ref >60)
GFR calc non Af Amer: >60 mL/min (ref >60)
Glucose, Bld: 123 mg/dL — ABNORMAL HIGH (ref 70–99)
Phosphorus: 2.9 mg/dL (ref 2.5–4.6)
Potassium: 3 mmol/L — ABNORMAL LOW (ref 3.5–5.1)
Sodium: 138 mmol/L (ref 135–145)

## 2019-08-01 LAB — CBC
HCT: 27.6 % — ABNORMAL LOW (ref 39.0–52.0)
Hemoglobin: 9.6 g/dL — ABNORMAL LOW (ref 13.0–17.0)
MCH: 32 pg (ref 26.0–34.0)
MCHC: 34.8 g/dL (ref 30.0–36.0)
MCV: 92 fL (ref 80.0–100.0)
Platelets: 180 10*3/uL (ref 150–400)
RBC: 3 MIL/uL — ABNORMAL LOW (ref 4.22–5.81)
RDW: 13.4 % (ref 11.5–15.5)
WBC: 10.8 10*3/uL — ABNORMAL HIGH (ref 4.0–10.5)
nRBC: 0 % (ref 0.0–0.2)

## 2019-08-01 LAB — GLUCOSE, CAPILLARY
Glucose-Capillary: 102 mg/dL — ABNORMAL HIGH (ref 70–99)
Glucose-Capillary: 120 mg/dL — ABNORMAL HIGH (ref 70–99)
Glucose-Capillary: 120 mg/dL — ABNORMAL HIGH (ref 70–99)
Glucose-Capillary: 121 mg/dL — ABNORMAL HIGH (ref 70–99)
Glucose-Capillary: 127 mg/dL — ABNORMAL HIGH (ref 70–99)

## 2019-08-01 LAB — HEMOGLOBIN AND HEMATOCRIT, BLOOD
HCT: 26.8 % — ABNORMAL LOW (ref 39.0–52.0)
HCT: 27.5 % — ABNORMAL LOW (ref 39.0–52.0)
HCT: 29 % — ABNORMAL LOW (ref 39.0–52.0)
Hemoglobin: 10.1 g/dL — ABNORMAL LOW (ref 13.0–17.0)
Hemoglobin: 9.3 g/dL — ABNORMAL LOW (ref 13.0–17.0)
Hemoglobin: 9.6 g/dL — ABNORMAL LOW (ref 13.0–17.0)

## 2019-08-01 LAB — MAGNESIUM: Magnesium: 1.5 mg/dL — ABNORMAL LOW (ref 1.7–2.4)

## 2019-08-01 LAB — CULTURE, BLOOD (ROUTINE X 2)
Culture: NO GROWTH
Special Requests: ADEQUATE

## 2019-08-01 MED ORDER — POTASSIUM CHLORIDE 2 MEQ/ML IV SOLN
INTRAVENOUS | Status: DC
  Filled 2019-08-01 (×4): qty 1000

## 2019-08-01 MED ORDER — MAGNESIUM SULFATE 2 GM/50ML IV SOLN
2.0000 g | Freq: Once | INTRAVENOUS | Status: AC
  Administered 2019-08-01: 2 g via INTRAVENOUS
  Filled 2019-08-01: qty 50

## 2019-08-01 MED ORDER — PHENOBARBITAL SODIUM 65 MG/ML IJ SOLN
30.0000 mg | Freq: Two times a day (BID) | INTRAMUSCULAR | Status: DC
  Administered 2019-08-01 – 2019-08-03 (×3): 30 mg via INTRAVENOUS
  Filled 2019-08-01 (×3): qty 1

## 2019-08-01 MED ORDER — POTASSIUM CHLORIDE 10 MEQ/100ML IV SOLN
10.0000 meq | INTRAVENOUS | Status: AC
  Administered 2019-08-01 (×3): 10 meq via INTRAVENOUS
  Filled 2019-08-01 (×3): qty 100

## 2019-08-01 MED ORDER — IPRATROPIUM-ALBUTEROL 0.5-2.5 (3) MG/3ML IN SOLN
3.0000 mL | Freq: Four times a day (QID) | RESPIRATORY_TRACT | Status: DC
  Administered 2019-08-01 – 2019-08-03 (×7): 3 mL via RESPIRATORY_TRACT
  Filled 2019-08-01 (×7): qty 3

## 2019-08-01 NOTE — TOC Initial Note (Signed)
Transition of Care (TOC) - Initial/Assessment Note    Patient Details  Name: Joel Tanner Sr. MRN: [id number] Date of Birth: 27-Mar-1971  Transition of Care [facility]) CM/SW Contact:    Liliana Cline, LCSW Phone Number: 08/01/2019, 4:10 PM  Clinical Narrative:          CSW called patient's wife Marvia Pickles) to complete Readmission Screening. Explained CSW role. Marvia Pickles reported she and patient live together in [place]. She stated prior to this hospitalization, patient was independent, worked a full Data processing manager job, and drove himself places. Marvia Pickles reported patient did not have any DME or home health services. She stated patient was involved in [place]. Marvia Pickles reported patient was previously provided with Substance Abuse resources and she still has this information to use if needed. She stated she feels "the best thing for him" is to remain involved with his job (as she said his coworkers are supportive) and for patient to be active in [place]. Marvia Pickles stated she attends AA Meetings with patient. She reported other family is not supportive of patient's recovery process and patient tends to start drinking again when Marvia Pickles is not around. Marvia Pickles reported [facility] [place] and current PCP is Dr. Quillian Quince. She reported patients insurance ended on 2/28  so now he is uninsured. She was interested in [facility] and [facility] since patient is now uninsured. CSW will provide information to Marvia Pickles and patient. Marvia Pickles would like patient to complete POA and Living Will paperwork. CSW submitted request to RN to put in consult with Chaplain to complete these things when patient is able to. CSW encouraged Marvia Pickles to reach out with any questions or needs. CSW will continue to follow.    Expected Discharge Plan: Home/Self Care Barriers to Discharge: Continued Medical Work up   Patient Goals and CMS Choice        Expected Discharge Plan and Services Expected Discharge Plan: Home/Self Care        Living arrangements for the past 2 months: Single Family Home                                      Prior Living Arrangements/Services Living arrangements for the past 2 months: Single Family Home Lives with:: Spouse Patient language and need for interpreter reviewed:: Yes Do you feel safe going back to the place where you live?: Yes      Need for Family Participation in Patient Care: Yes (Comment) Care giver support system in place?: Yes (comment)   Criminal Activity/Legal Involvement Pertinent to Current Situation/Hospitalization: No - Comment as needed  Activities of Daily Living Home Assistive Devices/Equipment: None ADL Screening (condition at time of admission) Patient's cognitive ability adequate to safely complete daily activities?: Yes Is the patient deaf or have difficulty hearing?: No Does the patient have difficulty seeing, even when wearing glasses/contacts?: No Does the patient have difficulty concentrating, remembering, or making decisions?: No Patient able to express need for assistance with ADLs?: Yes Does the patient have difficulty dressing or bathing?: No Independently performs ADLs?: Yes (appropriate for developmental age) Does the patient have difficulty walking or climbing stairs?: No Weakness of Legs: None Weakness of Arms/Hands: None  Permission Sought/Granted                  Emotional Assessment  Alcohol / Substance Use: Alcohol Use    Admission diagnosis:  Hepatic encephalopathy (HCC) [K72.90] Acute hepatic encephalopathy [K72.00] Altered mental status, unspecified altered mental status type [R41.82] Patient Active Problem List   Diagnosis Date Noted  . HLD (hyperlipidemia) 07/27/2019  . GERD (gastroesophageal reflux disease) 07/27/2019  . Alcohol abuse 07/27/2019  . Tobacco abuse 07/27/2019  . Alcoholic intoxication with complication ([place])   . Hypokalemia   . Seizures ([place]) 05/30/2019  . Hypothermia 05/13/2019  .  Substance abuse ([place]) 04/01/2019  . Coagulopathy ([place]) 04/01/2019  . Alcohol use disorder, moderate, dependence ([place]) 04/01/2019  . Irritability 04/01/2019  . Acute hepatic encephalopathy 07/22/2015   PCP:  Lynnell Jude, MD Pharmacy:   [place], [address] [place] [address] Phone: [phone] Fax: [phone]  [id number] [place], [place] [address] [address] Phone: [phone] Fax: [phone]     Social Determinants of Health (SDOH) Interventions    Readmission Risk Interventions Readmission Risk Prevention Plan 08/01/2019  Transportation Screening Complete  Medication Review Press photographer) Complete  PCP or Specialist appointment within 3-5 days of discharge Complete  HRI or Home Care Consult Complete  Some recent data might be hidden

## 2019-08-01 NOTE — Progress Notes (Signed)
CSW attempted call to patient's wife for Readmission Screen. Went straight to [employer]. CSW left a message requesting a return call.  [name], [place] [phone]

## 2019-08-01 NOTE — Progress Notes (Addendum)
Nutrition Follow Up Note   DOCUMENTATION CODES:   Not applicable  INTERVENTION:   RD will add supplements pending SLP evaluation  If patient is unable to initiate on a diet in the next 1-2 days; recommend NGT placement and nutrition support.   MVI daily   NUTRITION DIAGNOSIS:   Inadequate oral intake related to inability to eat as evidenced by NPO status.  GOAL:   Patient will meet greater than or equal to 90% of their needs  -previously met with tube feeds   MONITOR:   Diet advancement, Labs, Weight trends, I & O's, Skin  ASSESSMENT:   49 y.o. male with medical history significant of hepatitis C, tobacco abuse, alcohol abuse,cirrhosis, seizures, GERD, hyperlipidemia, hepatic encephalopathy requiring intubation in the past, who presents with altered mental status requiring sedation and intubation   Pt extubated 3/1. Pt remains NPO; plan is for SLP evaluation. RD will add supplements and MVI pending SLP recommendations. If patient is unable to initiate on a diet in the next 1-2 days; recommend NGT placement and nutrition support.   Medications reviewed and include: folic acid, nicotine, protonix, unasyn, precedex, LRS w/ 5% dextrose @75ml /hr  Labs reviewed: K 3.0(L), P 2.9 wnl, Mg 1.5(L) Wbc- 10.8(H), Hgb 9.3(L), Hct 26.8(L)  Diet Order:   Diet Order            Diet NPO time specified  Diet effective now             EDUCATION NEEDS:   No education needs have been identified at this time  Skin:  Skin Assessment: Reviewed RN Assessment  Last BM:  PTA/unknown  Height:   Ht Readings from Last 1 Encounters:  07/28/19 5' 7.99" (1.727 m)    Weight:   Wt Readings from Last 1 Encounters:  08/01/19 84.1 kg    Ideal Body Weight:  70 kg  BMI:  Body mass index is 28.2 kg/m.  Estimated Nutritional Needs:   Kcal:  2000-2300kcal/day  Protein:  100-115g/day  Fluid:  >2.1L/day  Koleen Distance MS, RD, LDN Contact information available in Amion

## 2019-08-01 NOTE — Progress Notes (Signed)
Pharmacy Electrolyte Monitoring Consult:  Pharmacy consulted to assist in monitoring and replacing electrolytes in this 49 y.o. male admitted on 07/27/2019 with alcohol withdrawal and a GI bleed. Patient is extubated and off all sedative infusions. Patient with past medical history significant for Hepatitis C, Alcohol abuse, hepatic encephalopathy.   Labs:  Sodium (mmol/L)  Date Value  08/01/2019 138  10/13/2012 133 (L)   Potassium (mmol/L)  Date Value  08/01/2019 3.0 (L)  10/13/2012 5.0   Magnesium (mg/dL)  Date Value  93/57/0177 1.5 (L)   Phosphorus (mg/dL)  Date Value  93/90/3009 2.9   Calcium (mg/dL)  Date Value  23/30/0762 7.9 (L)   Calcium, Total (mg/dL)  Date Value  26/33/3545 8.2 (L)   Albumin (g/dL)  Date Value  62/56/3893 2.6 (L)  10/13/2012 4.0   Corrected Ca: 8.9 mg/dL  Assessment/Plan: 1. Electrolytes: Goals of Therapy: K 4.0 - 5.1 mmol/L, Mg 2.0 - 2.4 mg/dL, others wnl  IVF transitioned to D5LR/Potassium at 29mL/hr. Will order potassium IV Q1hr x 3 doses. Patient received magnesium 2g IV x 1 this am. Will order additional magnesium 2g for total replacement of magnesium 4g IV.   2. Glucose: no history of diabetes noted. Will monitor via BMPs and initiate SSI as appropriate.  3. Constipation: patient being evaluated for possible GI bleed and is NPO. Will continue to monitor.   Pharmacy will continue to monitor and adjust per consult.   Powell Halbert L, PharmD 08/01/2019 3:11 PM

## 2019-08-01 NOTE — Progress Notes (Signed)
Follow up - Critical Care Medicine Note  Patient Details:    Joel SAVASTANO Sr. is an 49 y.o. malewith medical history significant ofhepatitis C, tobacco abuse, alcohol abuse,cirrhosis, seizures, GERD, hyperlipidemia, hepatic encephalopathy requiring intubation in the past, who presents with altered mental status. Per EDP, pt's mother reported thatpatient was at work today,but was altered, acting abnormally and appeared quite confused.  On arrival pt was encephalopathic with active epistaxis and hematemesis  Pt with elevated venous ammonia and etoh levels.  Patient had aspiration event.  Required intubation and mechanical ventilation  Lines, Airways, Drains: Urethral Catheter merline G Straight-tip 16 Fr. (Active)  Indication for Insertion or Continuance of Catheter Acute urinary retention (I&O Cath for 24 hrs prior to catheter insertion- Inpatient Only) 07/30/19 1928  Site Assessment Clean;Intact 07/30/19 1928  Catheter Maintenance Bag below level of bladder;Drainage bag/tubing not touching floor;No dependent loops 07/30/19 1928  Collection Container Standard drainage bag 07/30/19 1928  Securement Method Securing device (Describe) 07/30/19 1928  Urinary Catheter Interventions (if applicable) Unclamped 07/30/19 1928  Output (mL) 125 mL 07/30/19 1929    Anti-infectives:  Anti-infectives (From admission, onward)   Start     Dose/Rate Route Frequency Ordered Stop   07/30/19 2045  Ampicillin-Sulbactam (UNASYN) 3 g in sodium chloride 0.9 % 100 mL IVPB     3 g 200 mL/hr over 30 Minutes Intravenous Every 6 hours 07/30/19 1515     07/30/19 1200  Ampicillin-Sulbactam (UNASYN) 3 g in sodium chloride 0.9 % 100 mL IVPB  Status:  Discontinued     3 g 200 mL/hr over 30 Minutes Intravenous Every 12 hours 07/30/19 1049 07/30/19 1515   07/27/19 1945  cefTRIAXone (ROCEPHIN) 2 g in sodium chloride 0.9 % 100 mL IVPB  Status:  Discontinued     2 g 200 mL/hr over 30 Minutes Intravenous Every 24 hours  07/27/19 1931 07/30/19 1049   07/27/19 1930  cefTRIAXone (ROCEPHIN) 1 g in sodium chloride 0.9 % 100 mL IVPB  Status:  Discontinued     1 g 200 mL/hr over 30 Minutes Intravenous Every 24 hours 07/27/19 1903 07/27/19 1931      Microbiology: Results for orders placed or performed during the hospital encounter of 07/27/19  Respiratory Panel by RT PCR (Flu A&B, Covid) - Nasopharyngeal Swab     Status: None   Collection Time: 07/27/19  3:34 PM   Specimen: Nasopharyngeal Swab  Result Value Ref Range Status   SARS Coronavirus 2 by RT PCR NEGATIVE NEGATIVE Final    Comment: (NOTE) SARS-CoV-2 target nucleic acids are NOT DETECTED. The SARS-CoV-2 RNA is generally detectable in upper respiratoy specimens during the acute phase of infection. The lowest concentration of SARS-CoV-2 viral copies this assay can detect is 131 copies/mL. A negative result does not preclude SARS-Cov-2 infection and should not be used as the sole basis for treatment or other patient management decisions. A negative result may occur with  improper specimen collection/handling, submission of specimen other than nasopharyngeal swab, presence of viral mutation(s) within the areas targeted by this assay, and inadequate number of viral copies (<131 copies/mL). A negative result must be combined with clinical observations, patient history, and epidemiological information. The expected result is Negative. Fact Sheet for Patients:  https://www.moore.com/ Fact Sheet for Healthcare Providers:  https://www.young.biz/ This test is not yet ap proved or cleared by the [place] FDA and  has been authorized for detection and/or diagnosis of SARS-CoV-2 by FDA under an Emergency Use Authorization (EUA). This EUA will  remain  in effect (meaning this test can be used) for the duration of the COVID-19 declaration under Section 564(b)(1) of the Act, 21 U.S.C. section 360bbb-3(b)(1), unless the  authorization is terminated or revoked sooner.    Influenza A by PCR NEGATIVE NEGATIVE Final   Influenza B by PCR NEGATIVE NEGATIVE Final    Comment: (NOTE) The Xpert Xpress SARS-CoV-2/FLU/RSV assay is intended as an aid in  the diagnosis of influenza from Nasopharyngeal swab specimens and  should not be used as a sole basis for treatment. Nasal washings and  aspirates are unacceptable for Xpert Xpress SARS-CoV-2/FLU/RSV  testing. Fact Sheet for Patients: https://www.moore.com/ Fact Sheet for Healthcare Providers: https://www.young.biz/ This test is not yet approved or cleared by the [place] FDA and  has been authorized for detection and/or diagnosis of SARS-CoV-2 by  FDA under an Emergency Use Authorization (EUA). This EUA will remain  in effect (meaning this test can be used) for the duration of the  Covid-19 declaration under Section 564(b)(1) of the Act, 21  U.S.C. section 360bbb-3(b)(1), unless the authorization is  terminated or revoked. Performed at [facility], [address] [place], [place] [address]   MRSA PCR Screening     Status: None   Collection Time: 07/27/19  5:39 PM   Specimen: Nasopharyngeal Swab  Result Value Ref Range Status   MRSA by PCR NEGATIVE NEGATIVE Final    Comment:        The GeneXpert MRSA Assay (FDA approved for NASAL specimens only), is one component of a comprehensive MRSA colonization surveillance program. It is not intended to diagnose MRSA infection nor to guide or monitor treatment for MRSA infections. Performed at [facility], [address] [place], [place] [address]   Respiratory Panel by RT PCR (Flu A&B, Covid) - Nasopharyngeal Swab     Status: None   Collection Time: 07/27/19  5:39 PM   Specimen: Nasopharyngeal Swab  Result Value Ref Range Status   SARS Coronavirus 2 by RT PCR NEGATIVE NEGATIVE Final    Comment: (NOTE) SARS-CoV-2 target nucleic acids are NOT  DETECTED. The SARS-CoV-2 RNA is generally detectable in upper respiratoy specimens during the acute phase of infection. The lowest concentration of SARS-CoV-2 viral copies this assay can detect is 131 copies/mL. A negative result does not preclude SARS-Cov-2 infection and should not be used as the sole basis for treatment or other patient management decisions. A negative result may occur with  improper specimen collection/handling, submission of specimen other than nasopharyngeal swab, presence of viral mutation(s) within the areas targeted by this assay, and inadequate number of viral copies (<131 copies/mL). A negative result must be combined with clinical observations, patient history, and epidemiological information. The expected result is Negative. Fact Sheet for Patients:  https://www.moore.com/ Fact Sheet for Healthcare Providers:  https://www.young.biz/ This test is not yet ap proved or cleared by the [place] FDA and  has been authorized for detection and/or diagnosis of SARS-CoV-2 by FDA under an Emergency Use Authorization (EUA). This EUA will remain  in effect (meaning this test can be used) for the duration of the COVID-19 declaration under Section 564(b)(1) of the Act, 21 U.S.C. section 360bbb-3(b)(1), unless the authorization is terminated or revoked sooner.    Influenza A by PCR NEGATIVE NEGATIVE Final   Influenza B by PCR NEGATIVE NEGATIVE Final    Comment: (NOTE) The Xpert Xpress SARS-CoV-2/FLU/RSV assay is intended as an aid in  the diagnosis of influenza from Nasopharyngeal swab specimens and  should not be used as a sole basis for treatment. Nasal washings and  aspirates are unacceptable for Xpert Xpress SARS-CoV-2/FLU/RSV  testing. Fact Sheet for Patients: https://www.moore.com/ Fact Sheet for Healthcare Providers: https://www.young.biz/ This test is not yet approved or cleared  by the [place] FDA and  has been authorized for detection and/or diagnosis of SARS-CoV-2 by  FDA under an Emergency Use Authorization (EUA). This EUA will remain  in effect (meaning this test can be used) for the duration of the  Covid-19 declaration under Section 564(b)(1) of the Act, 21  U.S.C. section 360bbb-3(b)(1), unless the authorization is  terminated or revoked. Performed at [facility], [address] [place], [place] [address]   CULTURE, BLOOD (ROUTINE X 2) w Reflex to ID Panel     Status: None   Collection Time: 07/27/19  7:29 PM   Specimen: BLOOD  Result Value Ref Range Status   Specimen Description BLOOD BLOOD RIGHT HAND  Final   Special Requests   Final    BOTTLES DRAWN AEROBIC AND ANAEROBIC Blood Culture adequate volume   Culture   Final    NO GROWTH 5 DAYS Performed at [facility], [address] [place], [place] [address]    Report Status 08/01/2019 FINAL  Final  Culture, respiratory (non-expectorated)     Status: None   Collection Time: 07/27/19  7:55 PM   Specimen: Tracheal Aspirate; Respiratory  Result Value Ref Range Status   Specimen Description   Final    TRACHEAL ASPIRATE Performed at [facility], [address] [place], [place] [address]    Special Requests NONE  Final   Gram Stain   Final    FEW WBC PRESENT, PREDOMINANTLY PMN RARE SQUAMOUS EPITHELIAL CELLS PRESENT ABUNDANT GRAM POSITIVE COCCI MODERATE GRAM POSITIVE RODS    Culture   Final    Consistent with normal respiratory flora. Performed at [facility], [address], [place], [place] [address]    Report Status 07/29/2019 FINAL  Final  Culture, blood (Routine X 2) w Reflex to ID Panel     Status: None (Preliminary result)   Collection Time: 07/27/19 11:59 PM   Specimen: BLOOD  Result Value Ref Range Status   Specimen Description BLOOD LEFT HAND  Final   Special Requests   Final    BOTTLES DRAWN AEROBIC AND ANAEROBIC Blood Culture adequate  volume   Culture   Final    NO GROWTH 4 DAYS Performed at [facility], [address], [place], [place] [address]    Report Status PENDING  Incomplete    Best Practice/Protocols:  VTE Prophylaxis: Mechanical GI Prophylaxis: Proton Pump Inhibitor CIWA/ sedation protocols  Events: 2/28-patient is having bloody aspirate from endotracheal tube, will hold off on extubation today. 3/01-agitated but redirectable, tolerating spontaneous breathing trial, plan extubation 3/02-severe agitation overnight started IV phenobarbital, switch benzodiazepine to Valium (per wife works best for patient) 3/03-calmer overall, off of Precedex, phenobarbital adjusted   Studies: CT ABDOMEN PELVIS WO CONTRAST  Result Date: 07/28/2019 CLINICAL DATA:  Nausea and vomiting. Hepatitis C. Tobacco abuse. Alcohol abuse. Cirrhosis. Hepatic encephalopathy. Mental status changes. EXAM: CT ABDOMEN AND PELVIS WITHOUT CONTRAST TECHNIQUE: Multidetector CT imaging of the abdomen and pelvis was performed following the standard protocol without IV contrast. COMPARISON:  Plain films 07/27/2019. Ultrasound 02/12/2019. No prior CT. FINDINGS: Lower chest: Bibasilar atelectasis.  Normal heart size. Hepatobiliary: Liver not well evaluated secondary to lack of IV contrast and patient arm position. Hepatomegaly at  22 cm. There also extensive EKG and wire lead artifacts. Mild cirrhosis. Nonspecific gallbladder wall thickening is suspected. No calcified stone or pericholecystic edema. No biliary duct dilatation. Pancreas: Normal, without mass or ductal dilatation. Spleen: Normal in size, without focal abnormality. Adrenals/Urinary Tract: Normal adrenal glands. No renal calculi or hydronephrosis. Foley catheter within the urinary bladder. Stomach/Bowel: Tiny hiatal hernia. Nasogastric tube terminating at the gastric body. Rectal catheter. Normal colon, appendix, and terminal ileum. Normal small bowel. Vascular/Lymphatic: Aortic  atherosclerosis. No abdominopelvic adenopathy. Reproductive: Normal prostate. Other: Trace pelvic fluid.  No free intraperitoneal air. Musculoskeletal: No acute osseous abnormality. Degenerative disc disease at L4-5 and L5-S1. IMPRESSION: 1. Multifactorial degradation, primarily involving the upper abdomen, as detailed above. 2.  No acute process in the abdomen or pelvis. 3. Cirrhosis and hepatomegaly. 4. Trace fluid within the pelvis. 5.  Aortic Atherosclerosis (ICD10-I70.0). Electronically Signed   By: Jeronimo Greaves M.D.   On: 07/28/2019 13:48   CT HEAD WO CONTRAST  Result Date: 07/28/2019 CLINICAL DATA:  Encephalopathy. EXAM: CT HEAD WITHOUT CONTRAST TECHNIQUE: Contiguous axial images were obtained from the base of the skull through the vertex without intravenous contrast. COMPARISON:  May 30, 2019 FINDINGS: Brain: No evidence of acute infarction, hemorrhage, hydrocephalus, extra-axial collection or mass lesion/mass effect. Vascular: No hyperdense vessel or unexpected calcification. Skull: Normal. Negative for fracture or focal lesion. Sinuses/Orbits: Mild sphenoid and scattered ethmoid sinus disease. Visualized portions the orbits are unremarkable. Other: None. IMPRESSION: 1. No evidence of acute intracranial pathology. 2. Mild sphenoid and scattered ethmoid sinus disease. Electronically Signed   By: Donzetta Kohut M.D.   On: 07/28/2019 13:41   DG Chest Port 1 View  Result Date: 07/31/2019 CLINICAL DATA:  Acute respiratory failure EXAM: PORTABLE CHEST 1 VIEW COMPARISON:  07/30/2019, 07/29/2019 FINDINGS: Interval removal of endotracheal and esophageal tubes. Significant interval worsening of bilateral airspace disease. Possible pleural effusions. Stable cardiomediastinal silhouette. No pneumothorax. IMPRESSION: 1. Removal of endotracheal and esophageal tubes. 2. Significant interval worsening of bilateral airspace disease with development of possible pleural effusions. Electronically Signed   By: Jasmine Pang M.D.   On: 07/31/2019 20:21   DG Chest Port 1 View  Result Date: 07/30/2019 CLINICAL DATA:  Hypoxia EXAM: PORTABLE CHEST 1 VIEW COMPARISON:  July 29, 2019 FINDINGS: Endotracheal tube is seen 3 cm above the carina. NG tube is seen below the diaphragm. There is interval slight worsening in the hazy/patchy airspace opacity within the right lower lung. Again noted is patchy air left infrahilar opacity. No pleural effusion is seen. No acute osseous abnormality. IMPRESSION: ET tube and NG tube in satisfactory position. Multifocal airspace opacities, with interval worsening in the right lower lung. Electronically Signed   By: Jonna Clark M.D.   On: 07/30/2019 04:36   DG Chest Port 1 View  Result Date: 07/29/2019 CLINICAL DATA:  Intubated EXAM: PORTABLE CHEST 1 VIEW COMPARISON:  07/28/2019 FINDINGS: Endotracheal tube with the tip 3.4 cm above the carina. Nasogastric tube with the tip projecting over the stomach. Bilateral patchy interstitial and alveolar airspace opacities. No pleural effusion or pneumothorax. Stable cardiomediastinal silhouette. No acute osseous abnormality. IMPRESSION: 1. Endotracheal tube with the tip 3.4 cm above the carina. 2. Bilateral interstitial and patchy alveolar airspace opacities which may reflect mild pulmonary edema versus multilobar pneumonia. Electronically Signed   By: Elige Ko   On: 07/29/2019 10:48   DG Chest Port 1 View  Result Date: 07/28/2019 CLINICAL DATA:  Endotracheal tube EXAM: PORTABLE CHEST 1 VIEW COMPARISON:  07/28/2019 FINDINGS: The endotracheal tube terminates approximately 4.2 cm above the carina. The enteric tube extends below the left hemidiaphragm. There are perihilar airspace opacities with prominent interstitial lung markings. No pneumothorax. No convincing pleural effusion. The heart size is stable from prior study. IMPRESSION: Satisfactory positioning of the endotracheal and enteric tubes. Developing perihilar airspace opacities which may  represent pulmonary edema in the appropriate clinical setting. Electronically Signed   By: Constance Holster M.D.   On: 07/28/2019 23:34   DG Chest Port 1 View  Result Date: 07/28/2019 CLINICAL DATA:  Acute respiratory failure, intubated EXAM: PORTABLE CHEST 1 VIEW COMPARISON:  07/27/2019 FINDINGS: Single frontal view of the chest demonstrates stable position of the endotracheal tube and enteric catheters. The cardiac silhouette is unchanged. Stable diffuse interstitial prominence without airspace disease, effusion, or pneumothorax. There is mild central vascular congestion. IMPRESSION: 1. Central vascular congestion without superimposed airspace disease. 2. Stable support devices. Electronically Signed   By: Randa Ngo M.D.   On: 07/28/2019 03:20   DG Chest Port 1 View  Result Date: 07/27/2019 CLINICAL DATA:  Intubated, hepatic encephalopathy EXAM: PORTABLE CHEST 1 VIEW COMPARISON:  05/31/2019 FINDINGS: Single frontal view of the chest demonstrates endotracheal tube overlying tracheal air column tip just below thoracic inlet. Enteric catheter tip and side port project over gastric body. Cardiac silhouette is unremarkable. Diffuse interstitial prominence throughout the lungs consistent with history of tobacco abuse, stable. No airspace disease, effusion, or pneumothorax. IMPRESSION: 1. Chronic interstitial lung disease, no acute process. 2. No complication after intubation. Electronically Signed   By: Randa Ngo M.D.   On: 07/27/2019 19:44   DG Abd Portable 1V  Result Date: 07/27/2019 CLINICAL DATA:  Enteric catheter placement, hepatic encephalopathy EXAM: PORTABLE ABDOMEN - 1 VIEW COMPARISON:  None. FINDINGS: Supine frontal view of the lower chest and upper abdomen demonstrates enteric catheter tip and side port projecting over gastric body. Bowel gas pattern is unremarkable. No bowel obstruction or ileus. No masses or abnormal calcifications. Lung bases are clear. IMPRESSION: 1. Enteric  catheter overlying gastric body. Electronically Signed   By: Randa Ngo M.D.   On: 07/27/2019 19:43   DG Hand Complete Right  Result Date: 07/27/2019 CLINICAL DATA:  Right hand injury EXAM: RIGHT HAND - COMPLETE 3+ VIEW COMPARISON:  05/30/2019 FINDINGS: Frontal, oblique, and lateral views of the right hand are obtained. Prior healed distal right radial fracture with resulting dorsal angulation of the radiocarpal joint unchanged. No acute displaced fractures. Likely previous healed fracture distal aspect right fifth metacarpal. There is radiocarpal joint space narrowing. Remaining joint spaces are relatively well preserved. Soft tissues appear normal. IMPRESSION: 1. Sequela of chronic healed distal right radial fracture, with stable dorsal angulation and radiocarpal osteoarthritis. 2. No acute bony abnormality of the right hand. Electronically Signed   By: Randa Ngo M.D.   On: 07/27/2019 19:46    Consults:    Subjective:    Overnight Issues: Patient requirements down. Precedex discontinued.  Actually knows that he is at [facility], has been able to use his phone successfully.  Mild confusion but overall easily reoriented and calm.  Objective:  Vital signs for last 24 hours: Temp:  [98.7 F (37.1 C)-100.2 F (37.9 C)] 99.8 F (37.7 C) (03/03 2006) Pulse Rate:  [65-94] 94 (03/03 2000) Resp:  [22-48] 28 (03/03 2000) BP: (120-163)/(70-93) 140/80 (03/03 2100) SpO2:  [91 %-99 %] 94 % (03/03 2000) FiO2 (%):  [34 %-70 %] 34 % (03/03 1400) Weight:  [84.1 kg] 84.1  kg (03/03 0355)  Hemodynamic parameters for last 24 hours:    Intake/Output from previous day: 03/02 0701 - 03/03 0700 In: 2257.4 [I.V.:1421.9; IV Piggyback:835.5] Out: 7550 [Urine:7550]  Intake/Output this shift: Total I/O In: 742.4 [I.V.:448.9; IV Piggyback:293.6] Out: 350 [Urine:350]  Vent settings for last 24 hours: FiO2 (%):  [34 %-70 %] 34 %  Physical Exam:  GENERAL: Oxygen requirements decreased.  On simple nasal  cannula.  Awake oriented to name and place somewhat to situation.  Disoriented to time HEAD: Normocephalic, atraumatic.  EYES: Pupils equal, round, reactive to light.  No scleral icterus.  MOUTH: Oral mucosa moist, no thrush NECK: Supple. No thyromegaly. No nodules. No JVD.  Trachea midline. PULMONARY: Rhonchi at the bases otherwise no other adventitious sounds. CARDIOVASCULAR: S1 and S2. Regular rate and rhythm.  No rubs, murmurs or gallops appreciated. GASTROINTESTINAL: Soft, nondistended, normoactive bowel sounds. MUSCULOSKELETAL: No joint deformity, no clubbing, no edema.  NEUROLOGIC: Calm, awake and alert, no overt focal deficit. SKIN: Intact,warm,dry.  No rashes. PSYCH: Calm, somewhat befuddled but easily reoriented.  Assessment/Plan:  Acute Hypoxic Respiratory Failure Aspiration pneumonia Due to aspiration of bloody vomitus/epistaxis Status post emergent endotracheal intubation Extubated 3/01 Continue as needed bronchodilator therapy Continue Unasyn for aspiration pneumonia Wean off oxygen as tolerated   Acute encephalopathy Alcohol withdrawal Element of hepatic encephalopathy Ammonia level has responded to interventions CIWA for alcohol withdrawal Phenobarbital for withdrawal/agitation Substitute Valium for Ativan for CIWA Discontinue Precedex   Acute upper GI bleed Continue Protonix 40 mg twice daily GI consultants recommended medical management only H&H stable, discontinue every 6 H&H's Lactulose for elevated ammonia levels    Transaminitis Alcoholic hepatitis Supportive care   Moderate protein calorie malnutrition Swallow evaluation by speech path in the morning Electrolyte replacement as necessary  Alcoholism/substance abuse High-dose thiamine Multivitamin supplementation      LOS: 5 days   Additional comments: Multidisciplinary rounds were performed with ICU team, wife was updated at bedside.  Critical Care Total Time*: Level 3  follow-up  C. Danice Goltz, MD [facility] 08/01/2019  *Care during the described time interval was provided by me and/or other providers on the critical care team.  I have reviewed this patient's available data, including medical history, events of note, physical examination and test results as part of my evaluation.  **This note was dictated using voice recognition software/Dragon.  Despite best efforts to proofread, errors can occur which can change the meaning.  Any change was purely unintentional.

## 2019-08-01 NOTE — Progress Notes (Signed)
Patient continuing to progress in awareness. Able to tell RN "I may have a slight drinking problem." HFNC weaned throughout the day to [place] at 1.5L. Precedex off. No PRN meds required. Speech clear. More appropriate and interactive with staff.

## 2019-08-02 ENCOUNTER — Inpatient Hospital Stay: Payer: No Typology Code available for payment source

## 2019-08-02 LAB — BASIC METABOLIC PANEL
Anion gap: 11 (ref 5–15)
BUN: 7 mg/dL (ref 6–20)
CO2: 21 mmol/L — ABNORMAL LOW (ref 22–32)
Calcium: 8 mg/dL — ABNORMAL LOW (ref 8.9–10.3)
Chloride: 101 mmol/L (ref 98–111)
Creatinine, Ser: 0.86 mg/dL (ref 0.61–1.24)
GFR calc Af Amer: >60 mL/min (ref >60)
GFR calc non Af Amer: >60 mL/min (ref >60)
Glucose, Bld: 115 mg/dL — ABNORMAL HIGH (ref 70–99)
Potassium: 3.6 mmol/L (ref 3.5–5.1)
Sodium: 133 mmol/L — ABNORMAL LOW (ref 135–145)

## 2019-08-02 LAB — CULTURE, BLOOD (ROUTINE X 2)
Culture: NO GROWTH
Special Requests: ADEQUATE

## 2019-08-02 LAB — GLUCOSE, CAPILLARY
Glucose-Capillary: 114 mg/dL — ABNORMAL HIGH (ref 70–99)
Glucose-Capillary: 107 mg/dL — ABNORMAL HIGH (ref 70–99)
Glucose-Capillary: 108 mg/dL — ABNORMAL HIGH (ref 70–99)

## 2019-08-02 LAB — HEMOGLOBIN AND HEMATOCRIT, BLOOD
HCT: 25.2 % — ABNORMAL LOW (ref 39.0–52.0)
HCT: 26.9 % — ABNORMAL LOW (ref 39.0–52.0)
Hemoglobin: 8.8 g/dL — ABNORMAL LOW (ref 13.0–17.0)
Hemoglobin: 9.4 g/dL — ABNORMAL LOW (ref 13.0–17.0)

## 2019-08-02 LAB — MAGNESIUM: Magnesium: 1.9 mg/dL (ref 1.7–2.4)

## 2019-08-02 LAB — PHOSPHORUS: Phosphorus: 3.1 mg/dL (ref 2.5–4.6)

## 2019-08-02 MED ORDER — ACETAMINOPHEN 325 MG PO TABS
650.0000 mg | ORAL_TABLET | ORAL | Status: DC | PRN
  Administered 2019-08-02 – 2019-08-04 (×4): 650 mg via ORAL
  Administered 2019-08-04: 325 mg via ORAL
  Filled 2019-08-02 (×4): qty 2

## 2019-08-02 MED ORDER — POTASSIUM CHLORIDE 20 MEQ PO PACK
40.0000 meq | PACK | Freq: Once | ORAL | Status: AC
  Administered 2019-08-02: 40 meq via ORAL
  Filled 2019-08-02: qty 2

## 2019-08-02 MED ORDER — HALOPERIDOL LACTATE 5 MG/ML IJ SOLN
5.0000 mg | Freq: Once | INTRAMUSCULAR | Status: AC
  Administered 2019-08-02: 5 mg via INTRAVENOUS
  Filled 2019-08-02: qty 1

## 2019-08-02 MED ORDER — DIPHENHYDRAMINE HCL 50 MG/ML IJ SOLN: 25.0000 mg | Freq: Once | INTRAMUSCULAR | Status: DC

## 2019-08-02 MED ORDER — IBUPROFEN 400 MG PO TABS: 600.0000 mg | ORAL_TABLET | Freq: Once | ORAL | Status: DC

## 2019-08-02 MED ORDER — QUETIAPINE FUMARATE 25 MG PO TABS
50.0000 mg | ORAL_TABLET | Freq: Every day | ORAL | Status: DC
  Administered 2019-08-02 – 2019-08-04 (×3): 50 mg via ORAL
  Filled 2019-08-02 (×3): qty 2

## 2019-08-02 MED ORDER — MAGNESIUM SULFATE 2 GM/50ML IV SOLN
2.0000 g | Freq: Once | INTRAVENOUS | Status: AC
  Administered 2019-08-02: 2 g via INTRAVENOUS
  Filled 2019-08-02: qty 50

## 2019-08-02 MED ORDER — KCL IN DEXTROSE-NACL 20-5-0.9 MEQ/L-%-% IV SOLN
INTRAVENOUS | Status: DC
  Filled 2019-08-02 (×6): qty 1000

## 2019-08-02 NOTE — Progress Notes (Signed)
CH returned to discuss AD w/pt.  Pt.'s wife Ronnald Collum) @ bedside in chair; pt. and wife had read through document and asked some questions; CH explained difficulty of getting AD notarized/witnessed due to COVID; pt. and wife have discussed Living Will and will get documents signed upon pt.'s discharge.  CH completed [facility] Goals of Care worksheet, recording pt.'s responses to questions.  No further needs expressed at this time.       08/02/19 1830  Clinical Encounter Type  Visited With Patient and family together  Visit Type Follow-up;Social support;Psychological support;Critical Care  Advance Directives (For Healthcare)  Does Patient Have a Medical Advance Directive? No  Would patient like information on creating a medical advance directive? Yes (Inpatient - patient defers creating a medical advance directive at this time - Information given)

## 2019-08-02 NOTE — Progress Notes (Signed)
Pt had two BM today, foley DC and has already voided post foley DC three times. Using urinal now.

## 2019-08-02 NOTE — Progress Notes (Signed)
Called and spoke with pt's wife Jovian Lembcke to update her that her husband had increased agitation with hallucinations overnight requiring valium, haldol, and precedex.  He is currently maintaining his airway.  She is very appreciative of the update.    Harlon Ditty, AGACNP-BC [facility] Pager: [phone]

## 2019-08-02 NOTE — Progress Notes (Signed)
CH visited pt. per OR for AD from RN; RN reports pt.'s mood and behavior have stabilized today and he is now appropriate to discuss AD; pt. in bed sitting up w/NT sitter in rm.  Pt. shared he came to [facility] due to ETOH overdose; says he has heard he was combative and violent in the last few days, but does not remember this.  Pt. lives w/his wife; says he has been in [place] and has two sponsors in an effort to get sober.  Pt. interested in completing AD --> CH left copy of document for pt.'s perusal and will return later today to answer questions.        08/02/19 1555  Clinical Encounter Type  Visited With Patient;Health care provider  Visit Type Initial;Social support;Psychological support;Critical Care (Advanced Directive Education)  Referral From Nurse  Stress Factors  Patient Stress Factors Health changes;Loss of control

## 2019-08-02 NOTE — Progress Notes (Signed)
Pharmacy Electrolyte Monitoring Consult:  Pharmacy consulted to assist in monitoring and replacing electrolytes in this 49 y.o. male admitted on 07/27/2019 with alcohol withdrawal and a GI bleed. Patient is extubated and off all sedative infusions. Patient with past medical history significant for Hepatitis C, Alcohol abuse, hepatic encephalopathy.   Labs:  Sodium (mmol/L)  Date Value  08/02/2019 133 (L)  10/13/2012 133 (L)   Potassium (mmol/L)  Date Value  08/02/2019 3.6  10/13/2012 5.0   Magnesium (mg/dL)  Date Value  66/29/4765 1.9   Phosphorus (mg/dL)  Date Value  46/50/3546 3.1   Calcium (mg/dL)  Date Value  56/81/2751 8.0 (L)   Calcium, Total (mg/dL)  Date Value  70/05/7492 8.2 (L)   Albumin (g/dL)  Date Value  49/67/5916 2.6 (L)  10/13/2012 4.0   Corrected Ca: 9.1  mg/dL  Assessment/Plan:  1. Electrolytes: Goals of Therapy: K 4.0 - 5.1 mmol/L, Mg 2.0 - 2.4 mg/dL, others wnl  He remains hypokalemic with sodium trending down: change IV fluids to continue D5NS with 20 mEq/L KCl at 26mL/hr  Give an additional 40 mEq oral KCl  Will order additional magnesium sulfate 2g x 1  2. Glucose:   no history of diabetes noted   Monitor via BMPs and initiate SSI as appropriate.  3. Constipation:   LBM 3/4  Pharmacy will continue to monitor and adjust per consult.   Lowella Bandy, PharmD 08/02/2019 9:34 AM

## 2019-08-02 NOTE — Evaluation (Signed)
Clinical/Bedside Swallow Evaluation Patient Details  Name: Joel Tanner Sr. MRN: [id number] Date of Birth: 10-Jan-1971  Today's Date: 08/02/2019 Time: SLP Start Time (ACUTE ONLY): 0945 SLP Stop Time (ACUTE ONLY): 1030 SLP Time Calculation (min) (ACUTE ONLY): 45 min  Past Medical History:  Past Medical History:  Diagnosis Date  . Chronic alcohol abuse   . Elevated transaminase level   . Hepatitis C   . Seizure (HCC)   . Tobacco use    Past Surgical History:  Past Surgical History:  Procedure Laterality Date  . CARPAL TUNNEL RELEASE     HPI:  Pt is a 49 y.o. male with medical history significant of hepatitis C, ETOH use, tobacco abuse, alcohol abuse, seizure, GERD, hyperlipidemia, hepatic encephalopathy requiring intubation in the past, who presents with altered mental status. Per EDP, pt's mother reported that patient was at work today, but was altered, acting abnormally and appeared quite confused.  They brought the patient to the emergency department. When I saw pt in ED, pt is very confused, not following command and restless. He moves all extremities. No facial droop. Patient required placement of an artificial airway secondary to lethargy with severe epistaxis and hematemesis and aspiration of blood.   Assessment / Plan / Recommendation Clinical Impression  Pt appears to present w/ grossly adequate oropharyngeal phase swallowing function and bolus management during oral intake of trials given. Trials of solid foods were modified to broken down/moistened for easier mastication/attention to task d/t pt's intermittent Drowsiness. Pt presented w/ weakness; min drowsiness. He has required Medication for agitation during admission in CCU. Also noted ETOH use/abuse which can increase potential Reflux s/s -- pt exhibited Belching w/ po trials at evaluation. Pt appears at Reduced Risk for aspiration of po's when following general aspiration precautions. For this eval, pt required full positioning  support to sit upright for po trials. Pt then consumed trials of thin liquids VIA CUP, purees, and broken down solids w/ No overt clinical s/s of aspiration noted; O2 sats remained 98-99%. No coughing or wet vocal quality noted during/post trials. Oral phase grossly WFL for bolus mangement of consistencies given; adequate mastication effort/time w/ softened solids. Oral clearing achieved b/t trials w/ Time given. OM exam appeared [facility] w/ no unilateral weakness noted. Pt required feeding support d/t weakness, drowsiness(verbal cues realerted pt). Belching noted frequently during/post oral intake - concern for s/s of Reflux. Recommend initiation of an oral diet of Dysphagia level 2 (minced) w/ thin liquids; aspiration precautions; Pills in puree for safer swallowing - Crushed as needed. Feeding Support at all meals -- let pt Hold Cup to drink. Monitoring for toleration of po's. ST services will continue to f/u for toleration of diet and diet consistency(foods) upgrade as pt's medical status improves. Recommend Dietician f/u. Precautions posted. NSG updated.  SLP Visit Diagnosis: Dysphagia, unspecified (R13.10)    Aspiration Risk  Mild aspiration risk;Risk for inadequate nutrition/hydration(reduced following precautions)    Diet Recommendation  Dysphagia level 2 (minced foods w/ gravies to moisten); Thin liquids -- NO Straws. General aspiration and Reflux precautions. Assist at meals for feeding support - pt can Hold Cup to drink.   Medication Administration: Whole meds with puree(for safer swallowing)    Other  Recommendations Recommended Consults: (Dietitian f/u) Oral Care Recommendations: Oral care BID;Oral care before and after PO;Staff/trained caregiver to provide oral care Other Recommendations: (n/a)   Follow up Recommendations None(TBD)      Frequency and Duration min 2x/week  1 week  Prognosis Prognosis for Safe Diet Advancement: Good Barriers to Reach Goals: Time post onset;Severity  of deficits;Behavior      Swallow Study   General Date of Onset: 07/27/19 HPI: Pt is a 49 y.o. male with medical history significant of hepatitis C, ETOH use, tobacco abuse, alcohol abuse, seizure, GERD, hyperlipidemia, hepatic encephalopathy requiring intubation in the past, who presents with altered mental status. Per EDP, pt's mother reported that patient was at work today, but was altered, acting abnormally and appeared quite confused.  They brought the patient to the emergency department. When I saw pt in ED, pt is very confused, not following command and restless. He moves all extremities. No facial droop. Patient required placement of an artificial airway secondary to lethargy with severe epistaxis and hematemesis and aspiration of blood. Type of Study: Bedside Swallow Evaluation Previous Swallow Assessment: none Diet Prior to this Study: NPO(regular diet at home per pt) Temperature Spikes Noted: No(wbc 10.8) Respiratory Status: Nasal cannula(3L) History of Recent Intubation: Yes Length of Intubations (days): 3 days Date extubated: 07/30/19 Behavior/Cognition: Cooperative;Pleasant mood;Distractible;Requires cueing(Awake, drowsy) Oral Cavity Assessment: Within Functional Limits;Dry(min) Oral Care Completed by SLP: Yes Oral Cavity - Dentition: Adequate natural dentition Vision: Functional for self-feeding Self-Feeding Abilities: Able to feed self;Needs assist;Needs set up;Total assist(drowsy intermittently) Patient Positioning: Upright in bed(needed full positioning support) Baseline Vocal Quality: Normal Volitional Cough: Strong Volitional Swallow: Able to elicit    Oral/Motor/Sensory Function Overall Oral Motor/Sensory Function: Within functional limits   Ice Chips Ice chips: Within functional limits Presentation: Spoon(fed; 4 trials)   Thin Liquid Thin Liquid: Within functional limits Presentation: Cup;Self Fed(fully support by SLP; ~3-4 ozs total) Other Comments: water, coke     Nectar Thick Nectar Thick Liquid: Not tested   Honey Thick Honey Thick Liquid: Not tested   Puree Puree: Within functional limits Presentation: Spoon(fed; 10 trials)   Solid     Solid: Impaired Presentation: Spoon(fed; 8 trials) Oral Phase Impairments: (adequate) Oral Phase Functional Implications: (adequate) Pharyngeal Phase Impairments: (None) Other Comments: pt was drowsy during trials and required verbal/tactile cues to reattend to po tasks       Orinda Kenner, MS, CCC-SLP Kura Bethards 08/02/2019,10:46 AM

## 2019-08-02 NOTE — Progress Notes (Signed)
Late entering, pt getting more and more agitated , seeing things, getting out of bed, pulling on lines and tubes. Valium given without any relief. Precedex gtt restarted as ordered.

## 2019-08-02 NOTE — TOC Progression Note (Signed)
Transition of Care (TOC) - Progression Note    Patient Details  Name: [name] MRN: [id number] Date of Birth: June 04, 1970  Transition of Care [facility]) CM/SW Contact  Liliana Cline, LCSW Phone Number: 08/02/2019, 11:11 AM  Clinical Narrative:   CSW left [employer] application and free/low cost healthcare booklet in room for patient's wife as she works during the day and visits in the evenings. Called patient's wife, Marvia Pickles, to inform her this information was left in the room. She reported she will reach out to these resources closer to discharge. Encouraged Marvia Pickles to reach out with any questions or needs.     Expected Discharge Plan: Home/Self Care Barriers to Discharge: Continued Medical Work up  Expected Discharge Plan and Services Expected Discharge Plan: Home/Self Care       Living arrangements for the past 2 months: Single Family Home                                       Social Determinants of Health (SDOH) Interventions    Readmission Risk Interventions Readmission Risk Prevention Plan 08/01/2019  Transportation Screening Complete  Medication Review [employer]) Complete  PCP or Specialist appointment within 3-5 days of discharge Complete  HRI or Home Care Consult Complete  Some recent data might be hidden

## 2019-08-02 NOTE — Progress Notes (Signed)
Pt still agitated and restless, second dose of valium given with any relief. Seeing thing in the room, soft mitten applied to Bilat wrist, NP notified and 5mg  haldol given as ordered. precedex gtt increased to 43mcq/kg/hr. Pt has not slept all night. Oxygen increased to 3L/[place] and humidified.

## 2019-08-02 NOTE — Progress Notes (Signed)
Follow up - Critical Care Medicine Note  Patient Details:    Joel SAVASTANO Sr. is an 49 y.o. malewith medical history significant ofhepatitis C, tobacco abuse, alcohol abuse,cirrhosis, seizures, GERD, hyperlipidemia, hepatic encephalopathy requiring intubation in the past, who presents with altered mental status. Per EDP, pt's mother reported thatpatient was at work today,but was altered, acting abnormally and appeared quite confused.  On arrival pt was encephalopathic with active epistaxis and hematemesis  Pt with elevated venous ammonia and etoh levels.  Patient had aspiration event.  Required intubation and mechanical ventilation  Lines, Airways, Drains: Urethral Catheter merline G Straight-tip 16 Fr. (Active)  Indication for Insertion or Continuance of Catheter Acute urinary retention (I&O Cath for 24 hrs prior to catheter insertion- Inpatient Only) 07/30/19 1928  Site Assessment Clean;Intact 07/30/19 1928  Catheter Maintenance Bag below level of bladder;Drainage bag/tubing not touching floor;No dependent loops 07/30/19 1928  Collection Container Standard drainage bag 07/30/19 1928  Securement Method Securing device (Describe) 07/30/19 1928  Urinary Catheter Interventions (if applicable) Unclamped 07/30/19 1928  Output (mL) 125 mL 07/30/19 1929    Anti-infectives:  Anti-infectives (From admission, onward)   Start     Dose/Rate Route Frequency Ordered Stop   07/30/19 2045  Ampicillin-Sulbactam (UNASYN) 3 g in sodium chloride 0.9 % 100 mL IVPB     3 g 200 mL/hr over 30 Minutes Intravenous Every 6 hours 07/30/19 1515     07/30/19 1200  Ampicillin-Sulbactam (UNASYN) 3 g in sodium chloride 0.9 % 100 mL IVPB  Status:  Discontinued     3 g 200 mL/hr over 30 Minutes Intravenous Every 12 hours 07/30/19 1049 07/30/19 1515   07/27/19 1945  cefTRIAXone (ROCEPHIN) 2 g in sodium chloride 0.9 % 100 mL IVPB  Status:  Discontinued     2 g 200 mL/hr over 30 Minutes Intravenous Every 24 hours  07/27/19 1931 07/30/19 1049   07/27/19 1930  cefTRIAXone (ROCEPHIN) 1 g in sodium chloride 0.9 % 100 mL IVPB  Status:  Discontinued     1 g 200 mL/hr over 30 Minutes Intravenous Every 24 hours 07/27/19 1903 07/27/19 1931      Microbiology: Results for orders placed or performed during the hospital encounter of 07/27/19  Respiratory Panel by RT PCR (Flu A&B, Covid) - Nasopharyngeal Swab     Status: None   Collection Time: 07/27/19  3:34 PM   Specimen: Nasopharyngeal Swab  Result Value Ref Range Status   SARS Coronavirus 2 by RT PCR NEGATIVE NEGATIVE Final    Comment: (NOTE) SARS-CoV-2 target nucleic acids are NOT DETECTED. The SARS-CoV-2 RNA is generally detectable in upper respiratoy specimens during the acute phase of infection. The lowest concentration of SARS-CoV-2 viral copies this assay can detect is 131 copies/mL. A negative result does not preclude SARS-Cov-2 infection and should not be used as the sole basis for treatment or other patient management decisions. A negative result may occur with  improper specimen collection/handling, submission of specimen other than nasopharyngeal swab, presence of viral mutation(s) within the areas targeted by this assay, and inadequate number of viral copies (<131 copies/mL). A negative result must be combined with clinical observations, patient history, and epidemiological information. The expected result is Negative. Fact Sheet for Patients:  https://www.moore.com/ Fact Sheet for Healthcare Providers:  https://www.young.biz/ This test is not yet ap proved or cleared by the [place] FDA and  has been authorized for detection and/or diagnosis of SARS-CoV-2 by FDA under an Emergency Use Authorization (EUA). This EUA will  remain  in effect (meaning this test can be used) for the duration of the COVID-19 declaration under Section 564(b)(1) of the Act, 21 U.S.C. section 360bbb-3(b)(1), unless the  authorization is terminated or revoked sooner.    Influenza A by PCR NEGATIVE NEGATIVE Final   Influenza B by PCR NEGATIVE NEGATIVE Final    Comment: (NOTE) The Xpert Xpress SARS-CoV-2/FLU/RSV assay is intended as an aid in  the diagnosis of influenza from Nasopharyngeal swab specimens and  should not be used as a sole basis for treatment. Nasal washings and  aspirates are unacceptable for Xpert Xpress SARS-CoV-2/FLU/RSV  testing. Fact Sheet for Patients: https://www.moore.com/ Fact Sheet for Healthcare Providers: https://www.young.biz/ This test is not yet approved or cleared by the [place] FDA and  has been authorized for detection and/or diagnosis of SARS-CoV-2 by  FDA under an Emergency Use Authorization (EUA). This EUA will remain  in effect (meaning this test can be used) for the duration of the  Covid-19 declaration under Section 564(b)(1) of the Act, 21  U.S.C. section 360bbb-3(b)(1), unless the authorization is  terminated or revoked. Performed at [facility], [address] [place], [place] [address]   MRSA PCR Screening     Status: None   Collection Time: 07/27/19  5:39 PM   Specimen: Nasopharyngeal Swab  Result Value Ref Range Status   MRSA by PCR NEGATIVE NEGATIVE Final    Comment:        The GeneXpert MRSA Assay (FDA approved for NASAL specimens only), is one component of a comprehensive MRSA colonization surveillance program. It is not intended to diagnose MRSA infection nor to guide or monitor treatment for MRSA infections. Performed at [facility], [address] [place], [place] [address]   Respiratory Panel by RT PCR (Flu A&B, Covid) - Nasopharyngeal Swab     Status: None   Collection Time: 07/27/19  5:39 PM   Specimen: Nasopharyngeal Swab  Result Value Ref Range Status   SARS Coronavirus 2 by RT PCR NEGATIVE NEGATIVE Final    Comment: (NOTE) SARS-CoV-2 target nucleic acids are NOT  DETECTED. The SARS-CoV-2 RNA is generally detectable in upper respiratoy specimens during the acute phase of infection. The lowest concentration of SARS-CoV-2 viral copies this assay can detect is 131 copies/mL. A negative result does not preclude SARS-Cov-2 infection and should not be used as the sole basis for treatment or other patient management decisions. A negative result may occur with  improper specimen collection/handling, submission of specimen other than nasopharyngeal swab, presence of viral mutation(s) within the areas targeted by this assay, and inadequate number of viral copies (<131 copies/mL). A negative result must be combined with clinical observations, patient history, and epidemiological information. The expected result is Negative. Fact Sheet for Patients:  https://www.moore.com/ Fact Sheet for Healthcare Providers:  https://www.young.biz/ This test is not yet ap proved or cleared by the [place] FDA and  has been authorized for detection and/or diagnosis of SARS-CoV-2 by FDA under an Emergency Use Authorization (EUA). This EUA will remain  in effect (meaning this test can be used) for the duration of the COVID-19 declaration under Section 564(b)(1) of the Act, 21 U.S.C. section 360bbb-3(b)(1), unless the authorization is terminated or revoked sooner.    Influenza A by PCR NEGATIVE NEGATIVE Final   Influenza B by PCR NEGATIVE NEGATIVE Final    Comment: (NOTE) The Xpert Xpress SARS-CoV-2/FLU/RSV assay is intended as an aid in  the diagnosis of influenza from Nasopharyngeal swab specimens and  should not be used as a sole basis for treatment. Nasal washings and  aspirates are unacceptable for Xpert Xpress SARS-CoV-2/FLU/RSV  testing. Fact Sheet for Patients: PinkCheek.be Fact Sheet for Healthcare Providers: GravelBags.it This test is not yet approved or cleared  by the [place] FDA and  has been authorized for detection and/or diagnosis of SARS-CoV-2 by  FDA under an Emergency Use Authorization (EUA). This EUA will remain  in effect (meaning this test can be used) for the duration of the  Covid-19 declaration under Section 564(b)(1) of the Act, 21  U.S.C. section 360bbb-3(b)(1), unless the authorization is  terminated or revoked. Performed at [facility], [address] [place], [place] [address]   CULTURE, BLOOD (ROUTINE X 2) w Reflex to ID Panel     Status: None   Collection Time: 07/27/19  7:29 PM   Specimen: BLOOD  Result Value Ref Range Status   Specimen Description BLOOD BLOOD RIGHT HAND  Final   Special Requests   Final    BOTTLES DRAWN AEROBIC AND ANAEROBIC Blood Culture adequate volume   Culture   Final    NO GROWTH 5 DAYS Performed at [facility], [address], [place], [place] [address]    Report Status 08/01/2019 FINAL  Final  Culture, respiratory (non-expectorated)     Status: None   Collection Time: 07/27/19  7:55 PM   Specimen: Tracheal Aspirate; Respiratory  Result Value Ref Range Status   Specimen Description   Final    TRACHEAL ASPIRATE Performed at [facility], [address] [place], [place] [address]    Special Requests NONE  Final   Gram Stain   Final    FEW WBC PRESENT, PREDOMINANTLY PMN RARE SQUAMOUS EPITHELIAL CELLS PRESENT ABUNDANT GRAM POSITIVE COCCI MODERATE GRAM POSITIVE RODS    Culture   Final    Consistent with normal respiratory flora. Performed at [facility], [place] [address] [place], [place] [address]    Report Status 07/29/2019 FINAL  Final  Culture, blood (Routine X 2) w Reflex to ID Panel     Status: None   Collection Time: 07/27/19 11:59 PM   Specimen: BLOOD  Result Value Ref Range Status   Specimen Description BLOOD LEFT HAND  Final   Special Requests   Final    BOTTLES DRAWN AEROBIC AND ANAEROBIC Blood Culture adequate volume   Culture    Final    NO GROWTH 5 DAYS Performed at [facility], [address] [place], [place] [address]    Report Status 08/02/2019 FINAL  Final    Best Practice/Protocols:  VTE Prophylaxis: Mechanical GI Prophylaxis: Proton Pump Inhibitor CIWA/  Events: 2/28-patient is having bloody aspirate from endotracheal tube, will hold off on extubation today. 3/01-agitated but redirectable, tolerating spontaneous breathing trial, plan extubation 3/02-severe agitation overnight started IV phenobarbital, switch benzodiazepine to Valium (per wife works best for patient) 3/03-calmer overall, off of Precedex, phenobarbital adjusted 3/04-agitated again last night, required Precedex again, being weaned off   Studies: CT ABDOMEN PELVIS WO CONTRAST  Result Date: 07/28/2019 CLINICAL DATA:  Nausea and vomiting. Hepatitis C. Tobacco abuse. Alcohol abuse. Cirrhosis. Hepatic encephalopathy. Mental status changes. EXAM: CT ABDOMEN AND PELVIS WITHOUT CONTRAST TECHNIQUE: Multidetector CT imaging of the abdomen and pelvis was performed following the standard protocol without IV contrast. COMPARISON:  Plain films 07/27/2019. Ultrasound 02/12/2019. No prior CT. FINDINGS: Lower chest: Bibasilar atelectasis.  Normal heart size. Hepatobiliary: Liver not well evaluated secondary to lack of IV  contrast and patient arm position. Hepatomegaly at 22 cm. There also extensive EKG and wire lead artifacts. Mild cirrhosis. Nonspecific gallbladder wall thickening is suspected. No calcified stone or pericholecystic edema. No biliary duct dilatation. Pancreas: Normal, without mass or ductal dilatation. Spleen: Normal in size, without focal abnormality. Adrenals/Urinary Tract: Normal adrenal glands. No renal calculi or hydronephrosis. Foley catheter within the urinary bladder. Stomach/Bowel: Tiny hiatal hernia. Nasogastric tube terminating at the gastric body. Rectal catheter. Normal colon, appendix, and terminal ileum. Normal small  bowel. Vascular/Lymphatic: Aortic atherosclerosis. No abdominopelvic adenopathy. Reproductive: Normal prostate. Other: Trace pelvic fluid.  No free intraperitoneal air. Musculoskeletal: No acute osseous abnormality. Degenerative disc disease at L4-5 and L5-S1. IMPRESSION: 1. Multifactorial degradation, primarily involving the upper abdomen, as detailed above. 2.  No acute process in the abdomen or pelvis. 3. Cirrhosis and hepatomegaly. 4. Trace fluid within the pelvis. 5.  Aortic Atherosclerosis (ICD10-I70.0). Electronically Signed   By: Jeronimo Greaves M.D.   On: 07/28/2019 13:48   CT HEAD WO CONTRAST  Result Date: 07/28/2019 CLINICAL DATA:  Encephalopathy. EXAM: CT HEAD WITHOUT CONTRAST TECHNIQUE: Contiguous axial images were obtained from the base of the skull through the vertex without intravenous contrast. COMPARISON:  May 30, 2019 FINDINGS: Brain: No evidence of acute infarction, hemorrhage, hydrocephalus, extra-axial collection or mass lesion/mass effect. Vascular: No hyperdense vessel or unexpected calcification. Skull: Normal. Negative for fracture or focal lesion. Sinuses/Orbits: Mild sphenoid and scattered ethmoid sinus disease. Visualized portions the orbits are unremarkable. Other: None. IMPRESSION: 1. No evidence of acute intracranial pathology. 2. Mild sphenoid and scattered ethmoid sinus disease. Electronically Signed   By: Donzetta Kohut M.D.   On: 07/28/2019 13:41   DG Chest Port 1 View  Result Date: 07/31/2019 CLINICAL DATA:  Acute respiratory failure EXAM: PORTABLE CHEST 1 VIEW COMPARISON:  07/30/2019, 07/29/2019 FINDINGS: Interval removal of endotracheal and esophageal tubes. Significant interval worsening of bilateral airspace disease. Possible pleural effusions. Stable cardiomediastinal silhouette. No pneumothorax. IMPRESSION: 1. Removal of endotracheal and esophageal tubes. 2. Significant interval worsening of bilateral airspace disease with development of possible pleural effusions.  Electronically Signed   By: Jasmine Pang M.D.   On: 07/31/2019 20:21   DG Chest Port 1 View  Result Date: 07/30/2019 CLINICAL DATA:  Hypoxia EXAM: PORTABLE CHEST 1 VIEW COMPARISON:  July 29, 2019 FINDINGS: Endotracheal tube is seen 3 cm above the carina. NG tube is seen below the diaphragm. There is interval slight worsening in the hazy/patchy airspace opacity within the right lower lung. Again noted is patchy air left infrahilar opacity. No pleural effusion is seen. No acute osseous abnormality. IMPRESSION: ET tube and NG tube in satisfactory position. Multifocal airspace opacities, with interval worsening in the right lower lung. Electronically Signed   By: Jonna Clark M.D.   On: 07/30/2019 04:36   DG Chest Port 1 View  Result Date: 07/29/2019 CLINICAL DATA:  Intubated EXAM: PORTABLE CHEST 1 VIEW COMPARISON:  07/28/2019 FINDINGS: Endotracheal tube with the tip 3.4 cm above the carina. Nasogastric tube with the tip projecting over the stomach. Bilateral patchy interstitial and alveolar airspace opacities. No pleural effusion or pneumothorax. Stable cardiomediastinal silhouette. No acute osseous abnormality. IMPRESSION: 1. Endotracheal tube with the tip 3.4 cm above the carina. 2. Bilateral interstitial and patchy alveolar airspace opacities which may reflect mild pulmonary edema versus multilobar pneumonia. Electronically Signed   By: Elige Ko   On: 07/29/2019 10:48   DG Chest Port 1 View  Result Date: 07/28/2019 CLINICAL DATA:  Endotracheal  tube EXAM: PORTABLE CHEST 1 VIEW COMPARISON:  07/28/2019 FINDINGS: The endotracheal tube terminates approximately 4.2 cm above the carina. The enteric tube extends below the left hemidiaphragm. There are perihilar airspace opacities with prominent interstitial lung markings. No pneumothorax. No convincing pleural effusion. The heart size is stable from prior study. IMPRESSION: Satisfactory positioning of the endotracheal and enteric tubes. Developing  perihilar airspace opacities which may represent pulmonary edema in the appropriate clinical setting. Electronically Signed   By: Katherine Mantle M.D.   On: 07/28/2019 23:34   DG Chest Port 1 View  Result Date: 07/28/2019 CLINICAL DATA:  Acute respiratory failure, intubated EXAM: PORTABLE CHEST 1 VIEW COMPARISON:  07/27/2019 FINDINGS: Single frontal view of the chest demonstrates stable position of the endotracheal tube and enteric catheters. The cardiac silhouette is unchanged. Stable diffuse interstitial prominence without airspace disease, effusion, or pneumothorax. There is mild central vascular congestion. IMPRESSION: 1. Central vascular congestion without superimposed airspace disease. 2. Stable support devices. Electronically Signed   By: Sharlet Salina M.D.   On: 07/28/2019 03:20   DG Chest Port 1 View  Result Date: 07/27/2019 CLINICAL DATA:  Intubated, hepatic encephalopathy EXAM: PORTABLE CHEST 1 VIEW COMPARISON:  05/31/2019 FINDINGS: Single frontal view of the chest demonstrates endotracheal tube overlying tracheal air column tip just below thoracic inlet. Enteric catheter tip and side port project over gastric body. Cardiac silhouette is unremarkable. Diffuse interstitial prominence throughout the lungs consistent with history of tobacco abuse, stable. No airspace disease, effusion, or pneumothorax. IMPRESSION: 1. Chronic interstitial lung disease, no acute process. 2. No complication after intubation. Electronically Signed   By: Sharlet Salina M.D.   On: 07/27/2019 19:44   DG Abd Portable 1V  Result Date: 07/27/2019 CLINICAL DATA:  Enteric catheter placement, hepatic encephalopathy EXAM: PORTABLE ABDOMEN - 1 VIEW COMPARISON:  None. FINDINGS: Supine frontal view of the lower chest and upper abdomen demonstrates enteric catheter tip and side port projecting over gastric body. Bowel gas pattern is unremarkable. No bowel obstruction or ileus. No masses or abnormal calcifications. Lung bases  are clear. IMPRESSION: 1. Enteric catheter overlying gastric body. Electronically Signed   By: Sharlet Salina M.D.   On: 07/27/2019 19:43   DG Hand Complete Right  Result Date: 07/27/2019 CLINICAL DATA:  Right hand injury EXAM: RIGHT HAND - COMPLETE 3+ VIEW COMPARISON:  05/30/2019 FINDINGS: Frontal, oblique, and lateral views of the right hand are obtained. Prior healed distal right radial fracture with resulting dorsal angulation of the radiocarpal joint unchanged. No acute displaced fractures. Likely previous healed fracture distal aspect right fifth metacarpal. There is radiocarpal joint space narrowing. Remaining joint spaces are relatively well preserved. Soft tissues appear normal. IMPRESSION: 1. Sequela of chronic healed distal right radial fracture, with stable dorsal angulation and radiocarpal osteoarthritis. 2. No acute bony abnormality of the right hand. Electronically Signed   By: Sharlet Salina M.D.   On: 07/27/2019 19:46    Consults:    Subjective:    Overnight Issues: Agitated again last night, query sundowning.  Precedex transiently overnight, now being weaned off.  Back to being conversant.  Objective:  Vital signs for last 24 hours: Temp:  [98.5 F (36.9 C)-99.8 F (37.7 C)] 99.5 F (37.5 C) (03/04 1648) Pulse Rate:  [73-100] 99 (03/04 1700) Resp:  [18-59] 41 (03/04 1700) BP: (123-163)/(66-94) 149/79 (03/04 1700) SpO2:  [82 %-95 %] 94 % (03/04 1700) Weight:  [77.2 kg] 77.2 kg (03/04 0628)  Hemodynamic parameters for last 24 hours:  Intake/Output from previous day: 03/03 0701 - 03/04 0700 In: 3014.2 [I.V.:2275.6; IV Piggyback:738.6] Out: 1900 [Urine:1900]  Intake/Output this shift: Total I/O In: 1319.8 [P.O.:480; I.V.:789.8; IV Piggyback:50] Out: 1000 [Urine:1000]  Vent settings for last 24 hours:    Physical Exam:  GENERAL: Sedated earlier today now awake and conversant, still somewhat befuddled HEAD: Normocephalic, atraumatic.  EYES: Pupils equal,  round, reactive to light.  No scleral icterus.  MOUTH: Oral mucosa moist, no thrush NECK: Supple. No thyromegaly. No nodules. No JVD.  Trachea midline. PULMONARY: Rhonchi at the bases otherwise no other adventitious sounds. CARDIOVASCULAR: S1 and S2. Regular rate and rhythm.  No rubs, murmurs or gallops appreciated. GASTROINTESTINAL: Soft, nondistended, normoactive bowel sounds. MUSCULOSKELETAL: No joint deformity, no clubbing, no edema.  NEUROLOGIC: Calm, awake and alert, no overt focal deficit. SKIN: Intact,warm,dry.  No rashes. PSYCH: Calm, somewhat befuddled but easily reoriented.  Assessment/Plan:  Acute Hypoxic Respiratory Failure Aspiration pneumonia Due to aspiration of bloody vomitus/epistaxis Status post emergent endotracheal intubation Extubated 3/01 Continue as needed bronchodilator therapy Continue Unasyn for aspiration pneumonia Wean off oxygen as tolerated   Acute encephalopathy Alcohol withdrawal Query sundowning Element of hepatic encephalopathy Ammonia level has responded to interventions CIWA for alcohol withdrawal Phenobarbital for withdrawal/agitation, adjusted dose again today Substitute Valium for Ativan for CIWA Discontinue Precedex (started transiently again last night) Add Seroquel at evening time due to questionable sundowning   Acute upper GI bleed Continue Protonix 40 mg twice daily GI consultants recommended medical management only H&H stable, discontinued every 6 H&H's Lactulose for elevated ammonia levels    Transaminitis Alcoholic hepatitis Supportive care  Moderate protein calorie malnutrition Passed swallow evaluation by speech path this morning Electrolyte replacement as necessary Start diet per speech pathology recommendations  Alcoholism/substance abuse High-dose thiamine Multivitamin supplementation    LOS: 6 days   Additional comments: Multidisciplinary rounds were performed with ICU team.  Critical Care Total  Time*: Level 3 follow-up  C. Danice Goltz, MD [facility] 08/02/2019  *Care during the described time interval was provided by me and/or other providers on the critical care team.  I have reviewed this patient's available data, including medical history, events of note, physical examination and test results as part of my evaluation.  **This note was dictated using voice recognition software/Dragon.  Despite best efforts to proofread, errors can occur which can change the meaning.  Any change was purely unintentional.

## 2019-08-03 ENCOUNTER — Inpatient Hospital Stay: Payer: No Typology Code available for payment source

## 2019-08-03 LAB — CBC WITH DIFFERENTIAL/PLATELET
Abs Immature Granulocytes: 0.09 10*3/uL — ABNORMAL HIGH (ref 0.00–0.07)
Basophils Absolute: 0 10*3/uL (ref 0.0–0.1)
Basophils Relative: 0 %
Eosinophils Absolute: 0 10*3/uL (ref 0.0–0.5)
Eosinophils Relative: 0 %
HCT: 26.5 % — ABNORMAL LOW (ref 39.0–52.0)
Hemoglobin: 9.1 g/dL — ABNORMAL LOW (ref 13.0–17.0)
Immature Granulocytes: 1 %
Lymphocytes Relative: 13 %
Lymphs Abs: 1.4 10*3/uL (ref 0.7–4.0)
MCH: 31.6 pg (ref 26.0–34.0)
MCHC: 34.3 g/dL (ref 30.0–36.0)
MCV: 92 fL (ref 80.0–100.0)
Monocytes Absolute: 1 10*3/uL (ref 0.1–1.0)
Monocytes Relative: 10 %
Neutro Abs: 8 10*3/uL — ABNORMAL HIGH (ref 1.7–7.7)
Neutrophils Relative %: 76 %
Platelets: 200 10*3/uL (ref 150–400)
RBC: 2.88 MIL/uL — ABNORMAL LOW (ref 4.22–5.81)
RDW: 13.8 % (ref 11.5–15.5)
WBC: 10.5 10*3/uL (ref 4.0–10.5)
nRBC: 0 % (ref 0.0–0.2)

## 2019-08-03 LAB — CBC
HCT: 26.8 % — ABNORMAL LOW (ref 39.0–52.0)
Hemoglobin: 9 g/dL — ABNORMAL LOW (ref 13.0–17.0)
MCH: 31.4 pg (ref 26.0–34.0)
MCHC: 33.6 g/dL (ref 30.0–36.0)
MCV: 93.4 fL (ref 80.0–100.0)
Platelets: 219 10*3/uL (ref 150–400)
RBC: 2.87 MIL/uL — ABNORMAL LOW (ref 4.22–5.81)
RDW: 14.1 % (ref 11.5–15.5)
WBC: 11.4 10*3/uL — ABNORMAL HIGH (ref 4.0–10.5)
nRBC: 0 % (ref 0.0–0.2)

## 2019-08-03 LAB — RENAL FUNCTION PANEL
Albumin: 2.6 g/dL — ABNORMAL LOW (ref 3.5–5.0)
Anion gap: 10 (ref 5–15)
BUN: 8 mg/dL (ref 6–20)
CO2: 21 mmol/L — ABNORMAL LOW (ref 22–32)
Calcium: 8.1 mg/dL — ABNORMAL LOW (ref 8.9–10.3)
Chloride: 105 mmol/L (ref 98–111)
Creatinine, Ser: 0.88 mg/dL (ref 0.61–1.24)
GFR calc Af Amer: >60 mL/min (ref >60)
GFR calc non Af Amer: >60 mL/min (ref >60)
Glucose, Bld: 98 mg/dL (ref 70–99)
Phosphorus: 3.5 mg/dL (ref 2.5–4.6)
Potassium: 3.6 mmol/L (ref 3.5–5.1)
Sodium: 136 mmol/L (ref 135–145)

## 2019-08-03 LAB — URINALYSIS, COMPLETE (UACMP) WITH MICROSCOPIC
Bacteria, UA: NONE SEEN
Bilirubin Urine: NEGATIVE
Glucose, UA: NEGATIVE mg/dL
Hgb urine dipstick: NEGATIVE
Ketones, ur: NEGATIVE mg/dL
Leukocytes,Ua: NEGATIVE
Nitrite: NEGATIVE
Protein, ur: NEGATIVE mg/dL
Specific Gravity, Urine: 1.004 — ABNORMAL LOW (ref 1.005–1.030)
Squamous Epithelial / HPF: NONE SEEN (ref 0–5)
pH: 6 (ref 5.0–8.0)

## 2019-08-03 LAB — GLUCOSE, CAPILLARY
Glucose-Capillary: 97 mg/dL (ref 70–99)
Glucose-Capillary: 99 mg/dL (ref 70–99)
Glucose-Capillary: 79 mg/dL (ref 70–99)
Glucose-Capillary: 100 mg/dL — ABNORMAL HIGH (ref 70–99)
Glucose-Capillary: 100 mg/dL — ABNORMAL HIGH (ref 70–99)
Glucose-Capillary: 107 mg/dL — ABNORMAL HIGH (ref 70–99)

## 2019-08-03 LAB — PROCALCITONIN: Procalcitonin: 0.39 ng/mL

## 2019-08-03 LAB — MAGNESIUM: Magnesium: 2.2 mg/dL (ref 1.7–2.4)

## 2019-08-03 MED ORDER — PANTOPRAZOLE SODIUM 40 MG PO TBEC
40.0000 mg | DELAYED_RELEASE_TABLET | Freq: Two times a day (BID) | ORAL | Status: DC
  Administered 2019-08-03 – 2019-08-06 (×7): 40 mg via ORAL
  Filled 2019-08-03 (×7): qty 1

## 2019-08-03 MED ORDER — PHENOBARBITAL 32.4 MG PO TABS
32.4000 mg | ORAL_TABLET | Freq: Two times a day (BID) | ORAL | Status: DC
  Administered 2019-08-03 – 2019-08-05 (×4): 32.4 mg via ORAL
  Filled 2019-08-03 (×4): qty 1

## 2019-08-03 MED ORDER — IPRATROPIUM-ALBUTEROL 0.5-2.5 (3) MG/3ML IN SOLN
3.0000 mL | Freq: Two times a day (BID) | RESPIRATORY_TRACT | Status: DC
  Administered 2019-08-03 – 2019-08-06 (×5): 3 mL via RESPIRATORY_TRACT
  Filled 2019-08-03 (×5): qty 3

## 2019-08-03 MED ORDER — FOLIC ACID 1 MG PO TABS
1.0000 mg | ORAL_TABLET | Freq: Every day | ORAL | Status: DC
  Administered 2019-08-03 – 2019-08-06 (×4): 1 mg via ORAL
  Filled 2019-08-03 (×4): qty 1

## 2019-08-03 MED ORDER — POTASSIUM CHLORIDE 20 MEQ PO PACK
40.0000 meq | PACK | Freq: Two times a day (BID) | ORAL | Status: AC
  Administered 2019-08-03 (×2): 40 meq via ORAL
  Filled 2019-08-03 (×2): qty 2

## 2019-08-03 MED ORDER — ADULT MULTIVITAMIN W/MINERALS CH
1.0000 | ORAL_TABLET | Freq: Every day | ORAL | Status: DC
  Administered 2019-08-03 – 2019-08-06 (×4): 1 via ORAL
  Filled 2019-08-03 (×4): qty 1

## 2019-08-03 MED ORDER — ENSURE ENLIVE PO LIQD
237.0000 mL | Freq: Two times a day (BID) | ORAL | Status: DC
  Administered 2019-08-03 – 2019-08-06 (×6): 237 mL via ORAL

## 2019-08-03 NOTE — Progress Notes (Addendum)
   08/03/19 1300  Clinical Encounter Type  Visited With Patient  Visit Type Initial  Referral From Nurse  Consult/Referral To Chaplain  Chaplain visited with patient in response to an OR for AD. Patient said that he was aware that he needs witnesses and said he and his wife would have form notarized at bank. Patient requested a Bible. Chaplain brought him a Bible. Chaplain offered pastoral presence, empathy, and prayer.

## 2019-08-03 NOTE — Progress Notes (Signed)
CRITICAL CARE NOTE  49 y.o. malewith medical history significant ofhepatitis C, tobacco abuse, alcohol abuse,cirrhosis, seizures, GERD, hyperlipidemia, hepatic encephalopathy requiring intubation in the past, who presents with altered mental status. Per EDP, pt's mother reported thatpatient was at work today,but was altered, acting abnormally and appeared quite confused. On arrival pt was encephalopathic with active epistaxis and hematemesis Pt with elevated venous ammonia and etoh levels.  Patient had aspiration event.  Required intubation and mechanical ventilation  CC  follow up respiratory failure  SUBJECTIVE Alert and awake Follows commands Off precedex    BP 125/70 (BP Location: Right Arm)   Pulse 100   Temp 99.1 F (37.3 C) (Oral)   Resp (!) 34   Ht 5' 7.99" (1.727 m)   Wt 76.8 kg   SpO2 94%   BMI 25.75 kg/m    I/O last 3 completed shifts: In: 3256.5 [P.O.:480; I.V.:2238; IV Piggyback:538.5] Out: 3050 [Urine:3050] Total I/O In: 240 [P.O.:240] Out: 350 [Urine:350]  SpO2: 94 % O2 Flow Rate (L/min): 5 L/min FiO2 (%): 34 %   SIGNIFICANT EVENTS 2/28-patient is having bloody aspirate from endotracheal tube, will hold off on extubation today. 3/01-agitated but redirectable, tolerating spontaneous breathing trial, plan extubation 3/02-severe agitation overnight started IV phenobarbital, switch benzodiazepine to Valium (per wife works best for patient) 3/03-calmer overall, off of Precedex, phenobarbital adjusted 3/04-agitated again last night, required Precedex again, being weaned off    Review of Systems:  Gen:  Denies  fever, sweats, chills weight loss  HEENT: Denies blurred vision, double vision, ear pain, eye pain, hearing loss, nose bleeds, sore throat Cardiac:  No dizziness, chest pain or heaviness, chest tightness,edema, No JVD Resp:   No cough, -sputum production, -shortness of breath,-wheezing, -hemoptysis,  Other:  All other systems  negative   GENERAL:NAD HEAD: Normocephalic, atraumatic.  EYES: Pupils equal, round, reactive to light.  No scleral icterus.  MOUTH: Moist mucosal membrane. NECK: Supple.  PULMONARY: +rhonchi,  CARDIOVASCULAR: S1 and S2. Regular rate and rhythm. No murmurs, rubs, or gallops.  GASTROINTESTINAL: Soft, nontender, -distended.  Positive bowel sounds.   MUSCULOSKELETAL: No swelling, clubbing, or edema.  NEUROLOGIC: alert and awake, following commands SKIN:intact,warm,dry  MEDICATIONS: I have reviewed all medications and confirmed regimen as documented   CULTURE RESULTS   Recent Results (from the past 240 hour(s))  Respiratory Panel by RT PCR (Flu A&B, Covid) - Nasopharyngeal Swab     Status: None   Collection Time: 07/27/19  3:34 PM   Specimen: Nasopharyngeal Swab  Result Value Ref Range Status   SARS Coronavirus 2 by RT PCR NEGATIVE NEGATIVE Final    Comment: (NOTE) SARS-CoV-2 target nucleic acids are NOT DETECTED. The SARS-CoV-2 RNA is generally detectable in upper respiratoy specimens during the acute phase of infection. The lowest concentration of SARS-CoV-2 viral copies this assay can detect is 131 copies/mL. A negative result does not preclude SARS-Cov-2 infection and should not be used as the sole basis for treatment or other patient management decisions. A negative result may occur with  improper specimen collection/handling, submission of specimen other than nasopharyngeal swab, presence of viral mutation(s) within the areas targeted by this assay, and inadequate number of viral copies (<131 copies/mL). A negative result must be combined with clinical observations, patient history, and epidemiological information. The expected result is Negative. Fact Sheet for Patients:  https://www.moore.com/ Fact Sheet for Healthcare Providers:  https://www.young.biz/ This test is not yet ap proved or cleared by the [place] and  has  been authorized for  detection and/or diagnosis of SARS-CoV-2 by FDA under an Emergency Use Authorization (EUA). This EUA will remain  in effect (meaning this test can be used) for the duration of the COVID-19 declaration under Section 564(b)(1) of the Act, 21 U.S.C. section 360bbb-3(b)(1), unless the authorization is terminated or revoked sooner.    Influenza A by PCR NEGATIVE NEGATIVE Final   Influenza B by PCR NEGATIVE NEGATIVE Final    Comment: (NOTE) The Xpert Xpress SARS-CoV-2/FLU/RSV assay is intended as an aid in  the diagnosis of influenza from Nasopharyngeal swab specimens and  should not be used as a sole basis for treatment. Nasal washings and  aspirates are unacceptable for Xpert Xpress SARS-CoV-2/FLU/RSV  testing. Fact Sheet for Patients: https://www.moore.com/ Fact Sheet for Healthcare Providers: https://www.young.biz/ This test is not yet approved or cleared by the [place] FDA and  has been authorized for detection and/or diagnosis of SARS-CoV-2 by  FDA under an Emergency Use Authorization (EUA). This EUA will remain  in effect (meaning this test can be used) for the duration of the  Covid-19 declaration under Section 564(b)(1) of the Act, 21  U.S.C. section 360bbb-3(b)(1), unless the authorization is  terminated or revoked. Performed at [facility], [address] [place], [place] [address]   MRSA PCR Screening     Status: None   Collection Time: 07/27/19  5:39 PM   Specimen: Nasopharyngeal Swab  Result Value Ref Range Status   MRSA by PCR NEGATIVE NEGATIVE Final    Comment:        The GeneXpert MRSA Assay (FDA approved for NASAL specimens only), is one component of a comprehensive MRSA colonization surveillance program. It is not intended to diagnose MRSA infection nor to guide or monitor treatment for MRSA infections. Performed at [facility], [address] [place], [place] [address]    Respiratory Panel by RT PCR (Flu A&B, Covid) - Nasopharyngeal Swab     Status: None   Collection Time: 07/27/19  5:39 PM   Specimen: Nasopharyngeal Swab  Result Value Ref Range Status   SARS Coronavirus 2 by RT PCR NEGATIVE NEGATIVE Final    Comment: (NOTE) SARS-CoV-2 target nucleic acids are NOT DETECTED. The SARS-CoV-2 RNA is generally detectable in upper respiratoy specimens during the acute phase of infection. The lowest concentration of SARS-CoV-2 viral copies this assay can detect is 131 copies/mL. A negative result does not preclude SARS-Cov-2 infection and should not be used as the sole basis for treatment or other patient management decisions. A negative result may occur with  improper specimen collection/handling, submission of specimen other than nasopharyngeal swab, presence of viral mutation(s) within the areas targeted by this assay, and inadequate number of viral copies (<131 copies/mL). A negative result must be combined with clinical observations, patient history, and epidemiological information. The expected result is Negative. Fact Sheet for Patients:  https://www.moore.com/ Fact Sheet for Healthcare Providers:  https://www.young.biz/ This test is not yet ap proved or cleared by the [place] FDA and  has been authorized for detection and/or diagnosis of SARS-CoV-2 by FDA under an Emergency Use Authorization (EUA). This EUA will remain  in effect (meaning this test can be used) for the duration of the COVID-19 declaration under Section 564(b)(1) of the Act, 21 U.S.C. section 360bbb-3(b)(1), unless the authorization is terminated or revoked sooner.    Influenza A by PCR NEGATIVE NEGATIVE Final   Influenza B by PCR NEGATIVE NEGATIVE Final    Comment: (NOTE) The Xpert Xpress SARS-CoV-2/FLU/RSV assay is intended  as an aid in  the diagnosis of influenza from Nasopharyngeal swab specimens and  should not be used as a sole  basis for treatment. Nasal washings and  aspirates are unacceptable for Xpert Xpress SARS-CoV-2/FLU/RSV  testing. Fact Sheet for Patients: https://www.moore.com/ Fact Sheet for Healthcare Providers: https://www.young.biz/ This test is not yet approved or cleared by the [place] FDA and  has been authorized for detection and/or diagnosis of SARS-CoV-2 by  FDA under an Emergency Use Authorization (EUA). This EUA will remain  in effect (meaning this test can be used) for the duration of the  Covid-19 declaration under Section 564(b)(1) of the Act, 21  U.S.C. section 360bbb-3(b)(1), unless the authorization is  terminated or revoked. Performed at [facility], [address] [place], [place] [address]   CULTURE, BLOOD (ROUTINE X 2) w Reflex to ID Panel     Status: None   Collection Time: 07/27/19  7:29 PM   Specimen: BLOOD  Result Value Ref Range Status   Specimen Description BLOOD BLOOD RIGHT HAND  Final   Special Requests   Final    BOTTLES DRAWN AEROBIC AND ANAEROBIC Blood Culture adequate volume   Culture   Final    NO GROWTH 5 DAYS Performed at [facility], [address] [place], [place] [address]    Report Status 08/01/2019 FINAL  Final  Culture, respiratory (non-expectorated)     Status: None   Collection Time: 07/27/19  7:55 PM   Specimen: Tracheal Aspirate; Respiratory  Result Value Ref Range Status   Specimen Description   Final    TRACHEAL ASPIRATE Performed at [facility], [address] [place], [place] [address]    Special Requests NONE  Final   Gram Stain   Final    FEW WBC PRESENT, PREDOMINANTLY PMN RARE SQUAMOUS EPITHELIAL CELLS PRESENT ABUNDANT GRAM POSITIVE COCCI MODERATE GRAM POSITIVE RODS    Culture   Final    Consistent with normal respiratory flora. Performed at [facility], [address], [place], [place] [address]    Report Status 07/29/2019 FINAL  Final   Culture, blood (Routine X 2) w Reflex to ID Panel     Status: None   Collection Time: 07/27/19 11:59 PM   Specimen: BLOOD  Result Value Ref Range Status   Specimen Description BLOOD LEFT HAND  Final   Special Requests   Final    BOTTLES DRAWN AEROBIC AND ANAEROBIC Blood Culture adequate volume   Culture   Final    NO GROWTH 5 DAYS Performed at [facility], [address] [place], [place] [address]    Report Status 08/02/2019 FINAL  Final          IMAGING    DG Chest Port 1 View  Result Date: 08/02/2019 CLINICAL DATA:  Fevers EXAM: PORTABLE CHEST 1 VIEW COMPARISON:  07/31/2019 FINDINGS: Cardiac shadow is stable. Vascular congestion and patchy opacities are identified bilaterally but significantly improved when compared with the prior exam. No new infiltrate is seen. No bony abnormality is noted. IMPRESSION: Persistent but improved airspace disease bilaterally. Electronically Signed   By: Alcide Clever M.D.   On: 08/02/2019 23:57      ASSESSMENT AND PLAN SYNOPSIS   Severe ACUTE Hypoxic and Hypercapnic Respiratory Failure from acute aspiration pneumonia-due to aspiration of bloody vomitus extubated 3/1 resp failure resolved Oxygen as needed abx as prescribed   ALCOHOL WITHDRAWAL -Therapy with Thiamine and MVI -CIWA Protocol -Precedex as needed Phenobarbital for withdrawal/agitation  Substitute Valium for Ativan for CIWA Discontinue Precedex ( Seroquel at evening time due to questionable sundowning   NEUROLOGY More alert and awake today  ID -continue IV abx as prescibed -follow up cultures  GI GI PROPHYLAXIS as indicated  NUTRITIONAL STATUS DIET-->as tolerated Constipation protocol as indicated  ENDO - will use ICU hypoglycemic\Hyperglycemia protocol if indicated   ELECTROLYTES -follow labs as needed -replace as needed -pharmacy consultation and following   DVT/GI PRX ordered TRANSFUSIONS AS NEEDED MONITOR FSBS ASSESS the need for LABS  as needed  Recommend Transfer to [place], M.D.  [facility] Heckler [facility]  Medical Director [place] Director [address] Department

## 2019-08-03 NOTE — Progress Notes (Signed)
Pharmacy Electrolyte Monitoring Consult:  Pharmacy consulted to assist in monitoring and replacing electrolytes in this 49 y.o. male admitted on 07/27/2019 with alcohol withdrawal and a GI bleed. Patient is extubated and off all sedative infusions. Patient with past medical history significant for Hepatitis C, Alcohol abuse, hepatic encephalopathy.   Labs:  Sodium (mmol/L)  Date Value  08/03/2019 136  10/13/2012 133 (L)   Potassium (mmol/L)  Date Value  08/03/2019 3.6  10/13/2012 5.0   Magnesium (mg/dL)  Date Value  53/29/9242 2.2   Phosphorus (mg/dL)  Date Value  68/34/1962 3.5   Calcium (mg/dL)  Date Value  22/97/9892 8.1 (L)   Calcium, Total (mg/dL)  Date Value  11/94/1740 8.2 (L)   Albumin (g/dL)  Date Value  81/44/8185 2.6 (L)  10/13/2012 4.0   Corrected Ca: 9.1  mg/dL  Assessment/Plan:  1. Electrolytes: Goals of Therapy: K 4.0 - 5.1 mmol/L, Mg 2.0 - 2.4 mg/dL, others wnl  He remains hypokalemic with sodium now improved on revised fluids: continue D5NS with 20 mEq/L KCl at 35mL/hr  Give an additional 40 mEq oral KCl x 2  2. Glucose:   no history of diabetes noted   Monitor via BMPs and initiate SSI as appropriate.  3. Constipation:   LBM 3/4: no intervention required  Pharmacy will continue to monitor and adjust per consult.   Lowella Bandy, PharmD 08/03/2019 8:43 AM

## 2019-08-03 NOTE — Evaluation (Signed)
Physical Therapy Evaluation Patient Details Name: Joel BURCIAGA Sr. MRN: [id number] DOB: May 05, 1971 Today's Date: 08/03/2019   History of Present Illness  presented to ER secondary to AMS, epistaxis; admitted for management of acute hepatic encephalopathy. Hospital course significant for intubation (2/27-3/1) with periods of agitation.  Clinical Impression  Upon entry to room, patient just completed OOB/BSC with nursing; mildly SOB, reporting generalized weakness/unsteadiness with mobility tasks.  Bilat UE/LE strength and ROM grossly symmetrical and WFL with isolated testing; denies pain at this time.  Able to complete bed mobility with mod indep; sit/stand, basic transfers, gait (180') with RW, cga/min assist and gait (150') without assist device, min/mod assist.  Demonstrates reciprocal stepping pattern; cuing for postural extension and upward gaze.  Slightly impulsive with mobility at times, but easily redirectable.  Significant LOB during head turns without assist device, min/mod assist for correction.  Mild SOB with exertion, sats 87-88% briefly with gait, but recovers >90% within 30-40 seconds of seated rest Would benefit from skilled PT to address above deficits and promote optimal return to PLOF.; recommend transition to home with outpatient PT follow up as appropriate.    Follow Up Recommendations Outpatient PT    Equipment Recommendations  Rolling walker with 5" wheels;3in1 (PT)    Recommendations for Other Services       Precautions / Restrictions Precautions Precautions: Fall Restrictions Weight Bearing Restrictions: No      Mobility  Bed Mobility Overal bed mobility: Modified Independent                Transfers Overall transfer level: Needs assistance   Transfers: Sit to/from Stand Sit to Stand: Min guard;Min assist         General transfer comment: performed both with and without RW, fair/good LE strength, intermittent assist for  stabilization  Ambulation/Gait Ambulation/Gait assistance: Min guard;Min assist Gait Distance (Feet): 180 Feet Assistive device: Rolling walker (2 wheeled)       General Gait Details: reciprocal stepping pattern; cuing for postural extension and upward gaze.  Slightly impulsive with mobility at times, but easily redirectable.  Mild SOB with exertion, sats 87-88% briefly with gait, but recovers >90% within 30-40 seconds of seated rest  Stairs            Wheelchair Mobility    Modified Rankin (Stroke Patients Only)       Balance Overall balance assessment: Needs assistance Sitting-balance support: No upper extremity supported;Feet supported Sitting balance-Leahy Scale: Good     Standing balance support: No upper extremity supported Standing balance-Leahy Scale: Fair                               Pertinent Vitals/Pain Pain Assessment: No/denies pain    Home Living Family/patient expects to be discharged to:: Private residence Living Arrangements: Spouse/significant other Available Help at Discharge: Family Type of Home: House Home Access: Stairs to enter Entrance Stairs-Rails: Right;Left;Can reach both Technical brewer of Steps: 3 Home Layout: One level        Prior Function Level of Independence: Independent         Comments: Indep with ADLs, household and community mobilization without assist device; working as Human resources officer        Extremity/Trunk Assessment   Upper Extremity Assessment Upper Extremity Assessment: Overall WFL for tasks assessed    Lower Extremity Assessment Lower Extremity Assessment: Overall WFL for tasks assessed  Communication   Communication: No difficulties  Cognition Arousal/Alertness: Awake/alert Behavior During Therapy: WFL for tasks assessed/performed Overall Cognitive Status: Within Functional Limits for tasks assessed                                         General Comments      Exercises Other Exercises Other Exercises: 150' without assist device, cga/min assist-minimal change in step height/length, but mod deviation/LOB with head turns (requiring assist to recover balance) Other Exercises: 5x sit/stand without assist device, sup: 17 seconds.  Able to complete without use of UEs Other Exercises: Forward/backward/lateral stepping, heel/toe walking without assist device, cga/min assist for balance   Assessment/Plan    PT Assessment Patient needs continued PT services  PT Problem List Decreased activity tolerance;Decreased balance;Decreased mobility;Decreased coordination;Decreased cognition;Decreased knowledge of use of DME;Decreased safety awareness;Cardiopulmonary status limiting activity       PT Treatment Interventions DME instruction;Gait training;Functional mobility training;Therapeutic activities;Therapeutic exercise;Balance training;Cognitive remediation;Patient/family education    PT Goals (Current goals can be found in the Care Plan section)  Acute Rehab PT Goals Patient Stated Goal: to return home PT Goal Formulation: With patient Time For Goal Achievement: 08/17/19 Potential to Achieve Goals: Good    Frequency Min 2X/week   Barriers to discharge        Co-evaluation               AM-PAC PT "6 Clicks" Mobility  Outcome Measure Help needed turning from your back to your side while in a flat bed without using bedrails?: A Little Help needed moving from lying on your back to sitting on the side of a flat bed without using bedrails?: A Little Help needed moving to and from a bed to a chair (including a wheelchair)?: A Little Help needed standing up from a chair using your arms (e.g., wheelchair or bedside chair)?: A Little Help needed to walk in hospital room?: A Little Help needed climbing 3-5 steps with a railing? : A Little 6 Click Score: 18    End of Session Equipment Utilized During Treatment: Gait  belt Activity Tolerance: Patient tolerated treatment well Patient left: with call bell/phone within reach;in bed;with bed alarm set Nurse Communication: Mobility status PT Visit Diagnosis: Muscle weakness (generalized) (M62.81);Difficulty in walking, not elsewhere classified (R26.2)    Time: 1610-9604 PT Time Calculation (min) (ACUTE ONLY): 26 min   Charges:   PT Evaluation $PT Eval Moderate Complexity: 1 Mod PT Treatments $Gait Training: 8-22 mins        Saadia Dewitt H. Manson Passey, PT, DPT, NCS 08/03/19, 2:10 PM [phone]

## 2019-08-03 NOTE — Progress Notes (Signed)
Nutrition Follow-up  DOCUMENTATION CODES:   Not applicable  INTERVENTION:  Provide Ensure Enlive po BID, each supplement provides 350 kcal and 20 grams of protein. Patient prefers vanilla.  Continue folic acid 1 mg daily.  Also recommend MVI daily and thiamine 100 mg daily.  NUTRITION DIAGNOSIS:   Inadequate oral intake related to inability to eat as evidenced by NPO status.  Resolving - diet now advanced.  GOAL:   Patient will meet greater than or equal to 90% of their needs  Progressing.  MONITOR:   Diet advancement, Labs, Weight trends, I & O's, Skin  REASON FOR ASSESSMENT:   Ventilator    ASSESSMENT:   49 y.o. male with medical history significant of hepatitis C, tobacco abuse, alcohol abuse,cirrhosis, seizures, GERD, hyperlipidemia, hepatic encephalopathy requiring intubation in the past, who presents with altered mental status requiring sedation and intubation   -Patient was extubated on 3/1. -Diet was advanced to dysphagia 2 with thin liquids on 3/4.  Met with patient at bedside. He reports his appetite is improving but still decreased from normal. He is tolerating dysphagia 2 diet well. RN reports plan is for SLP to evaluate for upgraded diet today now that patient ism ore alert. Patient reports he is eating about 50% of his meals. Discussed importance of adequate nutrition. He is amenable to drinking vanilla ONS to help meet calorie/protein needs.  Medications reviewed and include: folic acid 1 mg daily, nicotine patch, pantoprazole, phenobarbital, potassium chloride 40 mEq BID, Seroquel, Unasyn, D5-NS with KCl 20 mEq/L at 75 mL/hr.  Labs reviewed: CBG 79-99, CO2 21. Potassium, Phosphorus, and Magnesium all WNL.  Diet Order:   Diet Order            DIET DYS 2 Room service appropriate? Yes with Assist; Fluid consistency: Thin  Diet effective now             EDUCATION NEEDS:   No education needs have been identified at this time  Skin:  Skin  Assessment: Reviewed RN Assessment  Last BM:  08/03/2019 medium type 7  Height:   Ht Readings from Last 1 Encounters:  07/28/19 5' 7.99" (1.727 m)   Weight:   Wt Readings from Last 1 Encounters:  08/03/19 76.8 kg   Ideal Body Weight:  70 kg  BMI:  Body mass index is 25.75 kg/m.  Estimated Nutritional Needs:   Kcal:  2000-2300kcal/day  Protein:  100-115g/day  Fluid:  >2.1L/day  Jacklynn Barnacle, MS, RD, LDN Pager number available on Amion

## 2019-08-04 ENCOUNTER — Inpatient Hospital Stay: Payer: No Typology Code available for payment source

## 2019-08-04 LAB — MAGNESIUM: Magnesium: 2 mg/dL (ref 1.7–2.4)

## 2019-08-04 LAB — GLUCOSE, CAPILLARY
Glucose-Capillary: 103 mg/dL — ABNORMAL HIGH (ref 70–99)
Glucose-Capillary: 88 mg/dL (ref 70–99)
Glucose-Capillary: 96 mg/dL (ref 70–99)
Glucose-Capillary: 98 mg/dL (ref 70–99)
Glucose-Capillary: 122 mg/dL — ABNORMAL HIGH (ref 70–99)

## 2019-08-04 LAB — RENAL FUNCTION PANEL
Albumin: 2.5 g/dL — ABNORMAL LOW (ref 3.5–5.0)
Anion gap: 13 (ref 5–15)
BUN: 11 mg/dL (ref 6–20)
CO2: 19 mmol/L — ABNORMAL LOW (ref 22–32)
Calcium: 8.2 mg/dL — ABNORMAL LOW (ref 8.9–10.3)
Chloride: 103 mmol/L (ref 98–111)
Creatinine, Ser: 0.76 mg/dL (ref 0.61–1.24)
GFR calc Af Amer: >60 mL/min (ref >60)
GFR calc non Af Amer: >60 mL/min (ref >60)
Glucose, Bld: 90 mg/dL (ref 70–99)
Phosphorus: 3.3 mg/dL (ref 2.5–4.6)
Potassium: 3.2 mmol/L — ABNORMAL LOW (ref 3.5–5.1)
Sodium: 135 mmol/L (ref 135–145)

## 2019-08-04 LAB — C DIFFICILE QUICK SCREEN W PCR REFLEX
C Diff antigen: POSITIVE — AB
C Diff toxin: NEGATIVE

## 2019-08-04 LAB — CBC
HCT: 26.2 % — ABNORMAL LOW (ref 39.0–52.0)
Hemoglobin: 8.9 g/dL — ABNORMAL LOW (ref 13.0–17.0)
MCH: 31.4 pg (ref 26.0–34.0)
MCHC: 34 g/dL (ref 30.0–36.0)
MCV: 92.6 fL (ref 80.0–100.0)
Platelets: 248 10*3/uL (ref 150–400)
RBC: 2.83 MIL/uL — ABNORMAL LOW (ref 4.22–5.81)
RDW: 14.5 % (ref 11.5–15.5)
WBC: 10.2 10*3/uL (ref 4.0–10.5)
nRBC: 0 % (ref 0.0–0.2)

## 2019-08-04 MED ORDER — POTASSIUM CHLORIDE 20 MEQ PO PACK
40.0000 meq | PACK | ORAL | Status: AC
  Administered 2019-08-04 (×3): 40 meq via ORAL
  Filled 2019-08-04 (×3): qty 2

## 2019-08-04 NOTE — Progress Notes (Signed)
Pt in no distress at this time. A&OX4. Walked around the nursing station x2 with walker and supervision. Tolerated well. SCDs on. VSS. PO potassium replacement. Awaiting floor bed.

## 2019-08-04 NOTE — Progress Notes (Signed)
Stool sample sent to lab

## 2019-08-04 NOTE — Progress Notes (Signed)
Pt has had multiple liquid bowel movements. MD made aware.

## 2019-08-04 NOTE — Progress Notes (Addendum)
0800 Wife called to check on patient. Insisted on another speech evaluation. Patients wife complained that patient did not like his Dysaphia 2 diet. Wife stated patient did not like his chopped food. Explained to wife that Speech Therapist recommended diets and food consistency based on their evaluation of the patients ability to swallow. Aspiration prevention for the patient was more important than patient preferences.

## 2019-08-04 NOTE — Progress Notes (Signed)
Temp 100.1, RN asked  Dr. Marylu Lund if it is okay to give prn tylenol with cirrhosis history, per Dr. Marylu Lund okay to give 325 mg. Pt continues to have liquid bowel movements with fever, MD made aware. New order for CXR and blood cultures. Per ID Dr. Drue Second, she will place C-diff test orders and enteric isolation order.

## 2019-08-04 NOTE — Progress Notes (Addendum)
PROGRESS NOTE    Joel GURKIN Sr.  UEK:[id number] DOB: 1970/12/27 DOA: 07/27/2019 PCP: Dortha Kern, MD    Brief Narrative:  abuse, alcohol abuse,cirrhosis, seizures, GERD, hyperlipidemia, hepatic encephalopathy requiring intubation in the past, who presents with altered mental status. Per EDP, pt's mother reported thatpatient was at work today,but was altered, acting abnormally and appeared quite confused. On arrival pt was encephalopathic with active epistaxis and hematemesis Pt with elevated venous ammonia and etoh levels.  Patient had aspiration event.  Required intubation and mechanical ventilation 2/28-patient is having bloody aspirate from endotracheal tube, will hold off on extubation today. 3/01-agitated but redirectable, tolerating spontaneous breathing trial, plan extubation 3/02-severe agitation overnight started IV phenobarbital, switch benzodiazepine to Valium (per wife works best for patient) 3/03-calmer overall, off of Precedex, phenobarbital adjusted 3/04-agitated again last night, required Precedex again, being weaned off   Consultants:   pccm  Procedures: Mechanical ventilation  Antimicrobials:   Unasyn   Subjective: Patient without any complaints.  No chest pain, , chills, abdominal pain or shortness of breath. Febrile  Objective: Vitals:   08/04/19 1000 08/04/19 1100 08/04/19 1132 08/04/19 1600  BP: 120/65 123/73  124/86  Pulse: 99 99  98  Resp: (!) 31 (!) 28  19  Temp:   99.1 F (37.3 C) 100.1 F (37.8 C)  TempSrc:   Oral Oral  SpO2: 93% 96%  95%  Weight:      Height:        Intake/Output Summary (Last 24 hours) at 08/04/2019 1630 Last data filed at 08/04/2019 1321 Gross per 24 hour  Intake 88.13 ml  Output 50 ml  Net 38.13 ml   Filed Weights   08/01/19 0355 08/02/19 0628 08/03/19 0316  Weight: 84.1 kg 77.2 kg 76.8 kg    Examination:  General exam: Appears calm and comfortable, NAD, no asterixis Respiratory system: No crackles,  mild rhonchi, no wheezing Cardiovascular system: S1 & S2 heard, RRR. No JVD, murmurs, rubs, gallops or clicks. Gastrointestinal system: Abdomen is nondistended, soft and nontender.. Normal bowel sounds heard. Central nervous system: Alert and oriented. No focal neurological deficits. Extremities: No edema Skin: Warm dry Psychiatry: Judgement and insight appear normal. Mood & affect appropriate.     Data Reviewed: I have personally reviewed following labs and imaging studies  CBC: Recent Labs  Lab 07/31/19 0012 07/31/19 0751 08/01/19 0540 08/01/19 1214 08/01/19 2348 08/02/19 0639 08/02/19 2345 08/03/19 0505 08/04/19 0619  WBC 8.9  --  10.8*  --   --   --  10.5 11.4* 10.2  NEUTROABS 7.0  --   --   --   --   --  8.0*  --   --   HGB 9.4*   < > 9.6*   < > 9.4* 8.8* 9.1* 9.0* 8.9*  HCT 27.8*   < > 27.6*   < > 26.9* 25.2* 26.5* 26.8* 26.2*  MCV 94.2  --  92.0  --   --   --  92.0 93.4 92.6  PLT 129*  --  180  --   --   --  200 219 248   < > = values in this interval not displayed.   Basic Metabolic Panel: Recent Labs  Lab 07/31/19 0012 08/01/19 0540 08/02/19 0639 08/03/19 0505 08/04/19 0619  NA 141 138 133* 136 135  K 3.6 3.0* 3.6 3.6 3.2*  CL 112* 101 101 105 103  CO2 21* 29 21* 21* 19*  GLUCOSE 126* 123* 115* 98 90  BUN 11 6 7 8 11   CREATININE 0.82 0.87 0.86 0.88 0.76  CALCIUM 7.7* 7.9* 8.0* 8.1* 8.2*  MG 2.0 1.5* 1.9 2.2 2.0  PHOS 2.4* 2.9 3.1 3.5 3.3   GFR: Estimated Creatinine Clearance: 106.9 mL/min (by C-G formula based on SCr of 0.76 mg/dL). Liver Function Tests: Recent Labs  Lab 07/29/19 0501 07/30/19 0544 08/01/19 0540 08/03/19 0505 08/04/19 0619  AST 36 29  --   --   --   ALT 30 24  --   --   --   ALKPHOS 65 67  --   --   --   BILITOT 0.7 0.6  --   --   --   PROT 5.8* 5.8*  --   --   --   ALBUMIN 3.0* 2.7* 2.6* 2.6* 2.5*   No results for input(s): LIPASE, AMYLASE in the last 168 hours. Recent Labs  Lab 07/29/19 0501 07/30/19 0544  AMMONIA  54* 48*   Coagulation Profile: No results for input(s): INR, PROTIME in the last 168 hours. Cardiac Enzymes: No results for input(s): CKTOTAL, CKMB, CKMBINDEX, TROPONINI in the last 168 hours. BNP (last 3 results) No results for input(s): PROBNP in the last 8760 hours. HbA1C: No results for input(s): HGBA1C in the last 72 hours. CBG: Recent Labs  Lab 08/03/19 2324 08/04/19 0351 08/04/19 0812 08/04/19 1138 08/04/19 1610  GLUCAP 107* 103* 88 96 98   Lipid Profile: No results for input(s): CHOL, HDL, LDLCALC, TRIG, CHOLHDL, LDLDIRECT in the last 72 hours. Thyroid Function Tests: No results for input(s): TSH, T4TOTAL, FREET4, T3FREE, THYROIDAB in the last 72 hours. Anemia Panel: No results for input(s): VITAMINB12, FOLATE, FERRITIN, TIBC, IRON, RETICCTPCT in the last 72 hours. Sepsis Labs: Recent Labs  Lab 07/29/19 0501 08/02/19 2345  PROCALCITON 0.42 0.39    Recent Results (from the past 240 hour(s))  Respiratory Panel by RT PCR (Flu A&B, Covid) - Nasopharyngeal Swab     Status: None   Collection Time: 07/27/19  3:34 PM   Specimen: Nasopharyngeal Swab  Result Value Ref Range Status   SARS Coronavirus 2 by RT PCR NEGATIVE NEGATIVE Final    Comment: (NOTE) SARS-CoV-2 target nucleic acids are NOT DETECTED. The SARS-CoV-2 RNA is generally detectable in upper respiratoy specimens during the acute phase of infection. The lowest concentration of SARS-CoV-2 viral copies this assay can detect is 131 copies/mL. A negative result does not preclude SARS-Cov-2 infection and should not be used as the sole basis for treatment or other patient management decisions. A negative result may occur with  improper specimen collection/handling, submission of specimen other than nasopharyngeal swab, presence of viral mutation(s) within the areas targeted by this assay, and inadequate number of viral copies (<131 copies/mL). A negative result must be combined with clinical observations,  patient history, and epidemiological information. The expected result is Negative. Fact Sheet for Patients:  PinkCheek.be Fact Sheet for Healthcare Providers:  GravelBags.it This test is not yet ap proved or cleared by the [place] FDA and  has been authorized for detection and/or diagnosis of SARS-CoV-2 by FDA under an Emergency Use Authorization (EUA). This EUA will remain  in effect (meaning this test can be used) for the duration of the COVID-19 declaration under Section 564(b)(1) of the Act, 21 U.S.C. section 360bbb-3(b)(1), unless the authorization is terminated or revoked sooner.    Influenza A by PCR NEGATIVE NEGATIVE Final   Influenza B by PCR NEGATIVE NEGATIVE Final    Comment: (NOTE) The Xpert  Xpress SARS-CoV-2/FLU/RSV assay is intended as an aid in  the diagnosis of influenza from Nasopharyngeal swab specimens and  should not be used as a sole basis for treatment. Nasal washings and  aspirates are unacceptable for Xpert Xpress SARS-CoV-2/FLU/RSV  testing. Fact Sheet for Patients: https://www.moore.com/ Fact Sheet for Healthcare Providers: https://www.young.biz/ This test is not yet approved or cleared by the [place] FDA and  has been authorized for detection and/or diagnosis of SARS-CoV-2 by  FDA under an Emergency Use Authorization (EUA). This EUA will remain  in effect (meaning this test can be used) for the duration of the  Covid-19 declaration under Section 564(b)(1) of the Act, 21  U.S.C. section 360bbb-3(b)(1), unless the authorization is  terminated or revoked. Performed at [facility], [address] [place], [place] [address]   MRSA PCR Screening     Status: None   Collection Time: 07/27/19  5:39 PM   Specimen: Nasopharyngeal Swab  Result Value Ref Range Status   MRSA by PCR NEGATIVE NEGATIVE Final    Comment:        The GeneXpert MRSA  Assay (FDA approved for NASAL specimens only), is one component of a comprehensive MRSA colonization surveillance program. It is not intended to diagnose MRSA infection nor to guide or monitor treatment for MRSA infections. Performed at [facility], [address] [place], [place] [address]   Respiratory Panel by RT PCR (Flu A&B, Covid) - Nasopharyngeal Swab     Status: None   Collection Time: 07/27/19  5:39 PM   Specimen: Nasopharyngeal Swab  Result Value Ref Range Status   SARS Coronavirus 2 by RT PCR NEGATIVE NEGATIVE Final    Comment: (NOTE) SARS-CoV-2 target nucleic acids are NOT DETECTED. The SARS-CoV-2 RNA is generally detectable in upper respiratoy specimens during the acute phase of infection. The lowest concentration of SARS-CoV-2 viral copies this assay can detect is 131 copies/mL. A negative result does not preclude SARS-Cov-2 infection and should not be used as the sole basis for treatment or other patient management decisions. A negative result may occur with  improper specimen collection/handling, submission of specimen other than nasopharyngeal swab, presence of viral mutation(s) within the areas targeted by this assay, and inadequate number of viral copies (<131 copies/mL). A negative result must be combined with clinical observations, patient history, and epidemiological information. The expected result is Negative. Fact Sheet for Patients:  https://www.moore.com/ Fact Sheet for Healthcare Providers:  https://www.young.biz/ This test is not yet ap proved or cleared by the [place] FDA and  has been authorized for detection and/or diagnosis of SARS-CoV-2 by FDA under an Emergency Use Authorization (EUA). This EUA will remain  in effect (meaning this test can be used) for the duration of the COVID-19 declaration under Section 564(b)(1) of the Act, 21 U.S.C. section 360bbb-3(b)(1), unless the authorization is  terminated or revoked sooner.    Influenza A by PCR NEGATIVE NEGATIVE Final   Influenza B by PCR NEGATIVE NEGATIVE Final    Comment: (NOTE) The Xpert Xpress SARS-CoV-2/FLU/RSV assay is intended as an aid in  the diagnosis of influenza from Nasopharyngeal swab specimens and  should not be used as a sole basis for treatment. Nasal washings and  aspirates are unacceptable for Xpert Xpress SARS-CoV-2/FLU/RSV  testing. Fact Sheet for Patients: https://www.moore.com/ Fact Sheet for Healthcare Providers: https://www.young.biz/ This test is not yet approved or cleared by the [place] FDA and  has been authorized for detection and/or diagnosis of SARS-CoV-2 by  FDA  under an Emergency Use Authorization (EUA). This EUA will remain  in effect (meaning this test can be used) for the duration of the  Covid-19 declaration under Section 564(b)(1) of the Act, 21  U.S.C. section 360bbb-3(b)(1), unless the authorization is  terminated or revoked. Performed at [facility], [address] [place], [place] [address]   CULTURE, BLOOD (ROUTINE X 2) w Reflex to ID Panel     Status: None   Collection Time: 07/27/19  7:29 PM   Specimen: BLOOD  Result Value Ref Range Status   Specimen Description BLOOD BLOOD RIGHT HAND  Final   Special Requests   Final    BOTTLES DRAWN AEROBIC AND ANAEROBIC Blood Culture adequate volume   Culture   Final    NO GROWTH 5 DAYS Performed at [facility], [address] [place], [place] [address]    Report Status 08/01/2019 FINAL  Final  Culture, respiratory (non-expectorated)     Status: None   Collection Time: 07/27/19  7:55 PM   Specimen: Tracheal Aspirate; Respiratory  Result Value Ref Range Status   Specimen Description   Final    TRACHEAL ASPIRATE Performed at [facility], [address] [place], [place] [address]    Special Requests NONE  Final   Gram Stain   Final    FEW WBC PRESENT,  PREDOMINANTLY PMN RARE SQUAMOUS EPITHELIAL CELLS PRESENT ABUNDANT GRAM POSITIVE COCCI MODERATE GRAM POSITIVE RODS    Culture   Final    Consistent with normal respiratory flora. Performed at [facility], [address] [place], [place] [address]    Report Status 07/29/2019 FINAL  Final  Culture, blood (Routine X 2) w Reflex to ID Panel     Status: None   Collection Time: 07/27/19 11:59 PM   Specimen: BLOOD  Result Value Ref Range Status   Specimen Description BLOOD LEFT HAND  Final   Special Requests   Final    BOTTLES DRAWN AEROBIC AND ANAEROBIC Blood Culture adequate volume   Culture   Final    NO GROWTH 5 DAYS Performed at [facility], [address] [place], [place] [address]    Report Status 08/02/2019 FINAL  Final         Radiology Studies: DG Thoracic Spine 2 View  Result Date: 08/03/2019 CLINICAL DATA:  Upper and lower back pain today. No known injury. EXAM: THORACIC SPINE 2 VIEWS COMPARISON:  None. FINDINGS: The alignment is maintained. Vertebral body heights are maintained. No significant disc space narrowing. Posterior elements appear intact. No evidence of fracture or focal lesion. There is no paravertebral soft tissue abnormality. Patchy bilateral airspace disease which is characterized on chest radiograph yesterday. IMPRESSION: Negative radiographs of the thoracic spine. Electronically Signed   By: Narda Rutherford M.D.   On: 08/03/2019 19:23   DG Lumbar Spine 2-3 Views  Result Date: 08/03/2019 CLINICAL DATA:  Upper and lower back pain today. No known injury. EXAM: LUMBAR SPINE - 2-3 VIEW COMPARISON:  Report mild from abdominopelvic CT 07/28/2019 FINDINGS: There are 5 non-rib-bearing lumbar vertebra. The alignment is maintained. Vertebral body heights are normal. There is no listhesis. The posterior elements are intact. Disc space narrowing and endplate spurring at L4-L5 and L5-S1. Mild endplate spurring at L3-L4 with preservation of disc spaces. No  fracture. Sacroiliac joints are symmetric and normal. IMPRESSION: Mild degenerative change of the lower lumbar spine with primarily degenerative disc disease at L4-L5 and L5-S1. Electronically Signed   By: Shawna Orleans  Sanford M.D.   On: 08/03/2019 19:24   DG Chest Port 1 View  Result Date: 08/02/2019 CLINICAL DATA:  Fevers EXAM: PORTABLE CHEST 1 VIEW COMPARISON:  07/31/2019 FINDINGS: Cardiac shadow is stable. Vascular congestion and patchy opacities are identified bilaterally but significantly improved when compared with the prior exam. No new infiltrate is seen. No bony abnormality is noted. IMPRESSION: Persistent but improved airspace disease bilaterally. Electronically Signed   By: Alcide Clever M.D.   On: 08/02/2019 23:57        Scheduled Meds: . Chlorhexidine Gluconate Cloth  6 each Topical Q0600  . feeding supplement (ENSURE ENLIVE)  237 mL Oral BID BM  . folic acid  1 mg Oral Daily  . ipratropium-albuterol  3 mL Nebulization BID  . mouth rinse  15 mL Mouth Rinse BID  . multivitamin with minerals  1 tablet Oral Daily  . nicotine  21 mg Transdermal Daily  . pantoprazole  40 mg Oral BID  . phenobarbital  32.4 mg Oral BID  . potassium chloride  40 mEq Oral Q4H  . QUEtiapine  50 mg Oral QHS  . sodium chloride flush  10-40 mL Intracatheter Q12H   Continuous Infusions: . ampicillin-sulbactam (UNASYN) IV 3 g (08/04/19 1606)  . dextrose 5 % and 0.9 % NaCl with KCl 20 mEq/L Stopped (08/03/19 1020)    Assessment & Plan:   Principal Problem:   Acute hepatic encephalopathy Active Problems:   Hypothermia   HLD (hyperlipidemia)   GERD (gastroesophageal reflux disease)   Alcohol abuse   Tobacco abuse   Acute Hypoxic Respiratory Failure Aspiration pneumonia Due to aspiration of bloody vomitus/epistaxis Status post emergent endotracheal intubation Extubated 3/01 Continue  bronchodilator  Continue Unasyn for aspiration pneumonia Wean off oxygen as tolerated   Acute  encephalopathy Alcohol withdrawal Query sundowning Element of hepatic encephalopathy Ammonia level has responded to interventions CIWA for alcohol withdrawal Phenobarbital for withdrawal/agitation Substitute Valium for Ativan for CIWA Discontinued Precedex   Seroquel at evening time due to questionable sundowning  Fever- On ivabx currently Ck bcx ua recently neg. Ck cxr Incentive spirometer With Diarrhea- spoke to ID, ID will order c.diff as pt started with this yesterday and now with fever.    Acute upper GI bleed Continue Protonix 40 mg twice daily GI consultants recommended medical management only H&H stable Continue to monitor Lactulose for elevated ammonia levels    Transaminitis Alcoholic hepatitis Supportive care  Moderate protein calorie malnutrition Passed swallow evaluation by speech path  Advance diet as recommended Electrolyte replacement as necessary   Alcoholism/substance abuse High-dose thiamine Multivitamin supplementation   DVT prophylaxis: SCD Code Status: Full Family Communication: None at bedside Disposition Plan: If stable may discharge back home in 1-2 days , depending on MS, electrolytes, labs, and intake. Barrier: medically not stable as mentioned above.Now also febrile. Need to ck workup and need to be afebrile for 24hrs prior to dc       LOS: 8 days   Time spent: 45 minutes with more than 50% COC    Lynn Ito, MD [facility] Pager [phone]  If 7PM-7AM, please contact night-coverage [email or web address] Password [email or web address] 08/04/2019, 4:30 PM

## 2019-08-04 NOTE — Progress Notes (Addendum)
  Speech Language Pathology Treatment: Dysphagia  Patient Details Name: Joel TANKARD Sr. MRN: [id number] DOB: 29-Mar-1971 Today's Date: 08/04/2019 Time: 3254-9826 SLP Time Calculation (min) (ACUTE ONLY): 30 min  Assessment / Plan / Recommendation Clinical Impression  Pt seen for ongoing assessment of swallowing and toleration of diet; trials to upgrade diet. Pt is much improved in his alertness and follow through w/ tasks since initial evaluation. Pt is fully awake/alert and not drowsy at this session. Pt verbally engaging and talking w/ Wife on phone after session.  Pt appears to present w/ adequate oropharyngeal phase swallow function w/ trials of solid foods given at this session today; pt appears at reduced risk for aspiration when following general aspiration precautions. Pt has adequate Dentition. Pt was educated on using general aspiration precautions w/ trials presented. Pt consumed trials of thin liquids via cup/straw, and soft solids w/ No immediate, overt s/s of aspiration noted - no immediate coughing, no wet vocal quality b/t trials, no decline in respiratory effort during/post trials. Oral phase appeared [facility]; timely bolus mastication/management, A-P transfer, and oral clearing. Pt fed self w/ no significant setup support given. Pt was insightful and sat himself more upright to eat/drink; took his time w/ po trials. Recommend a Regular diet w/ thin liquids; general aspiration precautions. NSG reported pt is swallowing pills w/ liquids "fine". General Reflux precautions as needed. No further ST services indicated at this time; NSG to reconsult if any new needs while admitted. NSG/Pt agreed.      HPI HPI: Pt is a 49 y.o. male with medical history significant of hepatitis C, ETOH use, tobacco abuse, alcohol abuse, seizure, GERD, hyperlipidemia, hepatic encephalopathy requiring intubation in the past, who presents with altered mental status. Per EDP, pt's mother reported that patient was at work  today, but was altered, acting abnormally and appeared quite confused.  They brought the patient to the emergency department. When I saw pt in ED, pt is very confused, not following command and restless. He moves all extremities. No facial droop. Patient required placement of an artificial airway secondary to lethargy with severe epistaxis and hematemesis and aspiration of blood.      SLP Plan  All goals met       Recommendations  Diet recommendations: Regular;Thin liquid Liquids provided via: Cup;Straw(monitor) Medication Administration: Whole meds with liquid(per NSG, pt is doing well w/ this) Supervision: Patient able to self feed Compensations: Minimize environmental distractions;Slow rate;Small sips/bites Postural Changes and/or Swallow Maneuvers: Out of bed for meals;Seated upright 90 degrees;Upright 30-60 min after meal                Oral Care Recommendations: Oral care BID;Oral care before and after PO;Patient independent with oral care Follow up Recommendations: None SLP Visit Diagnosis: Dysphagia, unspecified (R13.10) Plan: All goals met       GO                 Orinda Kenner, MS, CCC-SLP Tailyn Hantz 08/04/2019, 3:11 PM

## 2019-08-04 NOTE — Progress Notes (Signed)
Pharmacy Electrolyte Monitoring Consult:  Pharmacy consulted to assist in monitoring and replacing electrolytes in this 49 y.o. male admitted on 07/27/2019 with alcohol withdrawal and a GI bleed. Patient is extubated and off all sedative infusions. Patient with past medical history significant for Hepatitis C, Alcohol abuse, hepatic encephalopathy.   Labs:  Sodium (mmol/L)  Date Value  08/04/2019 135  10/13/2012 133 (L)   Potassium (mmol/L)  Date Value  08/04/2019 3.2 (L)  10/13/2012 5.0   Magnesium (mg/dL)  Date Value  92/44/6286 2.0   Phosphorus (mg/dL)  Date Value  38/17/7116 3.3   Calcium (mg/dL)  Date Value  57/90/3833 8.2 (L)   Calcium, Total (mg/dL)  Date Value  38/32/9191 8.2 (L)   Albumin (g/dL)  Date Value  66/10/43 2.5 (L)  10/13/2012 4.0   Corrected Ca: 9.4  mg/dL  Assessment/Plan:  1. Electrolytes: Goals of Therapy: K 4.0 - 5.1 mmol/L, Mg 2.0 - 2.4 mg/dL, others wnl  He remains hypokalemic with sodium now improved on revised fluids: continue D5NS with 20 mEq/L KCl at 32mL/hr  Give an additional 40 mEq oral KCl x 4  2. Glucose:   no history of diabetes noted   Monitor via BMPs and initiate SSI as appropriate.  3. Constipation:   LBM 3/5: no intervention required  Pharmacy will continue to monitor and adjust per consult.   Ronnald Ramp, PharmD 08/04/2019 8:00 AM

## 2019-08-05 LAB — GLUCOSE, CAPILLARY
Glucose-Capillary: 102 mg/dL — ABNORMAL HIGH (ref 70–99)
Glucose-Capillary: 106 mg/dL — ABNORMAL HIGH (ref 70–99)
Glucose-Capillary: 113 mg/dL — ABNORMAL HIGH (ref 70–99)
Glucose-Capillary: 116 mg/dL — ABNORMAL HIGH (ref 70–99)
Glucose-Capillary: 116 mg/dL — ABNORMAL HIGH (ref 70–99)
Glucose-Capillary: 117 mg/dL — ABNORMAL HIGH (ref 70–99)
Glucose-Capillary: 119 mg/dL — ABNORMAL HIGH (ref 70–99)
Glucose-Capillary: 123 mg/dL — ABNORMAL HIGH (ref 70–99)
Glucose-Capillary: 124 mg/dL — ABNORMAL HIGH (ref 70–99)
Glucose-Capillary: 98 mg/dL (ref 70–99)

## 2019-08-05 LAB — CBC
HCT: 26.1 % — ABNORMAL LOW (ref 39.0–52.0)
Hemoglobin: 8.7 g/dL — ABNORMAL LOW (ref 13.0–17.0)
MCH: 31.2 pg (ref 26.0–34.0)
MCHC: 33.3 g/dL (ref 30.0–36.0)
MCV: 93.5 fL (ref 80.0–100.0)
Platelets: 301 10*3/uL (ref 150–400)
RBC: 2.79 MIL/uL — ABNORMAL LOW (ref 4.22–5.81)
RDW: 14.6 % (ref 11.5–15.5)
WBC: 8.1 10*3/uL (ref 4.0–10.5)
nRBC: 0 % (ref 0.0–0.2)

## 2019-08-05 LAB — POTASSIUM: Potassium: 3.9 mmol/L (ref 3.5–5.1)

## 2019-08-05 LAB — CLOSTRIDIUM DIFFICILE BY PCR, REFLEXED: Toxigenic C. Difficile by PCR: POSITIVE — AB

## 2019-08-05 MED ORDER — VANCOMYCIN 50 MG/ML ORAL SOLUTION
125.0000 mg | Freq: Four times a day (QID) | ORAL | Status: DC
  Administered 2019-08-05 – 2019-08-06 (×6): 125 mg via ORAL
  Filled 2019-08-05 (×8): qty 2.5

## 2019-08-05 MED ORDER — PHENOBARBITAL 32.4 MG PO TABS: 32.4000 mg | ORAL_TABLET | Freq: Every day | ORAL | Status: DC

## 2019-08-05 MED ORDER — SODIUM CHLORIDE 0.9 % IV SOLN: INTRAVENOUS | Status: DC

## 2019-08-05 MED ORDER — PHENOBARBITAL 32.4 MG PO TABS
16.2000 mg | ORAL_TABLET | Freq: Every day | ORAL | Status: DC
  Administered 2019-08-06: 09:00:00 16.2 mg via ORAL
  Filled 2019-08-05: qty 1

## 2019-08-05 MED ORDER — QUETIAPINE FUMARATE 25 MG PO TABS: 50.0000 mg | ORAL_TABLET | Freq: Every evening | ORAL | Status: DC | PRN

## 2019-08-05 MED ORDER — QUETIAPINE FUMARATE 25 MG PO TABS
50.0000 mg | ORAL_TABLET | Freq: Every day | ORAL | Status: DC
  Administered 2019-08-05: 50 mg via ORAL
  Filled 2019-08-05: qty 2

## 2019-08-05 NOTE — Progress Notes (Signed)
PROGRESS NOTE    Joel REITHER Sr.  ZOX:[id number] DOB: 02-22-1971 DOA: 07/27/2019 PCP: Lynnell Jude, MD    Brief Narrative:  abuse, alcohol abuse,cirrhosis, seizures, GERD, hyperlipidemia, hepatic encephalopathy requiring intubation in the past, who presents with altered mental status. Per EDP, pt's mother reported thatpatient was at work today,but was altered, acting abnormally and appeared quite confused. On arrival pt was encephalopathic with active epistaxis and hematemesis Pt with elevated venous ammonia and etoh levels.  Patient had aspiration event.  Required intubation and mechanical ventilation 2/28-patient is having bloody aspirate from endotracheal tube, will hold off on extubation today. 3/01-agitated but redirectable, tolerating spontaneous breathing trial, plan extubation 3/02-severe agitation overnight started IV phenobarbital, switch benzodiazepine to Valium (per wife works best for patient) 3/03-calmer overall, off of Precedex, phenobarbital adjusted 3/04-agitated again last night, required Precedex again, being weaned off   Consultants:   pccm  Procedures: Mechanical ventilation  Antimicrobials:   Unasyn   Subjective: Pt seen this am. Wife now requesting to stop seroquel and phenobarbital . Pt himself has no new complaints. 5+ bowels of diarrhea last night . This am one diarrhea. No abd pain  Objective: Vitals:   08/04/19 2215 08/05/19 0456 08/05/19 1159 08/05/19 1300  BP: 124/78 124/75 113/67   Pulse: (!) 102 95 (!) 101 88  Resp: 20 18 16    Temp: 99 F (37.2 C) 99.7 F (37.6 C) 99.6 F (37.6 C)   TempSrc: Oral Oral Oral   SpO2: 100% 99% 97%   Weight: 71.4 kg     Height: 5\' 9"  (1.753 m)       Intake/Output Summary (Last 24 hours) at 08/05/2019 1636 Last data filed at 08/05/2019 1341 Gross per 24 hour  Intake 1850.02 ml  Output 400 ml  Net 1450.02 ml   Filed Weights   08/02/19 0628 08/03/19 0316 08/04/19 2215  Weight: 77.2 kg 76.8 kg 71.4 kg      Examination:  General exam: Appears calm and comfortable, NAD Respiratory system: No crackles, mild rhonchi, no wheezing Cardiovascular system: S1 & S2 heard, RRR. No JVD, murmurs, rubs, gallops or clicks. Gastrointestinal system: Abdomen is nondistended, soft and nontender.. Normal bowel sounds heard. Central nervous system: Alert and oriented. No focal neurological deficits.no Asterexis Extremities: No edema Skin: Warm dry Psychiatry: Judgement and insight appear normal. Mood & affect appropriate.     Data Reviewed: I have personally reviewed following labs and imaging studies  CBC: Recent Labs  Lab 07/31/19 0012 07/31/19 0751 08/01/19 0540 08/01/19 1214 08/02/19 0639 08/02/19 2345 08/03/19 0505 08/04/19 0619 08/05/19 0552  WBC 8.9  --  10.8*  --   --  10.5 11.4* 10.2 8.1  NEUTROABS 7.0  --   --   --   --  8.0*  --   --   --   HGB 9.4*   < > 9.6*   < > 8.8* 9.1* 9.0* 8.9* 8.7*  HCT 27.8*   < > 27.6*   < > 25.2* 26.5* 26.8* 26.2* 26.1*  MCV 94.2  --  92.0  --   --  92.0 93.4 92.6 93.5  PLT 129*  --  180  --   --  200 219 248 301   < > = values in this interval not displayed.   Basic Metabolic Panel: Recent Labs  Lab 07/31/19 0012 07/31/19 0012 08/01/19 0540 08/02/19 8119 08/03/19 0505 08/04/19 0619 08/05/19 0552  NA 141  --  138 133* 136 135  --  K 3.6   < > 3.0* 3.6 3.6 3.2* 3.9  CL 112*  --  101 101 105 103  --   CO2 21*  --  29 21* 21* 19*  --   GLUCOSE 126*  --  123* 115* 98 90  --   BUN 11  --  6 7 8 11   --   CREATININE 0.82  --  0.87 0.86 0.88 0.76  --   CALCIUM 7.7*  --  7.9* 8.0* 8.1* 8.2*  --   MG 2.0  --  1.5* 1.9 2.2 2.0  --   PHOS 2.4*  --  2.9 3.1 3.5 3.3  --    < > = values in this interval not displayed.   GFR: Estimated Creatinine Clearance: 110.5 mL/min (by C-G formula based on SCr of 0.76 mg/dL). Liver Function Tests: Recent Labs  Lab 07/30/19 0544 08/01/19 0540 08/03/19 0505 08/04/19 0619  AST 29  --   --   --   ALT 24  --    --   --   ALKPHOS 67  --   --   --   BILITOT 0.6  --   --   --   PROT 5.8*  --   --   --   ALBUMIN 2.7* 2.6* 2.6* 2.5*   No results for input(s): LIPASE, AMYLASE in the last 168 hours. Recent Labs  Lab 07/30/19 0544  AMMONIA 48*   Coagulation Profile: No results for input(s): INR, PROTIME in the last 168 hours. Cardiac Enzymes: No results for input(s): CKTOTAL, CKMB, CKMBINDEX, TROPONINI in the last 168 hours. BNP (last 3 results) No results for input(s): PROBNP in the last 8760 hours. HbA1C: No results for input(s): HGBA1C in the last 72 hours. CBG: Recent Labs  Lab 08/04/19 2157 08/05/19 0001 08/05/19 0454 08/05/19 0812 08/05/19 1153  GLUCAP 122* 102* 106* 123* 119*   Lipid Profile: No results for input(s): CHOL, HDL, LDLCALC, TRIG, CHOLHDL, LDLDIRECT in the last 72 hours. Thyroid Function Tests: No results for input(s): TSH, T4TOTAL, FREET4, T3FREE, THYROIDAB in the last 72 hours. Anemia Panel: No results for input(s): VITAMINB12, FOLATE, FERRITIN, TIBC, IRON, RETICCTPCT in the last 72 hours. Sepsis Labs: Recent Labs  Lab 08/02/19 2345  PROCALCITON 0.39    Recent Results (from the past 240 hour(s))  Respiratory Panel by RT PCR (Flu A&B, Covid) - Nasopharyngeal Swab     Status: None   Collection Time: 07/27/19  3:34 PM   Specimen: Nasopharyngeal Swab  Result Value Ref Range Status   SARS Coronavirus 2 by RT PCR NEGATIVE NEGATIVE Final    Comment: (NOTE) SARS-CoV-2 target nucleic acids are NOT DETECTED. The SARS-CoV-2 RNA is generally detectable in upper respiratoy specimens during the acute phase of infection. The lowest concentration of SARS-CoV-2 viral copies this assay can detect is 131 copies/mL. A negative result does not preclude SARS-Cov-2 infection and should not be used as the sole basis for treatment or other patient management decisions. A negative result may occur with  improper specimen collection/handling, submission of specimen other than  nasopharyngeal swab, presence of viral mutation(s) within the areas targeted by this assay, and inadequate number of viral copies (<131 copies/mL). A negative result must be combined with clinical observations, patient history, and epidemiological information. The expected result is Negative. Fact Sheet for Patients:  07/29/19 Fact Sheet for Healthcare Providers:  https://www.moore.com/ This test is not yet ap proved or cleared by the https://www.young.biz/ and  has been authorized  for detection and/or diagnosis of SARS-CoV-2 by FDA under an Emergency Use Authorization (EUA). This EUA will remain  in effect (meaning this test can be used) for the duration of the COVID-19 declaration under Section 564(b)(1) of the Act, 21 U.S.C. section 360bbb-3(b)(1), unless the authorization is terminated or revoked sooner.    Influenza A by PCR NEGATIVE NEGATIVE Final   Influenza B by PCR NEGATIVE NEGATIVE Final    Comment: (NOTE) The Xpert Xpress SARS-CoV-2/FLU/RSV assay is intended as an aid in  the diagnosis of influenza from Nasopharyngeal swab specimens and  should not be used as a sole basis for treatment. Nasal washings and  aspirates are unacceptable for Xpert Xpress SARS-CoV-2/FLU/RSV  testing. Fact Sheet for Patients: https://www.moore.com/ Fact Sheet for Healthcare Providers: https://www.young.biz/ This test is not yet approved or cleared by the [place] FDA and  has been authorized for detection and/or diagnosis of SARS-CoV-2 by  FDA under an Emergency Use Authorization (EUA). This EUA will remain  in effect (meaning this test can be used) for the duration of the  Covid-19 declaration under Section 564(b)(1) of the Act, 21  U.S.C. section 360bbb-3(b)(1), unless the authorization is  terminated or revoked. Performed at [facility], [address] [place], [place] [address]   MRSA  PCR Screening     Status: None   Collection Time: 07/27/19  5:39 PM   Specimen: Nasopharyngeal Swab  Result Value Ref Range Status   MRSA by PCR NEGATIVE NEGATIVE Final    Comment:        The GeneXpert MRSA Assay (FDA approved for NASAL specimens only), is one component of a comprehensive MRSA colonization surveillance program. It is not intended to diagnose MRSA infection nor to guide or monitor treatment for MRSA infections. Performed at [facility], [address] [place], [place] [address]   Respiratory Panel by RT PCR (Flu A&B, Covid) - Nasopharyngeal Swab     Status: None   Collection Time: 07/27/19  5:39 PM   Specimen: Nasopharyngeal Swab  Result Value Ref Range Status   SARS Coronavirus 2 by RT PCR NEGATIVE NEGATIVE Final    Comment: (NOTE) SARS-CoV-2 target nucleic acids are NOT DETECTED. The SARS-CoV-2 RNA is generally detectable in upper respiratoy specimens during the acute phase of infection. The lowest concentration of SARS-CoV-2 viral copies this assay can detect is 131 copies/mL. A negative result does not preclude SARS-Cov-2 infection and should not be used as the sole basis for treatment or other patient management decisions. A negative result may occur with  improper specimen collection/handling, submission of specimen other than nasopharyngeal swab, presence of viral mutation(s) within the areas targeted by this assay, and inadequate number of viral copies (<131 copies/mL). A negative result must be combined with clinical observations, patient history, and epidemiological information. The expected result is Negative. Fact Sheet for Patients:  https://www.moore.com/ Fact Sheet for Healthcare Providers:  https://www.young.biz/ This test is not yet ap proved or cleared by the [place] FDA and  has been authorized for detection and/or diagnosis of SARS-CoV-2 by FDA under an Emergency Use Authorization  (EUA). This EUA will remain  in effect (meaning this test can be used) for the duration of the COVID-19 declaration under Section 564(b)(1) of the Act, 21 U.S.C. section 360bbb-3(b)(1), unless the authorization is terminated or revoked sooner.    Influenza A by PCR NEGATIVE NEGATIVE Final   Influenza B by PCR NEGATIVE NEGATIVE Final    Comment: (NOTE) The Xpert Xpress SARS-CoV-2/FLU/RSV assay  is intended as an aid in  the diagnosis of influenza from Nasopharyngeal swab specimens and  should not be used as a sole basis for treatment. Nasal washings and  aspirates are unacceptable for Xpert Xpress SARS-CoV-2/FLU/RSV  testing. Fact Sheet for Patients: https://www.moore.com/https://www.fda.gov/media/142436/download Fact Sheet for Healthcare Providers: https://www.young.biz/https://www.fda.gov/media/142435/download This test is not yet approved or cleared by the [place] FDA and  has been authorized for detection and/or diagnosis of SARS-CoV-2 by  FDA under an Emergency Use Authorization (EUA). This EUA will remain  in effect (meaning this test can be used) for the duration of the  Covid-19 declaration under Section 564(b)(1) of the Act, 21  U.S.C. section 360bbb-3(b)(1), unless the authorization is  terminated or revoked. Performed at [facility], [address] [place], [place] [address]   CULTURE, BLOOD (ROUTINE X 2) w Reflex to ID Panel     Status: None   Collection Time: 07/27/19  7:29 PM   Specimen: BLOOD  Result Value Ref Range Status   Specimen Description BLOOD BLOOD RIGHT HAND  Final   Special Requests   Final    BOTTLES DRAWN AEROBIC AND ANAEROBIC Blood Culture adequate volume   Culture   Final    NO GROWTH 5 DAYS Performed at [facility], [address] [place], [place] [address]    Report Status 08/01/2019 FINAL  Final  Culture, respiratory (non-expectorated)     Status: None   Collection Time: 07/27/19  7:55 PM   Specimen: Tracheal Aspirate; Respiratory  Result Value Ref Range  Status   Specimen Description   Final    TRACHEAL ASPIRATE Performed at [facility], [address] [place], [place] [address]    Special Requests NONE  Final   Gram Stain   Final    FEW WBC PRESENT, PREDOMINANTLY PMN RARE SQUAMOUS EPITHELIAL CELLS PRESENT ABUNDANT GRAM POSITIVE COCCI MODERATE GRAM POSITIVE RODS    Culture   Final    Consistent with normal respiratory flora. Performed at [facility], [address] [place], [place] [address]    Report Status 07/29/2019 FINAL  Final  Culture, blood (Routine X 2) w Reflex to ID Panel     Status: None   Collection Time: 07/27/19 11:59 PM   Specimen: BLOOD  Result Value Ref Range Status   Specimen Description BLOOD LEFT HAND  Final   Special Requests   Final    BOTTLES DRAWN AEROBIC AND ANAEROBIC Blood Culture adequate volume   Culture   Final    NO GROWTH 5 DAYS Performed at [facility], [address] [place], [place] [address]    Report Status 08/02/2019 FINAL  Final  CULTURE, BLOOD (ROUTINE X 2) w Reflex to ID Panel     Status: None (Preliminary result)   Collection Time: 08/04/19  4:45 PM   Specimen: BLOOD  Result Value Ref Range Status   Specimen Description BLOOD RAC  Final   Special Requests   Final    BOTTLES DRAWN AEROBIC AND ANAEROBIC Blood Culture results may not be optimal due to an excessive volume of blood received in culture bottles   Culture   Final    NO GROWTH < 24 HOURS Performed at [facility], [address] [place], [place] [address]    Report Status PENDING  Incomplete  CULTURE, BLOOD (ROUTINE X 2) w Reflex to ID Panel     Status: None (Preliminary result)   Collection Time: 08/04/19  4:52 PM  Specimen: BLOOD  Result Value Ref Range Status   Specimen Description BLOOD RW  Final   Special Requests   Final    BOTTLES DRAWN AEROBIC AND ANAEROBIC Blood Culture adequate volume   Culture   Final    NO GROWTH < 24 HOURS Performed at [facility], [address], [place], [place] [address]    Report Status PENDING  Incomplete  C difficile quick scan w PCR reflex     Status: Abnormal   Collection Time: 08/04/19  6:02 PM   Specimen: STOOL  Result Value Ref Range Status   C Diff antigen POSITIVE (A) NEGATIVE Final   C Diff toxin NEGATIVE NEGATIVE Final   C Diff interpretation Results are indeterminate. See PCR results.  Final    Comment: Performed at [facility], [address] [place], [place] [address]  C. Diff by PCR, Reflexed     Status: Abnormal   Collection Time: 08/04/19  6:02 PM  Result Value Ref Range Status   Toxigenic C. Difficile by PCR POSITIVE (A) NEGATIVE Final    Comment: Positive for toxigenic C. difficile with little to no toxin production. Only treat if clinical presentation suggests symptomatic illness. Performed at [facility], [address] [place], [place] [address]          Radiology Studies: DG Thoracic Spine 2 View  Result Date: 08/03/2019 CLINICAL DATA:  Upper and lower back pain today. No known injury. EXAM: THORACIC SPINE 2 VIEWS COMPARISON:  None. FINDINGS: The alignment is maintained. Vertebral body heights are maintained. No significant disc space narrowing. Posterior elements appear intact. No evidence of fracture or focal lesion. There is no paravertebral soft tissue abnormality. Patchy bilateral airspace disease which is characterized on chest radiograph yesterday. IMPRESSION: Negative radiographs of the thoracic spine. Electronically Signed   By: Narda Rutherford M.D.   On: 08/03/2019 19:23   DG Lumbar Spine 2-3 Views  Result Date: 08/03/2019 CLINICAL DATA:  Upper and lower back pain today. No known injury. EXAM: LUMBAR SPINE - 2-3 VIEW COMPARISON:  Report mild from abdominopelvic CT 07/28/2019 FINDINGS: There are 5 non-rib-bearing lumbar vertebra. The alignment is maintained. Vertebral body heights are normal. There is no listhesis. The posterior elements are intact. Disc  space narrowing and endplate spurring at L4-L5 and L5-S1. Mild endplate spurring at L3-L4 with preservation of disc spaces. No fracture. Sacroiliac joints are symmetric and normal. IMPRESSION: Mild degenerative change of the lower lumbar spine with primarily degenerative disc disease at L4-L5 and L5-S1. Electronically Signed   By: Narda Rutherford M.D.   On: 08/03/2019 19:24   DG Chest Port 1 View  Result Date: 08/04/2019 CLINICAL DATA:  49 year old with current history of hepatitis C and chronic alcohol use, presenting with a fever of 100.1 degrees Fahrenheit. Current smoker. EXAM: PORTABLE CHEST 1 VIEW COMPARISON:  08/02/2019 and earlier. FINDINGS: Cardiomediastinal silhouette unremarkable and unchanged. Continued improvement in the patchy pneumonia in both lungs since the examinations 2 days ago, significantly improved since the examination 4 days ago, with only minimal opacities persisting. No new pulmonary parenchymal abnormalities. IMPRESSION: Continued improvement in the bilateral pneumonia since the examinations 2 days ago, with only minimal patchy opacities persisting. No new abnormalities. Electronically Signed   By: Hulan Saas M.D.   On: 08/04/2019 17:05        Scheduled Meds: . Chlorhexidine Gluconate Cloth  6 each Topical Q0600  . feeding supplement (ENSURE ENLIVE)  237 mL Oral BID BM  .  folic acid  1 mg Oral Daily  . ipratropium-albuterol  3 mL Nebulization BID  . multivitamin with minerals  1 tablet Oral Daily  . nicotine  21 mg Transdermal Daily  . pantoprazole  40 mg Oral BID  . phenobarbital  32.4 mg Oral BID  . QUEtiapine  50 mg Oral QHS  . sodium chloride flush  10-40 mL Intracatheter Q12H  . vancomycin  125 mg Oral QID   Continuous Infusions: . dextrose 5 % and 0.9 % NaCl with KCl 20 mEq/L 75 mL/hr at 08/05/19 1341    Assessment & Plan:   Principal Problem:   Acute hepatic encephalopathy Active Problems:   Hypothermia   HLD (hyperlipidemia)   GERD  (gastroesophageal reflux disease)   Alcohol abuse   Tobacco abuse   Acute Hypoxic Respiratory Failure Aspiration pneumonia Due to aspiration of bloody vomitus/epistaxis Status post emergent endotracheal intubation Extubated 3/01 Continue  bronchodilator  Completed abx course, d/c unasyn Wean off oxygen as tolerated   Acute encephalopathy Alcohol withdrawal Query sundowning Element of hepatic encephalopathy Ammonia level has responded to interventions CIWA for alcohol withdrawal Phenobarbital for withdrawal/agitation..>will wean off per wife request Substitute Valium for Ativan for CIWA Discontinued Precedex   Seroquel at evening time due to questionable sundowning- but wife wants him not to get it, so placed it as prn, but then spoke to pt who is totally competent, stated he will take it  Fever/diarrhea See c.diff results.Marland Kitchen>Ag +  Toxin neg. Toxigenic + Spoke to ID, since pt having diarrhea, will treat with vancomycin po x 10 days unasyn completed and d/c'd Contact precautions F/u bcx ua recently neg.   Acute upper GI bleed Continue Protonix 40 mg twice daily GI consultants recommended medical management only H&H stable Continue to monitor Lactulose for elevated ammonia levels    Transaminitis Alcoholic hepatitis Supportive care  Moderate protein calorie malnutrition Passed swallow evaluation by speech path  Diet advanced Electrolyte replacement as necessary   Alcoholism/substance abuse High-dose thiamine Multivitamin supplementation   DVT prophylaxis: SCD Code Status: Full Family Communication: None at bedside Disposition Plan:will d/c in am if diarrhea improves and starts forming. Barrier: diarrhea.      LOS: 9 days   Time spent: 45 minutes with more than 50% COC    Lynn Ito, MD [facility] Pager [phone]  If 7PM-7AM, please contact night-coverage [email or web address] Password [facility] 08/05/2019, 4:36 PM Patient ID: Joel OJEDA Sr., male   DOB: 04/28/1971, 49 y.o.   MRN: [id number]

## 2019-08-05 NOTE — Progress Notes (Signed)
Pharmacy Electrolyte Monitoring Consult:  Pharmacy consulted to assist in monitoring and replacing electrolytes in this 49 y.o. male admitted on 07/27/2019 with alcohol withdrawal and a GI bleed. Patient is extubated and off all sedative infusions. Patient with past medical history significant for Hepatitis C, Alcohol abuse, hepatic encephalopathy.   Labs:  Sodium (mmol/L)  Date Value  08/04/2019 135  10/13/2012 133 (L)   Potassium (mmol/L)  Date Value  08/05/2019 3.9  10/13/2012 5.0   Magnesium (mg/dL)  Date Value  76/22/6333 2.0   Phosphorus (mg/dL)  Date Value  54/56/2563 3.3   Calcium (mg/dL)  Date Value  89/37/3428 8.2 (L)   Calcium, Total (mg/dL)  Date Value  76/81/1572 8.2 (L)   Albumin (g/dL)  Date Value  62/07/5595 2.5 (L)  10/13/2012 4.0   Corrected Ca: 9.4  mg/dL  Assessment/Plan:  1. Electrolytes: Goals of Therapy: K 4.0 - 5.1 mmol/L, Mg 2.0 - 2.4 mg/dL, others wnl  He remains hypokalemic with sodium now improved on revised fluids: continue D5NS with 20 mEq/L KCl at 45mL/hr  Potentially switch to fluids without KCl once K+ > 4.   2. Glucose:   no history of diabetes noted   Monitor via BMPs and initiate SSI as appropriate.  3. Constipation:   LBM 3/6: no intervention required  Pharmacy will continue to monitor and adjust per consult.   Ronnald Ramp, PharmD 08/05/2019 7:35 AM

## 2019-08-06 LAB — BASIC METABOLIC PANEL
Anion gap: 6 (ref 5–15)
BUN: 13 mg/dL (ref 6–20)
CO2: 26 mmol/L (ref 22–32)
Calcium: 8.7 mg/dL — ABNORMAL LOW (ref 8.9–10.3)
Chloride: 101 mmol/L (ref 98–111)
Creatinine, Ser: 0.66 mg/dL (ref 0.61–1.24)
GFR calc Af Amer: >60 mL/min (ref >60)
GFR calc non Af Amer: >60 mL/min (ref >60)
Glucose, Bld: 99 mg/dL (ref 70–99)
Potassium: 3.8 mmol/L (ref 3.5–5.1)
Sodium: 133 mmol/L — ABNORMAL LOW (ref 135–145)

## 2019-08-06 MED ORDER — ALBUTEROL SULFATE HFA 108 (90 BASE) MCG/ACT IN AERS: 2.0000 | INHALATION_SPRAY | Freq: Four times a day (QID) | RESPIRATORY_TRACT | 0 refills | Status: AC | PRN

## 2019-08-06 MED ORDER — VANCOMYCIN HCL 125 MG PO CAPS: 125.0000 mg | ORAL_CAPSULE | Freq: Four times a day (QID) | ORAL | 0 refills | Status: AC

## 2019-08-06 MED ORDER — ALBUTEROL SULFATE HFA 108 (90 BASE) MCG/ACT IN AERS
2.0000 | INHALATION_SPRAY | Freq: Four times a day (QID) | RESPIRATORY_TRACT | Status: DC | PRN
  Filled 2019-08-06: qty 6.7

## 2019-08-06 MED ORDER — PANTOPRAZOLE SODIUM 40 MG PO TBEC: 40.0000 mg | DELAYED_RELEASE_TABLET | Freq: Two times a day (BID) | ORAL | 0 refills | Status: AC

## 2019-08-06 NOTE — TOC Transition Note (Signed)
Transition of Care [facility]) - CM/SW Discharge Note   Patient Details  Name: Joel Tanner Sr. MRN: [id number] Date of Birth: April 08, 1971  [facility] CM/SW Contact:  Chapman Fitch, RN Phone Number: 08/06/2019, 1:12 PM   Clinical Narrative:     Patient to discharge home today Wife transporting at discharge  RNCM confirmed with Medication Management  That they have oral vanc, albuterol, and protonix for discharge.  prescriptions faxed.  Wife to pick up medications at discharge     Barriers to Discharge: Continued Medical Work up   Patient Goals and CMS Choice        Discharge Placement                       Discharge Plan and Services                                     Social Determinants of Health (SDOH) Interventions     Readmission Risk Interventions Readmission Risk Prevention Plan 08/06/2019 08/01/2019  Transportation Screening Complete Complete  Medication Review Ocean[facility]) Complete Complete  PCP or Specialist appointment within 3-5 days of discharge (No Data) Complete  HRI or Home Care Consult (No Data) Complete  Palliative Care Screening Not Applicable -  Skilled Nursing Facility Not Applicable -  Some recent data might be hidden

## 2019-08-06 NOTE — Discharge Summary (Signed)
Joel OSLEY Sr. QVZ:[id number] DOB: [birth year] DOA: 07/27/2019  PCP: Dortha Kern, MD  Admit date: 07/27/2019 Discharge date: 08/06/2019  Admitted From: home Disposition:  home  Recommendations for Outpatient Follow-up:  1. Follow up with PCP in 1 week 2. Please obtain BMP/CBC in one week 3.   Home Health: No   Discharge Condition:Stable CODE STATUS:full  Diet recommendation: Heart Healthy / Carb Modified / Regular / Dysphagia  Brief/Interim Summary: Joel Blanco Sr. is a 49 y.o. male with medical history significant of hepatitis C, tobacco abuse, alcohol abuse, seizure, GERD, hyperlipidemia, hepatic encephalopathy requiring intubation in the past, who presented with altered mental status.pt was found to have ammonia 302, alcohol level 104.  Patient was admitted to the stepdown unit.  Pulmonary critical care was consulted. Patient with elevated venous ammonia and etoh levels. Patient had aspiration event. Required intubation and mechanical ventilation.  He was started on IV phenobarbital and Valium for severe agitation.  He was also on Precedex and once he was calmer he was extubated on 3/4.  His phenobarbital was tapered off.  He was being treated with IV antibiotics, Unasyn for aspiration pneumonia.  He completed the course.  He was placed on CIWA.  He he was having questionable sundowning and he was placed on Seroquel for that.  He began having diarrhea and became febrile.  He is stool was sent for C. difficile once infectious disease approved it.  He was C. difficile antigen positive, C. difficile toxin negative, toxigenic C. difficile positive.  He was started on vancomycin p.o.  He will need to complete 9 more days of vancomycin.  His diarrhea has resolved.  He only had 1 bowel movement overnight.  Patient denies any further diarrhea, abdominal pain nausea or vomiting.  He is stable to be discharged home.  Insurance we are trying to get him free meds.  I have updated the wife and I  have told her he needs to follow-up with the primary care.  Consult extensively on alcohol cessation.  He also had acute upper GI bleed which GI recommended medical management only. He started on Protonix and  he also had lactulose for his ammonium level.  Medical management only.Please note the above information was taken from the patient;s chart .  Discharge Diagnoses:  Principal Problem:   Acute hepatic encephalopathy Active Problems:   Hypothermia   HLD (hyperlipidemia)   GERD (gastroesophageal reflux disease)   Alcohol abuse   Tobacco abuse    Discharge Instructions  Discharge Instructions    Call MD for:  temperature >100.4   Complete by: As directed    Diet - low sodium heart healthy   Complete by: As directed    Increase activity slowly   Complete by: As directed      Allergies as of 08/06/2019   No Known Allergies     Medication List    STOP taking these medications   omeprazole 20 MG capsule Commonly known as: PRILOSEC     TAKE these medications   albuterol 108 (90 Base) MCG/ACT inhaler Commonly known as: VENTOLIN HFA Inhale 2 puffs into the lungs every 6 (six) hours as needed for wheezing or shortness of breath.   atorvastatin 40 MG tablet Commonly known as: LIPITOR Take 40 mg by mouth daily.   B-100 B-COMPLEX PO Take 1 tablet by mouth daily.   disulfiram 250 MG tablet Commonly known as: ANTABUSE Take 250 mg by mouth daily.   folic acid 1 MG tablet  Commonly known as: FOLVITE Take 1 tablet (1 mg total) by mouth daily.   pantoprazole 40 MG tablet Commonly known as: PROTONIX Take 1 tablet (40 mg total) by mouth 2 (two) times daily.   tadalafil 20 MG tablet Commonly known as: CIALIS Take 20 mg by mouth daily as needed for erectile dysfunction.   vancomycin 125 MG capsule Commonly known as: Vancocin HCl Take 1 capsule (125 mg total) by mouth 4 (four) times daily for 9 days.   venlafaxine XR 150 MG 24 hr capsule Commonly known as:  EFFEXOR-XR Take 150 mg by mouth daily.      Follow-up Information    Dortha Kern, MD Follow up in 1 week(s).   Specialty: Family Medicine Contact information: [address] [place] [address] [phone]          No Known Allergies  Consultations:     Procedures/Studies: CT ABDOMEN PELVIS WO CONTRAST  Result Date: 07/28/2019 CLINICAL DATA:  Nausea and vomiting. Hepatitis C. Tobacco abuse. Alcohol abuse. Cirrhosis. Hepatic encephalopathy. Mental status changes. EXAM: CT ABDOMEN AND PELVIS WITHOUT CONTRAST TECHNIQUE: Multidetector CT imaging of the abdomen and pelvis was performed following the standard protocol without IV contrast. COMPARISON:  Plain films 07/27/2019. Ultrasound 02/12/2019. No prior CT. FINDINGS: Lower chest: Bibasilar atelectasis.  Normal heart size. Hepatobiliary: Liver not well evaluated secondary to lack of IV contrast and patient arm position. Hepatomegaly at 22 cm. There also extensive EKG and wire lead artifacts. Mild cirrhosis. Nonspecific gallbladder wall thickening is suspected. No calcified stone or pericholecystic edema. No biliary duct dilatation. Pancreas: Normal, without mass or ductal dilatation. Spleen: Normal in size, without focal abnormality. Adrenals/Urinary Tract: Normal adrenal glands. No renal calculi or hydronephrosis. Foley catheter within the urinary bladder. Stomach/Bowel: Tiny hiatal hernia. Nasogastric tube terminating at the gastric body. Rectal catheter. Normal colon, appendix, and terminal ileum. Normal small bowel. Vascular/Lymphatic: Aortic atherosclerosis. No abdominopelvic adenopathy. Reproductive: Normal prostate. Other: Trace pelvic fluid.  No free intraperitoneal air. Musculoskeletal: No acute osseous abnormality. Degenerative disc disease at L4-5 and L5-S1. IMPRESSION: 1. Multifactorial degradation, primarily involving the upper abdomen, as detailed above. 2.  No acute process in the abdomen or pelvis. 3. Cirrhosis and  hepatomegaly. 4. Trace fluid within the pelvis. 5.  Aortic Atherosclerosis (ICD10-I70.0). Electronically Signed   By: Jeronimo Greaves M.D.   On: 07/28/2019 13:48   DG Thoracic Spine 2 View  Result Date: 08/03/2019 CLINICAL DATA:  Upper and lower back pain today. No known injury. EXAM: THORACIC SPINE 2 VIEWS COMPARISON:  None. FINDINGS: The alignment is maintained. Vertebral body heights are maintained. No significant disc space narrowing. Posterior elements appear intact. No evidence of fracture or focal lesion. There is no paravertebral soft tissue abnormality. Patchy bilateral airspace disease which is characterized on chest radiograph yesterday. IMPRESSION: Negative radiographs of the thoracic spine. Electronically Signed   By: Narda Rutherford M.D.   On: 08/03/2019 19:23   DG Lumbar Spine 2-3 Views  Result Date: 08/03/2019 CLINICAL DATA:  Upper and lower back pain today. No known injury. EXAM: LUMBAR SPINE - 2-3 VIEW COMPARISON:  Report mild from abdominopelvic CT 07/28/2019 FINDINGS: There are 5 non-rib-bearing lumbar vertebra. The alignment is maintained. Vertebral body heights are normal. There is no listhesis. The posterior elements are intact. Disc space narrowing and endplate spurring at L4-L5 and L5-S1. Mild endplate spurring at L3-L4 with preservation of disc spaces. No fracture. Sacroiliac joints are symmetric and normal. IMPRESSION: Mild degenerative change of the lower lumbar spine with  primarily degenerative disc disease at L4-L5 and L5-S1. Electronically Signed   By: Narda RutherfordMelanie  Sanford M.D.   On: 08/03/2019 19:24   CT HEAD WO CONTRAST  Result Date: 07/28/2019 CLINICAL DATA:  Encephalopathy. EXAM: CT HEAD WITHOUT CONTRAST TECHNIQUE: Contiguous axial images were obtained from the base of the skull through the vertex without intravenous contrast. COMPARISON:  May 30, 2019 FINDINGS: Brain: No evidence of acute infarction, hemorrhage, hydrocephalus, extra-axial collection or mass lesion/mass  effect. Vascular: No hyperdense vessel or unexpected calcification. Skull: Normal. Negative for fracture or focal lesion. Sinuses/Orbits: Mild sphenoid and scattered ethmoid sinus disease. Visualized portions the orbits are unremarkable. Other: None. IMPRESSION: 1. No evidence of acute intracranial pathology. 2. Mild sphenoid and scattered ethmoid sinus disease. Electronically Signed   By: Donzetta KohutGeoffrey  Wile M.D.   On: 07/28/2019 13:41   DG Chest Port 1 View  Result Date: 08/04/2019 CLINICAL DATA:  49 year old with current history of hepatitis C and chronic alcohol use, presenting with a fever of 100.1 degrees Fahrenheit. Current smoker. EXAM: PORTABLE CHEST 1 VIEW COMPARISON:  08/02/2019 and earlier. FINDINGS: Cardiomediastinal silhouette unremarkable and unchanged. Continued improvement in the patchy pneumonia in both lungs since the examinations 2 days ago, significantly improved since the examination 4 days ago, with only minimal opacities persisting. No new pulmonary parenchymal abnormalities. IMPRESSION: Continued improvement in the bilateral pneumonia since the examinations 2 days ago, with only minimal patchy opacities persisting. No new abnormalities. Electronically Signed   By: Hulan Saashomas  Lawrence M.D.   On: 08/04/2019 17:05   DG Chest Port 1 View  Result Date: 08/02/2019 CLINICAL DATA:  Fevers EXAM: PORTABLE CHEST 1 VIEW COMPARISON:  07/31/2019 FINDINGS: Cardiac shadow is stable. Vascular congestion and patchy opacities are identified bilaterally but significantly improved when compared with the prior exam. No new infiltrate is seen. No bony abnormality is noted. IMPRESSION: Persistent but improved airspace disease bilaterally. Electronically Signed   By: Alcide CleverMark  Lukens M.D.   On: 08/02/2019 23:57   DG Chest Port 1 View  Result Date: 07/31/2019 CLINICAL DATA:  Acute respiratory failure EXAM: PORTABLE CHEST 1 VIEW COMPARISON:  07/30/2019, 07/29/2019 FINDINGS: Interval removal of endotracheal and esophageal  tubes. Significant interval worsening of bilateral airspace disease. Possible pleural effusions. Stable cardiomediastinal silhouette. No pneumothorax. IMPRESSION: 1. Removal of endotracheal and esophageal tubes. 2. Significant interval worsening of bilateral airspace disease with development of possible pleural effusions. Electronically Signed   By: Jasmine PangKim  Fujinaga M.D.   On: 07/31/2019 20:21   DG Chest Port 1 View  Result Date: 07/30/2019 CLINICAL DATA:  Hypoxia EXAM: PORTABLE CHEST 1 VIEW COMPARISON:  July 29, 2019 FINDINGS: Endotracheal tube is seen 3 cm above the carina. NG tube is seen below the diaphragm. There is interval slight worsening in the hazy/patchy airspace opacity within the right lower lung. Again noted is patchy air left infrahilar opacity. No pleural effusion is seen. No acute osseous abnormality. IMPRESSION: ET tube and NG tube in satisfactory position. Multifocal airspace opacities, with interval worsening in the right lower lung. Electronically Signed   By: Jonna ClarkBindu  Avutu M.D.   On: 07/30/2019 04:36   DG Chest Port 1 View  Result Date: 07/29/2019 CLINICAL DATA:  Intubated EXAM: PORTABLE CHEST 1 VIEW COMPARISON:  07/28/2019 FINDINGS: Endotracheal tube with the tip 3.4 cm above the carina. Nasogastric tube with the tip projecting over the stomach. Bilateral patchy interstitial and alveolar airspace opacities. No pleural effusion or pneumothorax. Stable cardiomediastinal silhouette. No acute osseous abnormality. IMPRESSION: 1. Endotracheal tube with the tip  3.4 cm above the carina. 2. Bilateral interstitial and patchy alveolar airspace opacities which may reflect mild pulmonary edema versus multilobar pneumonia. Electronically Signed   By: Elige Ko   On: 07/29/2019 10:48   DG Chest Port 1 View  Result Date: 07/28/2019 CLINICAL DATA:  Endotracheal tube EXAM: PORTABLE CHEST 1 VIEW COMPARISON:  07/28/2019 FINDINGS: The endotracheal tube terminates approximately 4.2 cm above the  carina. The enteric tube extends below the left hemidiaphragm. There are perihilar airspace opacities with prominent interstitial lung markings. No pneumothorax. No convincing pleural effusion. The heart size is stable from prior study. IMPRESSION: Satisfactory positioning of the endotracheal and enteric tubes. Developing perihilar airspace opacities which may represent pulmonary edema in the appropriate clinical setting. Electronically Signed   By: Katherine Mantle M.D.   On: 07/28/2019 23:34   DG Chest Port 1 View  Result Date: 07/28/2019 CLINICAL DATA:  Acute respiratory failure, intubated EXAM: PORTABLE CHEST 1 VIEW COMPARISON:  07/27/2019 FINDINGS: Single frontal view of the chest demonstrates stable position of the endotracheal tube and enteric catheters. The cardiac silhouette is unchanged. Stable diffuse interstitial prominence without airspace disease, effusion, or pneumothorax. There is mild central vascular congestion. IMPRESSION: 1. Central vascular congestion without superimposed airspace disease. 2. Stable support devices. Electronically Signed   By: Sharlet Salina M.D.   On: 07/28/2019 03:20   DG Chest Port 1 View  Result Date: 07/27/2019 CLINICAL DATA:  Intubated, hepatic encephalopathy EXAM: PORTABLE CHEST 1 VIEW COMPARISON:  05/31/2019 FINDINGS: Single frontal view of the chest demonstrates endotracheal tube overlying tracheal air column tip just below thoracic inlet. Enteric catheter tip and side port project over gastric body. Cardiac silhouette is unremarkable. Diffuse interstitial prominence throughout the lungs consistent with history of tobacco abuse, stable. No airspace disease, effusion, or pneumothorax. IMPRESSION: 1. Chronic interstitial lung disease, no acute process. 2. No complication after intubation. Electronically Signed   By: Sharlet Salina M.D.   On: 07/27/2019 19:44   DG Abd Portable 1V  Result Date: 07/27/2019 CLINICAL DATA:  Enteric catheter placement, hepatic  encephalopathy EXAM: PORTABLE ABDOMEN - 1 VIEW COMPARISON:  None. FINDINGS: Supine frontal view of the lower chest and upper abdomen demonstrates enteric catheter tip and side port projecting over gastric body. Bowel gas pattern is unremarkable. No bowel obstruction or ileus. No masses or abnormal calcifications. Lung bases are clear. IMPRESSION: 1. Enteric catheter overlying gastric body. Electronically Signed   By: Sharlet Salina M.D.   On: 07/27/2019 19:43   DG Hand Complete Right  Result Date: 07/27/2019 CLINICAL DATA:  Right hand injury EXAM: RIGHT HAND - COMPLETE 3+ VIEW COMPARISON:  05/30/2019 FINDINGS: Frontal, oblique, and lateral views of the right hand are obtained. Prior healed distal right radial fracture with resulting dorsal angulation of the radiocarpal joint unchanged. No acute displaced fractures. Likely previous healed fracture distal aspect right fifth metacarpal. There is radiocarpal joint space narrowing. Remaining joint spaces are relatively well preserved. Soft tissues appear normal. IMPRESSION: 1. Sequela of chronic healed distal right radial fracture, with stable dorsal angulation and radiocarpal osteoarthritis. 2. No acute bony abnormality of the right hand. Electronically Signed   By: Sharlet Salina M.D.   On: 07/27/2019 19:46       Subjective: No complaints. Diarrhea improved, resolved.   Discharge Exam: Vitals:   08/06/19 0429 08/06/19 0816  BP: (!) 112/57   Pulse: 81   Resp: 18   Temp: 98.1 F (36.7 C)   SpO2: 95% 96%   Vitals:  08/05/19 2017 08/06/19 0429 08/06/19 0512 08/06/19 0816  BP:  (!) 112/57    Pulse:  81    Resp:  18    Temp:  98.1 F (36.7 C)    TempSrc:  Oral    SpO2: 97% 95%  96%  Weight:   71.1 kg   Height:        General: Pt is alert, awake, not in acute distress Cardiovascular: RRR, S1/S2 +, no rubs, no gallops Respiratory: CTA bilaterally, no wheezing, no rhonchi Abdominal: Soft, NT, ND, bowel sounds + Extremities: no edema, no  cyanosis Neuro: aaxox3, no asterexis    The results of significant diagnostics from this hospitalization (including imaging, microbiology, ancillary and laboratory) are listed below for reference.     Microbiology: Recent Results (from the past 240 hour(s))  Respiratory Panel by RT PCR (Flu A&B, Covid) - Nasopharyngeal Swab     Status: None   Collection Time: 07/27/19  3:34 PM   Specimen: Nasopharyngeal Swab  Result Value Ref Range Status   SARS Coronavirus 2 by RT PCR NEGATIVE NEGATIVE Final    Comment: (NOTE) SARS-CoV-2 target nucleic acids are NOT DETECTED. The SARS-CoV-2 RNA is generally detectable in upper respiratoy specimens during the acute phase of infection. The lowest concentration of SARS-CoV-2 viral copies this assay can detect is 131 copies/mL. A negative result does not preclude SARS-Cov-2 infection and should not be used as the sole basis for treatment or other patient management decisions. A negative result may occur with  improper specimen collection/handling, submission of specimen other than nasopharyngeal swab, presence of viral mutation(s) within the areas targeted by this assay, and inadequate number of viral copies (<131 copies/mL). A negative result must be combined with clinical observations, patient history, and epidemiological information. The expected result is Negative. Fact Sheet for Patients:  https://www.moore.com/ Fact Sheet for Healthcare Providers:  https://www.young.biz/ This test is not yet ap proved or cleared by the [place] FDA and  has been authorized for detection and/or diagnosis of SARS-CoV-2 by FDA under an Emergency Use Authorization (EUA). This EUA will remain  in effect (meaning this test can be used) for the duration of the COVID-19 declaration under Section 564(b)(1) of the Act, 21 U.S.C. section 360bbb-3(b)(1), unless the authorization is terminated or revoked sooner.    Influenza A  by PCR NEGATIVE NEGATIVE Final   Influenza B by PCR NEGATIVE NEGATIVE Final    Comment: (NOTE) The Xpert Xpress SARS-CoV-2/FLU/RSV assay is intended as an aid in  the diagnosis of influenza from Nasopharyngeal swab specimens and  should not be used as a sole basis for treatment. Nasal washings and  aspirates are unacceptable for Xpert Xpress SARS-CoV-2/FLU/RSV  testing. Fact Sheet for Patients: https://www.moore.com/ Fact Sheet for Healthcare Providers: https://www.young.biz/ This test is not yet approved or cleared by the [place] FDA and  has been authorized for detection and/or diagnosis of SARS-CoV-2 by  FDA under an Emergency Use Authorization (EUA). This EUA will remain  in effect (meaning this test can be used) for the duration of the  Covid-19 declaration under Section 564(b)(1) of the Act, 21  U.S.C. section 360bbb-3(b)(1), unless the authorization is  terminated or revoked. Performed at [facility], [address] [place], [place] [address]   MRSA PCR Screening     Status: None   Collection Time: 07/27/19  5:39 PM   Specimen: Nasopharyngeal Swab  Result Value Ref Range Status   MRSA by PCR NEGATIVE NEGATIVE Final    Comment:  The GeneXpert MRSA Assay (FDA approved for NASAL specimens only), is one component of a comprehensive MRSA colonization surveillance program. It is not intended to diagnose MRSA infection nor to guide or monitor treatment for MRSA infections. Performed at [facility], [address] [place], [place] [address]   Respiratory Panel by RT PCR (Flu A&B, Covid) - Nasopharyngeal Swab     Status: None   Collection Time: 07/27/19  5:39 PM   Specimen: Nasopharyngeal Swab  Result Value Ref Range Status   SARS Coronavirus 2 by RT PCR NEGATIVE NEGATIVE Final    Comment: (NOTE) SARS-CoV-2 target nucleic acids are NOT DETECTED. The SARS-CoV-2 RNA is generally detectable in upper  respiratoy specimens during the acute phase of infection. The lowest concentration of SARS-CoV-2 viral copies this assay can detect is 131 copies/mL. A negative result does not preclude SARS-Cov-2 infection and should not be used as the sole basis for treatment or other patient management decisions. A negative result may occur with  improper specimen collection/handling, submission of specimen other than nasopharyngeal swab, presence of viral mutation(s) within the areas targeted by this assay, and inadequate number of viral copies (<131 copies/mL). A negative result must be combined with clinical observations, patient history, and epidemiological information. The expected result is Negative. Fact Sheet for Patients:  https://www.moore.com/ Fact Sheet for Healthcare Providers:  https://www.young.biz/ This test is not yet ap proved or cleared by the [place] FDA and  has been authorized for detection and/or diagnosis of SARS-CoV-2 by FDA under an Emergency Use Authorization (EUA). This EUA will remain  in effect (meaning this test can be used) for the duration of the COVID-19 declaration under Section 564(b)(1) of the Act, 21 U.S.C. section 360bbb-3(b)(1), unless the authorization is terminated or revoked sooner.    Influenza A by PCR NEGATIVE NEGATIVE Final   Influenza B by PCR NEGATIVE NEGATIVE Final    Comment: (NOTE) The Xpert Xpress SARS-CoV-2/FLU/RSV assay is intended as an aid in  the diagnosis of influenza from Nasopharyngeal swab specimens and  should not be used as a sole basis for treatment. Nasal washings and  aspirates are unacceptable for Xpert Xpress SARS-CoV-2/FLU/RSV  testing. Fact Sheet for Patients: https://www.moore.com/ Fact Sheet for Healthcare Providers: https://www.young.biz/ This test is not yet approved or cleared by the [place] FDA and  has been authorized for  detection and/or diagnosis of SARS-CoV-2 by  FDA under an Emergency Use Authorization (EUA). This EUA will remain  in effect (meaning this test can be used) for the duration of the  Covid-19 declaration under Section 564(b)(1) of the Act, 21  U.S.C. section 360bbb-3(b)(1), unless the authorization is  terminated or revoked. Performed at [facility], [address] [place], [place] [address]   CULTURE, BLOOD (ROUTINE X 2) w Reflex to ID Panel     Status: None   Collection Time: 07/27/19  7:29 PM   Specimen: BLOOD  Result Value Ref Range Status   Specimen Description BLOOD BLOOD RIGHT HAND  Final   Special Requests   Final    BOTTLES DRAWN AEROBIC AND ANAEROBIC Blood Culture adequate volume   Culture   Final    NO GROWTH 5 DAYS Performed at [facility], [address] [place], [place] [address]    Report Status 08/01/2019 FINAL  Final  Culture, respiratory (non-expectorated)     Status: None   Collection Time: 07/27/19  7:55 PM   Specimen: Tracheal Aspirate; Respiratory  Result Value Ref Range Status  Specimen Description   Final    TRACHEAL ASPIRATE Performed at [facility], [address] [place], [place] [address]    Special Requests NONE  Final   Gram Stain   Final    FEW WBC PRESENT, PREDOMINANTLY PMN RARE SQUAMOUS EPITHELIAL CELLS PRESENT ABUNDANT GRAM POSITIVE COCCI MODERATE GRAM POSITIVE RODS    Culture   Final    Consistent with normal respiratory flora. Performed at [facility], [place] [address], [place], [place] [address]    Report Status 07/29/2019 FINAL  Final  Culture, blood (Routine X 2) w Reflex to ID Panel     Status: None   Collection Time: 07/27/19 11:59 PM   Specimen: BLOOD  Result Value Ref Range Status   Specimen Description BLOOD LEFT HAND  Final   Special Requests   Final    BOTTLES DRAWN AEROBIC AND ANAEROBIC Blood Culture adequate volume   Culture   Final    NO GROWTH 5 DAYS Performed at [facility], [address] [place], [place] [address]    Report Status 08/02/2019 FINAL  Final  CULTURE, BLOOD (ROUTINE X 2) w Reflex to ID Panel     Status: None (Preliminary result)   Collection Time: 08/04/19  4:45 PM   Specimen: BLOOD  Result Value Ref Range Status   Specimen Description BLOOD RAC  Final   Special Requests   Final    BOTTLES DRAWN AEROBIC AND ANAEROBIC Blood Culture results may not be optimal due to an excessive volume of blood received in culture bottles   Culture   Final    NO GROWTH 2 DAYS Performed at [facility], [address] [place], [place] [address]    Report Status PENDING  Incomplete  CULTURE, BLOOD (ROUTINE X 2) w Reflex to ID Panel     Status: None (Preliminary result)   Collection Time: 08/04/19  4:52 PM   Specimen: BLOOD  Result Value Ref Range Status   Specimen Description BLOOD RW  Final   Special Requests   Final    BOTTLES DRAWN AEROBIC AND ANAEROBIC Blood Culture adequate volume   Culture   Final    NO GROWTH 2 DAYS Performed at [facility], [address] [place], [place] [address]    Report Status PENDING  Incomplete  C difficile quick scan w PCR reflex     Status: Abnormal   Collection Time: 08/04/19  6:02 PM   Specimen: STOOL  Result Value Ref Range Status   C Diff antigen POSITIVE (A) NEGATIVE Final   C Diff toxin NEGATIVE NEGATIVE Final   C Diff interpretation Results are indeterminate. See PCR results.  Final    Comment: Performed at [facility], [address] [place], [place] [address]  C. Diff by PCR, Reflexed     Status: Abnormal   Collection Time: 08/04/19  6:02 PM  Result Value Ref Range Status   Toxigenic C. Difficile by PCR POSITIVE (A) NEGATIVE Final    Comment: Positive for toxigenic C. difficile with little to no toxin production. Only treat if clinical presentation suggests symptomatic illness. Performed at [facility], [address] [place],  [address]       Labs: BNP (last 3 results) Recent Labs    05/31/19 0450  BNP 37.6   Basic Metabolic Panel: Recent Labs  Lab 07/31/19 0012 07/31/19 0012 08/01/19 0540 08/01/19 0540 08/02/19 2831 08/03/19 0505 08/04/19 5176 08/05/19 0552 08/06/19 0534  NA  141   < > 138  --  133* 136 135  --  133*  K 3.6   < > 3.0*   < > 3.6 3.6 3.2* 3.9 3.8  CL 112*   < > 101  --  101 105 103  --  101  CO2 21*   < > 29  --  21* 21* 19*  --  26  GLUCOSE 126*   < > 123*  --  115* 98 90  --  99  BUN 11   < > 6  --  7 8 11   --  13  CREATININE 0.82   < > 0.87  --  0.86 0.88 0.76  --  0.66  CALCIUM 7.7*   < > 7.9*  --  8.0* 8.1* 8.2*  --  8.7*  MG 2.0  --  1.5*  --  1.9 2.2 2.0  --   --   PHOS 2.4*  --  2.9  --  3.1 3.5 3.3  --   --    < > = values in this interval not displayed.   Liver Function Tests: Recent Labs  Lab 08/01/19 0540 08/03/19 0505 08/04/19 0619  ALBUMIN 2.6* 2.6* 2.5*   No results for input(s): LIPASE, AMYLASE in the last 168 hours. No results for input(s): AMMONIA in the last 168 hours. CBC: Recent Labs  Lab 07/31/19 0012 07/31/19 0751 08/01/19 0540 08/01/19 1214 08/02/19 0639 08/02/19 2345 08/03/19 0505 08/04/19 0619 08/05/19 0552  WBC 8.9  --  10.8*  --   --  10.5 11.4* 10.2 8.1  NEUTROABS 7.0  --   --   --   --  8.0*  --   --   --   HGB 9.4*   < > 9.6*   < > 8.8* 9.1* 9.0* 8.9* 8.7*  HCT 27.8*   < > 27.6*   < > 25.2* 26.5* 26.8* 26.2* 26.1*  MCV 94.2  --  92.0  --   --  92.0 93.4 92.6 93.5  PLT 129*  --  180  --   --  200 219 248 301   < > = values in this interval not displayed.   Cardiac Enzymes: No results for input(s): CKTOTAL, CKMB, CKMBINDEX, TROPONINI in the last 168 hours. BNP: Invalid input(s): POCBNP CBG: Recent Labs  Lab 08/05/19 0454 08/05/19 0812 08/05/19 1153 08/05/19 1700 08/05/19 2040  GLUCAP 106* 123* 119* 113* 116*   D-Dimer No results for input(s): DDIMER in the last 72 hours. Hgb A1c No results for input(s): HGBA1C in the last 72  hours. Lipid Profile No results for input(s): CHOL, HDL, LDLCALC, TRIG, CHOLHDL, LDLDIRECT in the last 72 hours. Thyroid function studies No results for input(s): TSH, T4TOTAL, T3FREE, THYROIDAB in the last 72 hours.  Invalid input(s): FREET3 Anemia work up No results for input(s): VITAMINB12, FOLATE, FERRITIN, TIBC, IRON, RETICCTPCT in the last 72 hours. Urinalysis    Component Value Date/Time   COLORURINE YELLOW (A) 08/03/2019 1100   APPEARANCEUR CLEAR (A) 08/03/2019 1100   APPEARANCEUR Clear 10/13/2012 0351   LABSPEC 1.004 (L) 08/03/2019 1100   LABSPEC 1.003 10/13/2012 0351   PHURINE 6.0 08/03/2019 1100   GLUCOSEU NEGATIVE 08/03/2019 1100   GLUCOSEU Negative 10/13/2012 0351   HGBUR NEGATIVE 08/03/2019 1100   BILIRUBINUR NEGATIVE 08/03/2019 1100   BILIRUBINUR Negative 10/13/2012 0351   KETONESUR NEGATIVE 08/03/2019 1100   PROTEINUR NEGATIVE 08/03/2019 1100   NITRITE NEGATIVE 08/03/2019 1100   LEUKOCYTESUR NEGATIVE 08/03/2019  1100   LEUKOCYTESUR Negative 10/13/2012 0351   Sepsis Labs Invalid input(s): PROCALCITONIN,  WBC,  LACTICIDVEN Microbiology Recent Results (from the past 240 hour(s))  Respiratory Panel by RT PCR (Flu A&B, Covid) - Nasopharyngeal Swab     Status: None   Collection Time: 07/27/19  3:34 PM   Specimen: Nasopharyngeal Swab  Result Value Ref Range Status   SARS Coronavirus 2 by RT PCR NEGATIVE NEGATIVE Final    Comment: (NOTE) SARS-CoV-2 target nucleic acids are NOT DETECTED. The SARS-CoV-2 RNA is generally detectable in upper respiratoy specimens during the acute phase of infection. The lowest concentration of SARS-CoV-2 viral copies this assay can detect is 131 copies/mL. A negative result does not preclude SARS-Cov-2 infection and should not be used as the sole basis for treatment or other patient management decisions. A negative result may occur with  improper specimen collection/handling, submission of specimen other than nasopharyngeal swab,  presence of viral mutation(s) within the areas targeted by this assay, and inadequate number of viral copies (<131 copies/mL). A negative result must be combined with clinical observations, patient history, and epidemiological information. The expected result is Negative. Fact Sheet for Patients:  https://www.moore.com/ Fact Sheet for Healthcare Providers:  https://www.young.biz/ This test is not yet ap proved or cleared by the [place] FDA and  has been authorized for detection and/or diagnosis of SARS-CoV-2 by FDA under an Emergency Use Authorization (EUA). This EUA will remain  in effect (meaning this test can be used) for the duration of the COVID-19 declaration under Section 564(b)(1) of the Act, 21 U.S.C. section 360bbb-3(b)(1), unless the authorization is terminated or revoked sooner.    Influenza A by PCR NEGATIVE NEGATIVE Final   Influenza B by PCR NEGATIVE NEGATIVE Final    Comment: (NOTE) The Xpert Xpress SARS-CoV-2/FLU/RSV assay is intended as an aid in  the diagnosis of influenza from Nasopharyngeal swab specimens and  should not be used as a sole basis for treatment. Nasal washings and  aspirates are unacceptable for Xpert Xpress SARS-CoV-2/FLU/RSV  testing. Fact Sheet for Patients: https://www.moore.com/ Fact Sheet for Healthcare Providers: https://www.young.biz/ This test is not yet approved or cleared by the [place] FDA and  has been authorized for detection and/or diagnosis of SARS-CoV-2 by  FDA under an Emergency Use Authorization (EUA). This EUA will remain  in effect (meaning this test can be used) for the duration of the  Covid-19 declaration under Section 564(b)(1) of the Act, 21  U.S.C. section 360bbb-3(b)(1), unless the authorization is  terminated or revoked. Performed at [facility], [address] [place], [place] [address]   MRSA PCR Screening      Status: None   Collection Time: 07/27/19  5:39 PM   Specimen: Nasopharyngeal Swab  Result Value Ref Range Status   MRSA by PCR NEGATIVE NEGATIVE Final    Comment:        The GeneXpert MRSA Assay (FDA approved for NASAL specimens only), is one component of a comprehensive MRSA colonization surveillance program. It is not intended to diagnose MRSA infection nor to guide or monitor treatment for MRSA infections. Performed at [facility], [address] [place], [place] [address]   Respiratory Panel by RT PCR (Flu A&B, Covid) - Nasopharyngeal Swab     Status: None   Collection Time: 07/27/19  5:39 PM   Specimen: Nasopharyngeal Swab  Result Value Ref Range Status   SARS Coronavirus 2 by RT PCR NEGATIVE NEGATIVE Final    Comment: (NOTE) SARS-CoV-2 target  nucleic acids are NOT DETECTED. The SARS-CoV-2 RNA is generally detectable in upper respiratoy specimens during the acute phase of infection. The lowest concentration of SARS-CoV-2 viral copies this assay can detect is 131 copies/mL. A negative result does not preclude SARS-Cov-2 infection and should not be used as the sole basis for treatment or other patient management decisions. A negative result may occur with  improper specimen collection/handling, submission of specimen other than nasopharyngeal swab, presence of viral mutation(s) within the areas targeted by this assay, and inadequate number of viral copies (<131 copies/mL). A negative result must be combined with clinical observations, patient history, and epidemiological information. The expected result is Negative. Fact Sheet for Patients:  https://www.moore.com/ Fact Sheet for Healthcare Providers:  https://www.young.biz/ This test is not yet ap proved or cleared by the [place] FDA and  has been authorized for detection and/or diagnosis of SARS-CoV-2 by FDA under an Emergency Use Authorization (EUA). This EUA will  remain  in effect (meaning this test can be used) for the duration of the COVID-19 declaration under Section 564(b)(1) of the Act, 21 U.S.C. section 360bbb-3(b)(1), unless the authorization is terminated or revoked sooner.    Influenza A by PCR NEGATIVE NEGATIVE Final   Influenza B by PCR NEGATIVE NEGATIVE Final    Comment: (NOTE) The Xpert Xpress SARS-CoV-2/FLU/RSV assay is intended as an aid in  the diagnosis of influenza from Nasopharyngeal swab specimens and  should not be used as a sole basis for treatment. Nasal washings and  aspirates are unacceptable for Xpert Xpress SARS-CoV-2/FLU/RSV  testing. Fact Sheet for Patients: https://www.moore.com/ Fact Sheet for Healthcare Providers: https://www.young.biz/ This test is not yet approved or cleared by the [place] FDA and  has been authorized for detection and/or diagnosis of SARS-CoV-2 by  FDA under an Emergency Use Authorization (EUA). This EUA will remain  in effect (meaning this test can be used) for the duration of the  Covid-19 declaration under Section 564(b)(1) of the Act, 21  U.S.C. section 360bbb-3(b)(1), unless the authorization is  terminated or revoked. Performed at [facility], [address] [place], [place] [address]   CULTURE, BLOOD (ROUTINE X 2) w Reflex to ID Panel     Status: None   Collection Time: 07/27/19  7:29 PM   Specimen: BLOOD  Result Value Ref Range Status   Specimen Description BLOOD BLOOD RIGHT HAND  Final   Special Requests   Final    BOTTLES DRAWN AEROBIC AND ANAEROBIC Blood Culture adequate volume   Culture   Final    NO GROWTH 5 DAYS Performed at [facility], [address] [place], [place] [address]    Report Status 08/01/2019 FINAL  Final  Culture, respiratory (non-expectorated)     Status: None   Collection Time: 07/27/19  7:55 PM   Specimen: Tracheal Aspirate; Respiratory  Result Value Ref Range Status   Specimen  Description   Final    TRACHEAL ASPIRATE Performed at [facility], [address] [place], [place] [address]    Special Requests NONE  Final   Gram Stain   Final    FEW WBC PRESENT, PREDOMINANTLY PMN RARE SQUAMOUS EPITHELIAL CELLS PRESENT ABUNDANT GRAM POSITIVE COCCI MODERATE GRAM POSITIVE RODS    Culture   Final    Consistent with normal respiratory flora. Performed at [facility], [address] [place], [place] [address]    Report Status 07/29/2019 FINAL  Final  Culture, blood (Routine X 2) w Reflex to  ID Panel     Status: None   Collection Time: 07/27/19 11:59 PM   Specimen: BLOOD  Result Value Ref Range Status   Specimen Description BLOOD LEFT HAND  Final   Special Requests   Final    BOTTLES DRAWN AEROBIC AND ANAEROBIC Blood Culture adequate volume   Culture   Final    NO GROWTH 5 DAYS Performed at [facility], [address] [place], [place] [address]    Report Status 08/02/2019 FINAL  Final  CULTURE, BLOOD (ROUTINE X 2) w Reflex to ID Panel     Status: None (Preliminary result)   Collection Time: 08/04/19  4:45 PM   Specimen: BLOOD  Result Value Ref Range Status   Specimen Description BLOOD RAC  Final   Special Requests   Final    BOTTLES DRAWN AEROBIC AND ANAEROBIC Blood Culture results may not be optimal due to an excessive volume of blood received in culture bottles   Culture   Final    NO GROWTH 2 DAYS Performed at [facility] [place] [facility], [address], [place], [place] [address]    Report Status PENDING  Incomplete  CULTURE, BLOOD (ROUTINE X 2) w Reflex to ID Panel     Status: None (Preliminary result)   Collection Time: 08/04/19  4:52 PM   Specimen: BLOOD  Result Value Ref Range Status   Specimen Description BLOOD RW  Final   Special Requests   Final    BOTTLES DRAWN AEROBIC AND ANAEROBIC Blood Culture adequate volume   Culture   Final    NO GROWTH 2 DAYS Performed at [facility], [address],  [place], [place] [address]    Report Status PENDING  Incomplete  C difficile quick scan w PCR reflex     Status: Abnormal   Collection Time: 08/04/19  6:02 PM   Specimen: STOOL  Result Value Ref Range Status   C Diff antigen POSITIVE (A) NEGATIVE Final   C Diff toxin NEGATIVE NEGATIVE Final   C Diff interpretation Results are indeterminate. See PCR results.  Final    Comment: Performed at [facility], [address] [place], [place] [address]  C. Diff by PCR, Reflexed     Status: Abnormal   Collection Time: 08/04/19  6:02 PM  Result Value Ref Range Status   Toxigenic C. Difficile by PCR POSITIVE (A) NEGATIVE Final    Comment: Positive for toxigenic C. difficile with little to no toxin production. Only treat if clinical presentation suggests symptomatic illness. Performed at [facility], [address] [place], [place] [address]    Acute Hypoxic Respiratory Failure Aspiration pneumonia Due to aspiration of bloody vomitus/epistaxis Status post emergent endotracheal intubation Extubated 3/01 Continue  bronchodilator  Continue Unasyn for aspiration pneumonia Wean off oxygen as tolerated   Acute encephalopathy Alcohol withdrawal Query sundowning Element of hepatic encephalopathy-resolved Ammonia level has responded to interventions CIWA for alcohol withdrawal Phenobarbital for withdrawal/agitation-weaned off Substitute Valium for Ativan for CIWA Discontinued Precedex  Seroquel at evening time due to questionable sundowning  Fever/diarrhea C.diff +. Started on vancomycin po, needs to complete total 10days     Acute upper GI bleed Continue Protonix 40 mg twice daily GI consultants recommended medical management only H&H stable Continue to monitor Lactulose for elevated ammonia levels    Transaminitis Alcoholic hepatitis Supportive care  Moderate protein calorie malnutrition Passedswallow evaluation by speech path Advance diet as  recommended Electrolyte replacement as necessary   Alcoholism/substance abuse High-dose  thiamine MVi  Time coordinating discharge: Over 30 minutes  SIGNED:   Lynn Ito, MD  [facility] 08/06/2019, 10:02 AM Pager   If 7PM-7AM, please contact night-coverage [email or web address] Password [email or web address]

## 2019-08-06 NOTE — Progress Notes (Signed)
Joel Tanner.  A and O x 4. VSS. Pt tolerating diet well. No complaints of pain or nausea. IV removed intact, prescriptions given. Pt voiced understanding of discharge instructions with no further questions. Pt discharged home.    Allergies as of 08/06/2019   No Known Allergies     Medication List    STOP taking these medications   omeprazole 20 MG capsule Commonly known as: PRILOSEC     TAKE these medications   albuterol 108 (90 Base) MCG/ACT inhaler Commonly known as: VENTOLIN HFA Inhale 2 puffs into the lungs every 6 (six) hours as needed for wheezing or shortness of breath.   atorvastatin 40 MG tablet Commonly known as: LIPITOR Take 40 mg by mouth daily.   B-100 B-COMPLEX PO Take 1 tablet by mouth daily.   disulfiram 250 MG tablet Commonly known as: ANTABUSE Take 250 mg by mouth daily.   folic acid 1 MG tablet Commonly known as: FOLVITE Take 1 tablet (1 mg total) by mouth daily.   pantoprazole 40 MG tablet Commonly known as: PROTONIX Take 1 tablet (40 mg total) by mouth 2 (two) times daily.   tadalafil 20 MG tablet Commonly known as: CIALIS Take 20 mg by mouth daily as needed for erectile dysfunction.   vancomycin 125 MG capsule Commonly known as: Vancocin HCl Take 1 capsule (125 mg total) by mouth 4 (four) times daily for 9 days.   venlafaxine XR 150 MG 24 hr capsule Commonly known as: EFFEXOR-XR Take 150 mg by mouth daily.       Vitals:   08/06/19 0816 08/06/19 1124  BP:  128/66  Pulse:  94  Resp:  18  Temp:  98.4 F (36.9 C)  SpO2: 96% 97%    Suzzanne Cloud

## 2019-08-09 LAB — CULTURE, BLOOD (ROUTINE X 2)
Culture: NO GROWTH
Culture: NO GROWTH
Special Requests: ADEQUATE

## 2019-10-05 ENCOUNTER — Telehealth: Payer: Self-pay | Admitting: Pharmacist

## 2019-10-05 NOTE — Telephone Encounter (Signed)
Patient failed to provide requested 2021 financial documentation. No additional medication assistance will be provided by [facility] without the required proof of income documentation. Patient notified by letter Debra Cheek Administrative Assistant [facility] 

## 2021-10-18 IMAGING — CT CT HEAD W/O CM
3 series · 16 of 47 positions shown, 19 images · non-contrast
Comparison: CT dated 07/22/2015.

CLINICAL DATA: Altered mental status

EXAM:
CT HEAD WITHOUT CONTRAST
TECHNIQUE: Contiguous axial images were obtained from the base of the skull
through the vertex without intravenous contrast.

[Series 3: head wo · axial · 0.40mm/px · z∈[-74,+51]mm · 10 of 31 slices shown, 13 images]
[im 3/31  brain]
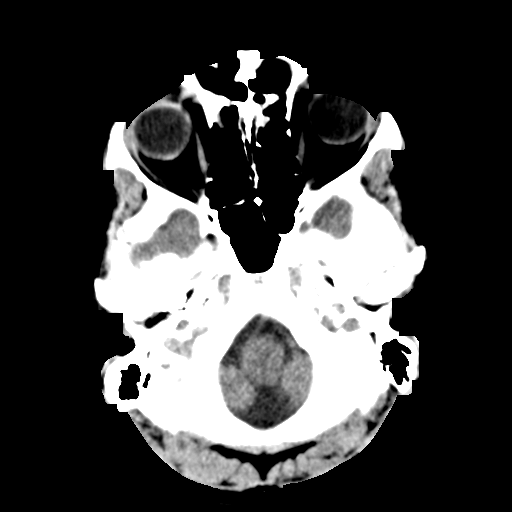
[im 3/31  bone]
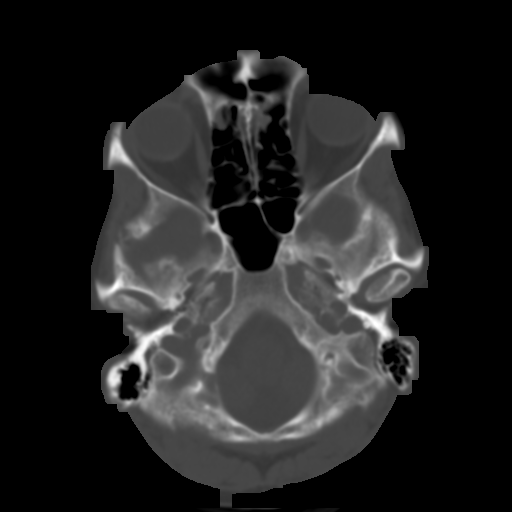
[im 6/31  brain]
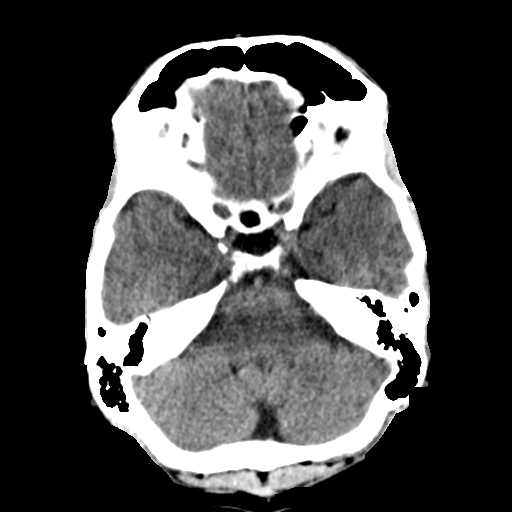
[im 9/31  brain]
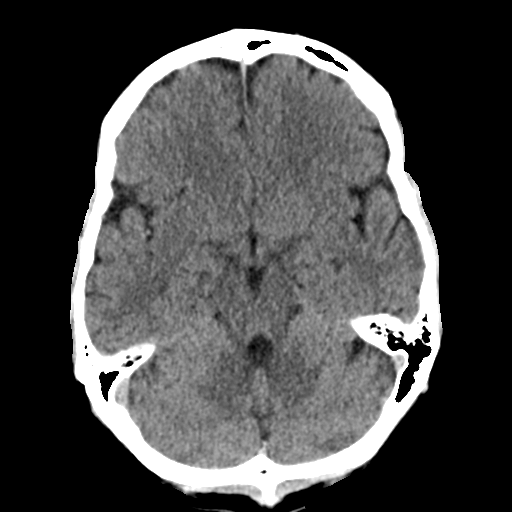
[im 11/31  brain]
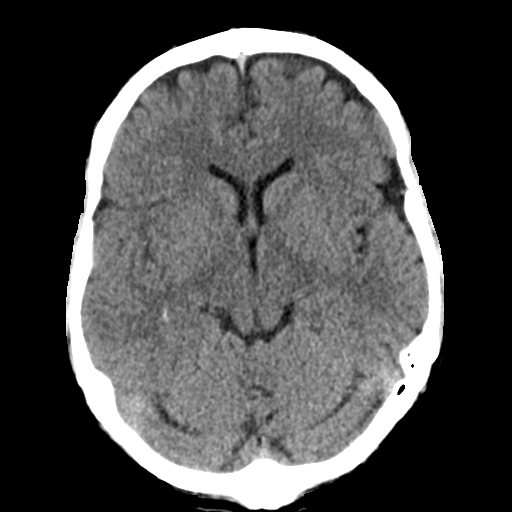
[im 14/31  brain]
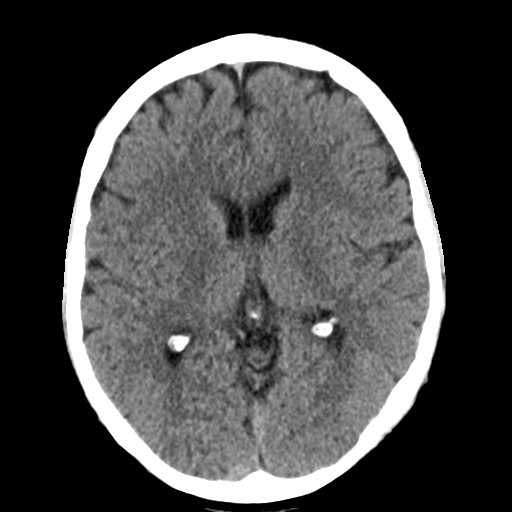
[im 14/31  bone]
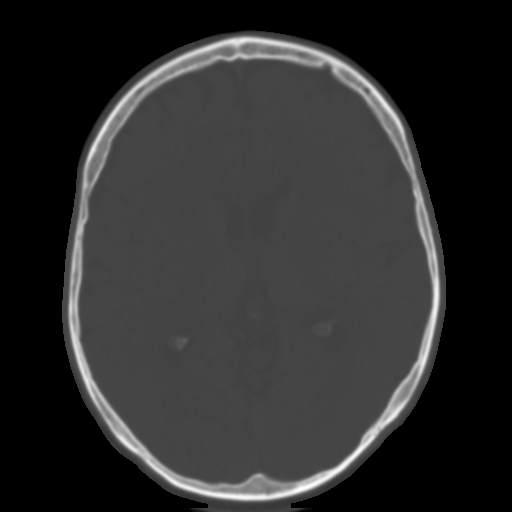
[im 17/31  brain]
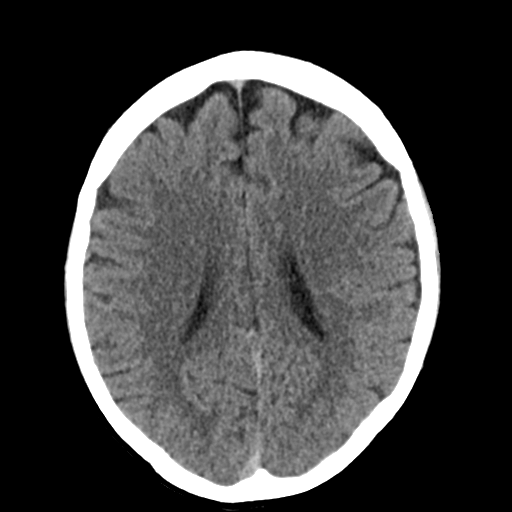
[im 20/31  brain]
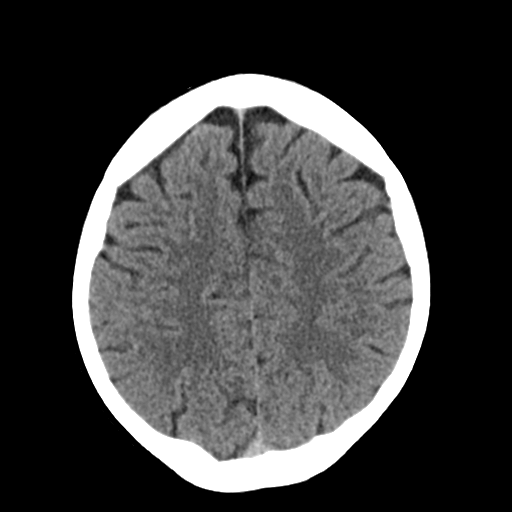
[im 23/31  brain]
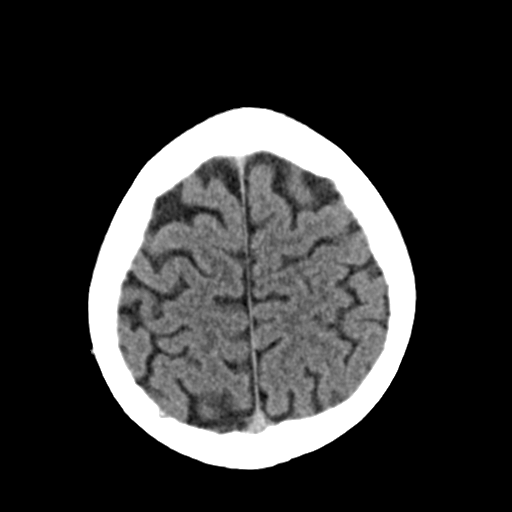
[im 25/31  brain]
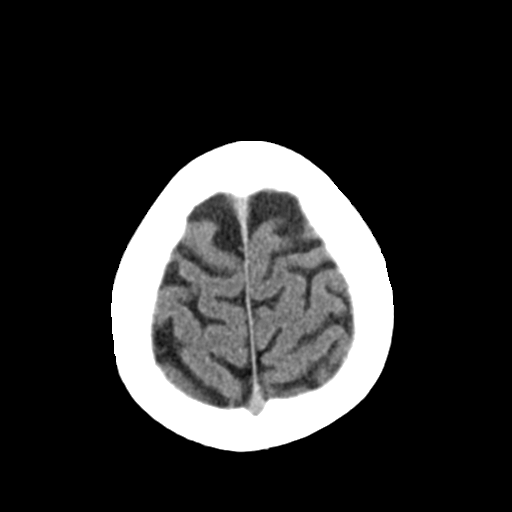
[im 25/31  bone]
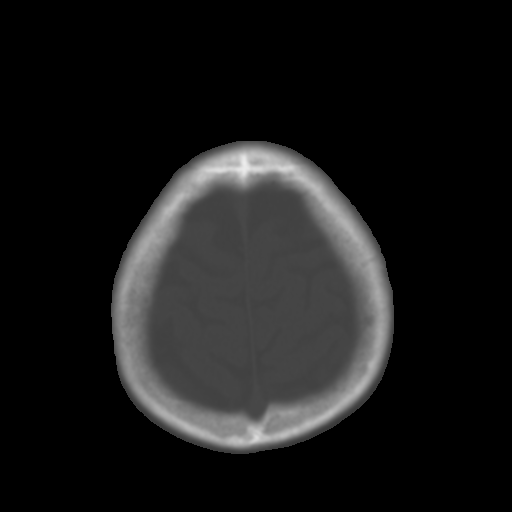
[im 28/31  brain]
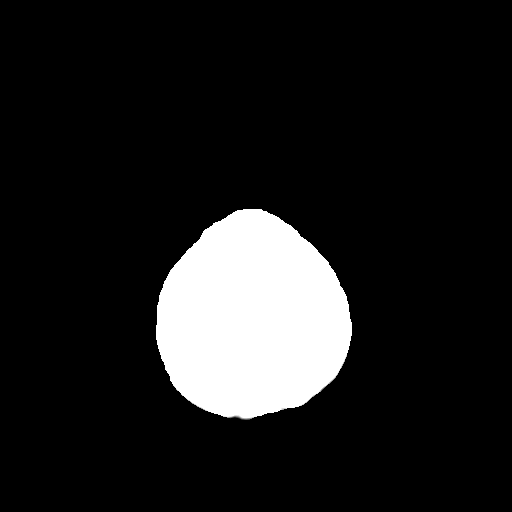

[Series 4: coronal soft tissue · coronal · 0.30mm/px · 3 of 66 slices shown]
[im 22/66  brain]
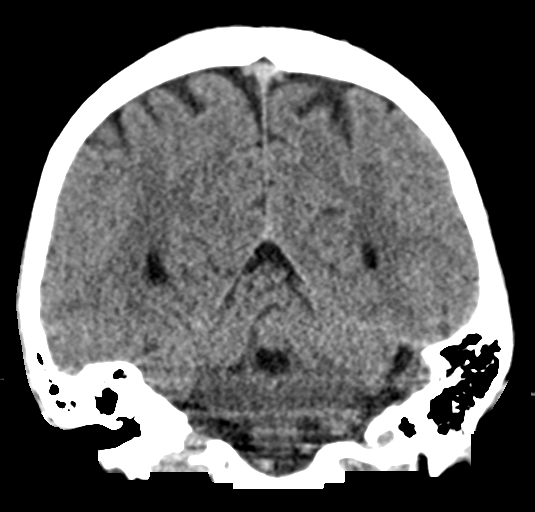
[im 29/66  brain]
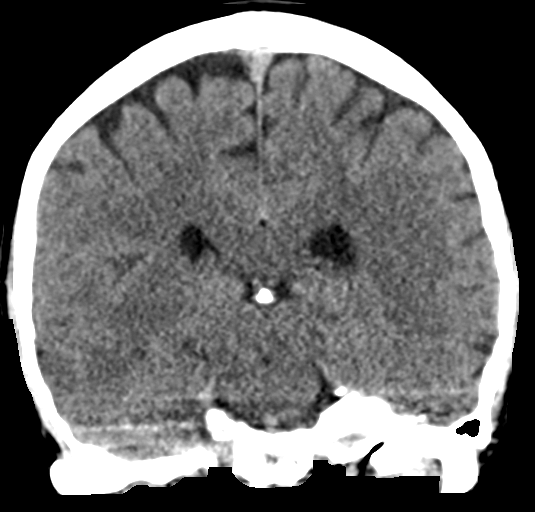
[im 37/66  brain]
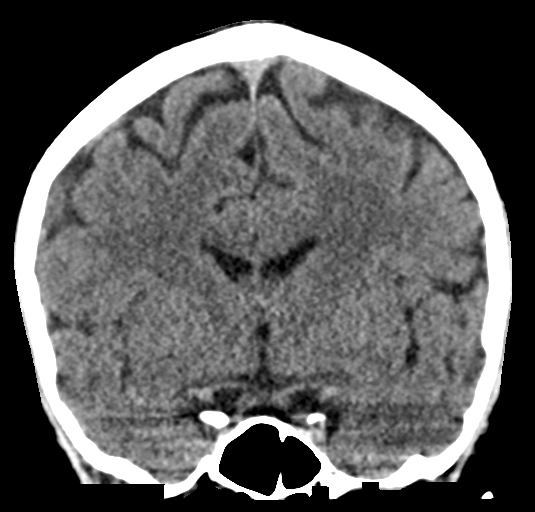

[Series 5: sagittal soft tissue · sagittal · 0.30mm/px · 3 of 55 slices shown]
[im 19/55  brain]
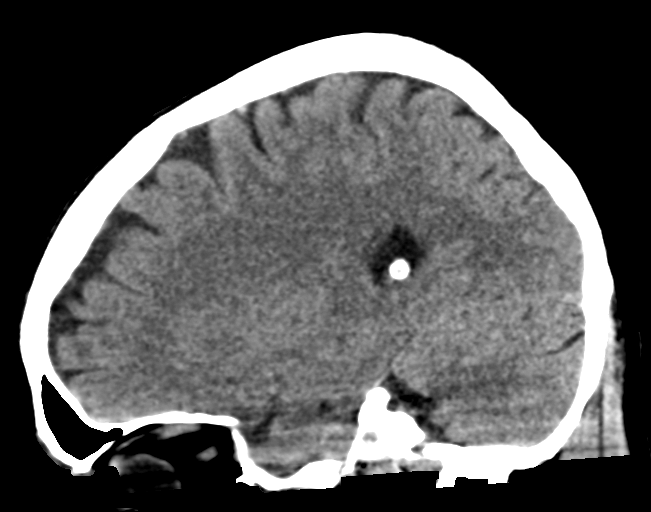
[im 28/55  brain]
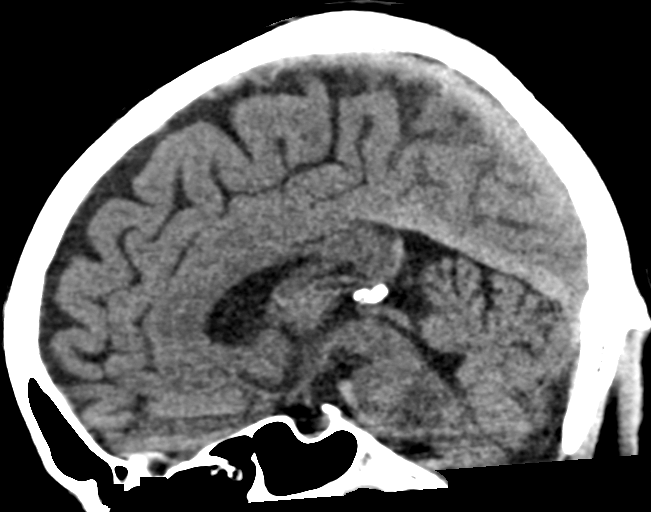
[im 37/55  brain]
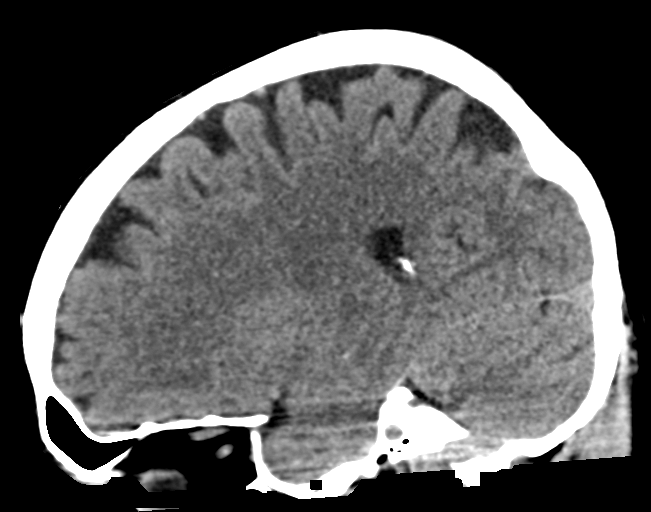

[16 of 47 positions shown; findings below may reference images not displayed]

FINDINGS: Brain: No evidence of acute infarction, hemorrhage, hydrocephalus,
extra-axial collection or mass lesion/mass effect.

Vascular: No hyperdense vessel or unexpected calcification.

Skull: Normal. Negative for fracture or focal lesion.

Sinuses/Orbits: No acute finding.

Other: None.
IMPRESSION: No acute intracranial abnormality.
